# Patient Record
Sex: Female | Born: 1950
Health system: Southern US, Community
[De-identification: ages and names within clinical notes are randomized; demographics above are authoritative.]

## PROBLEM LIST (undated history)

## (undated) DIAGNOSIS — I10 Essential (primary) hypertension: Secondary | ICD-10-CM

## (undated) DIAGNOSIS — I350 Nonrheumatic aortic (valve) stenosis: Secondary | ICD-10-CM

## (undated) DIAGNOSIS — E785 Hyperlipidemia, unspecified: Secondary | ICD-10-CM

## (undated) DIAGNOSIS — I509 Heart failure, unspecified: Secondary | ICD-10-CM

## (undated) DIAGNOSIS — E119 Type 2 diabetes mellitus without complications: Secondary | ICD-10-CM

## (undated) HISTORY — DX: Hyperlipidemia, unspecified: E78.5

## (undated) HISTORY — PX: CATARACT EXTRACTION: SUR2

## (undated) HISTORY — DX: Nonrheumatic aortic (valve) stenosis: I35.0

## (undated) HISTORY — DX: Heart failure, unspecified: I50.9

---

## 1983-05-30 HISTORY — PX: TUBAL LIGATION: SHX77

## 2007-05-17 ENCOUNTER — Other Ambulatory Visit: Admission: RE | Admit: 2007-05-17 | Discharge: 2007-05-17 | Payer: Self-pay | Admitting: Family Medicine

## 2007-05-17 ENCOUNTER — Encounter: Admission: RE | Admit: 2007-05-17 | Discharge: 2007-05-28 | Payer: Self-pay | Admitting: Family Medicine

## 2007-05-31 ENCOUNTER — Encounter: Admission: RE | Admit: 2007-05-31 | Discharge: 2007-07-09 | Payer: Self-pay | Admitting: Family Medicine

## 2016-09-07 DIAGNOSIS — E113411 Type 2 diabetes mellitus with severe nonproliferative diabetic retinopathy with macular edema, right eye: Secondary | ICD-10-CM | POA: Diagnosis not present

## 2016-09-07 DIAGNOSIS — E113412 Type 2 diabetes mellitus with severe nonproliferative diabetic retinopathy with macular edema, left eye: Secondary | ICD-10-CM | POA: Diagnosis not present

## 2016-09-28 DIAGNOSIS — E113411 Type 2 diabetes mellitus with severe nonproliferative diabetic retinopathy with macular edema, right eye: Secondary | ICD-10-CM | POA: Diagnosis not present

## 2016-10-11 DIAGNOSIS — R51 Headache: Secondary | ICD-10-CM | POA: Diagnosis not present

## 2016-10-11 DIAGNOSIS — H5711 Ocular pain, right eye: Secondary | ICD-10-CM | POA: Diagnosis not present

## 2016-10-19 DIAGNOSIS — E113412 Type 2 diabetes mellitus with severe nonproliferative diabetic retinopathy with macular edema, left eye: Secondary | ICD-10-CM | POA: Diagnosis not present

## 2016-11-02 DIAGNOSIS — E113411 Type 2 diabetes mellitus with severe nonproliferative diabetic retinopathy with macular edema, right eye: Secondary | ICD-10-CM | POA: Diagnosis not present

## 2016-11-15 DIAGNOSIS — E1165 Type 2 diabetes mellitus with hyperglycemia: Secondary | ICD-10-CM | POA: Diagnosis not present

## 2016-11-15 DIAGNOSIS — I38 Endocarditis, valve unspecified: Secondary | ICD-10-CM | POA: Diagnosis not present

## 2016-11-15 DIAGNOSIS — E1142 Type 2 diabetes mellitus with diabetic polyneuropathy: Secondary | ICD-10-CM | POA: Diagnosis not present

## 2016-11-15 DIAGNOSIS — E7800 Pure hypercholesterolemia, unspecified: Secondary | ICD-10-CM | POA: Diagnosis not present

## 2016-11-22 DIAGNOSIS — E113412 Type 2 diabetes mellitus with severe nonproliferative diabetic retinopathy with macular edema, left eye: Secondary | ICD-10-CM | POA: Diagnosis not present

## 2016-11-22 DIAGNOSIS — E113411 Type 2 diabetes mellitus with severe nonproliferative diabetic retinopathy with macular edema, right eye: Secondary | ICD-10-CM | POA: Diagnosis not present

## 2016-12-12 DIAGNOSIS — E113411 Type 2 diabetes mellitus with severe nonproliferative diabetic retinopathy with macular edema, right eye: Secondary | ICD-10-CM | POA: Diagnosis not present

## 2017-01-04 DIAGNOSIS — H34812 Central retinal vein occlusion, left eye, with macular edema: Secondary | ICD-10-CM | POA: Diagnosis not present

## 2017-01-18 DIAGNOSIS — H40059 Ocular hypertension, unspecified eye: Secondary | ICD-10-CM | POA: Diagnosis not present

## 2017-01-18 DIAGNOSIS — E113411 Type 2 diabetes mellitus with severe nonproliferative diabetic retinopathy with macular edema, right eye: Secondary | ICD-10-CM | POA: Diagnosis not present

## 2017-01-18 DIAGNOSIS — E113412 Type 2 diabetes mellitus with severe nonproliferative diabetic retinopathy with macular edema, left eye: Secondary | ICD-10-CM | POA: Diagnosis not present

## 2017-01-25 DIAGNOSIS — E1165 Type 2 diabetes mellitus with hyperglycemia: Secondary | ICD-10-CM | POA: Diagnosis not present

## 2017-01-25 DIAGNOSIS — E7800 Pure hypercholesterolemia, unspecified: Secondary | ICD-10-CM | POA: Diagnosis not present

## 2017-01-25 DIAGNOSIS — I38 Endocarditis, valve unspecified: Secondary | ICD-10-CM | POA: Diagnosis not present

## 2017-01-25 DIAGNOSIS — Z7984 Long term (current) use of oral hypoglycemic drugs: Secondary | ICD-10-CM | POA: Diagnosis not present

## 2017-01-25 DIAGNOSIS — E1142 Type 2 diabetes mellitus with diabetic polyneuropathy: Secondary | ICD-10-CM | POA: Diagnosis not present

## 2017-01-30 DIAGNOSIS — H40052 Ocular hypertension, left eye: Secondary | ICD-10-CM | POA: Diagnosis not present

## 2017-02-05 DIAGNOSIS — E113412 Type 2 diabetes mellitus with severe nonproliferative diabetic retinopathy with macular edema, left eye: Secondary | ICD-10-CM | POA: Diagnosis not present

## 2017-03-08 DIAGNOSIS — E113411 Type 2 diabetes mellitus with severe nonproliferative diabetic retinopathy with macular edema, right eye: Secondary | ICD-10-CM | POA: Diagnosis not present

## 2017-03-29 DIAGNOSIS — E113412 Type 2 diabetes mellitus with severe nonproliferative diabetic retinopathy with macular edema, left eye: Secondary | ICD-10-CM | POA: Diagnosis not present

## 2017-04-26 DIAGNOSIS — E113411 Type 2 diabetes mellitus with severe nonproliferative diabetic retinopathy with macular edema, right eye: Secondary | ICD-10-CM | POA: Diagnosis not present

## 2017-05-01 DIAGNOSIS — E113412 Type 2 diabetes mellitus with severe nonproliferative diabetic retinopathy with macular edema, left eye: Secondary | ICD-10-CM | POA: Diagnosis not present

## 2017-05-02 ENCOUNTER — Other Ambulatory Visit: Payer: Self-pay | Admitting: Family Medicine

## 2017-05-02 DIAGNOSIS — I38 Endocarditis, valve unspecified: Secondary | ICD-10-CM | POA: Diagnosis not present

## 2017-05-02 DIAGNOSIS — Z23 Encounter for immunization: Secondary | ICD-10-CM | POA: Diagnosis not present

## 2017-05-02 DIAGNOSIS — E1142 Type 2 diabetes mellitus with diabetic polyneuropathy: Secondary | ICD-10-CM | POA: Diagnosis not present

## 2017-05-02 DIAGNOSIS — R011 Cardiac murmur, unspecified: Secondary | ICD-10-CM

## 2017-05-02 DIAGNOSIS — E119 Type 2 diabetes mellitus without complications: Secondary | ICD-10-CM | POA: Diagnosis not present

## 2017-06-07 DIAGNOSIS — E113412 Type 2 diabetes mellitus with severe nonproliferative diabetic retinopathy with macular edema, left eye: Secondary | ICD-10-CM | POA: Diagnosis not present

## 2017-06-13 DIAGNOSIS — E113411 Type 2 diabetes mellitus with severe nonproliferative diabetic retinopathy with macular edema, right eye: Secondary | ICD-10-CM | POA: Diagnosis not present

## 2017-06-20 ENCOUNTER — Other Ambulatory Visit: Payer: Self-pay

## 2017-06-20 ENCOUNTER — Ambulatory Visit (HOSPITAL_COMMUNITY): Payer: Medicare HMO | Attending: Cardiovascular Disease

## 2017-06-20 DIAGNOSIS — R011 Cardiac murmur, unspecified: Secondary | ICD-10-CM | POA: Diagnosis not present

## 2017-06-20 DIAGNOSIS — I340 Nonrheumatic mitral (valve) insufficiency: Secondary | ICD-10-CM | POA: Insufficient documentation

## 2017-07-10 DIAGNOSIS — E113412 Type 2 diabetes mellitus with severe nonproliferative diabetic retinopathy with macular edema, left eye: Secondary | ICD-10-CM | POA: Diagnosis not present

## 2017-07-17 DIAGNOSIS — E113411 Type 2 diabetes mellitus with severe nonproliferative diabetic retinopathy with macular edema, right eye: Secondary | ICD-10-CM | POA: Diagnosis not present

## 2017-08-08 DIAGNOSIS — E113412 Type 2 diabetes mellitus with severe nonproliferative diabetic retinopathy with macular edema, left eye: Secondary | ICD-10-CM | POA: Diagnosis not present

## 2017-08-15 DIAGNOSIS — E113411 Type 2 diabetes mellitus with severe nonproliferative diabetic retinopathy with macular edema, right eye: Secondary | ICD-10-CM | POA: Diagnosis not present

## 2017-09-28 DIAGNOSIS — E113412 Type 2 diabetes mellitus with severe nonproliferative diabetic retinopathy with macular edema, left eye: Secondary | ICD-10-CM | POA: Diagnosis not present

## 2017-10-17 DIAGNOSIS — E113511 Type 2 diabetes mellitus with proliferative diabetic retinopathy with macular edema, right eye: Secondary | ICD-10-CM | POA: Diagnosis not present

## 2017-10-31 DIAGNOSIS — E113512 Type 2 diabetes mellitus with proliferative diabetic retinopathy with macular edema, left eye: Secondary | ICD-10-CM | POA: Diagnosis not present

## 2017-11-08 ENCOUNTER — Other Ambulatory Visit: Payer: Self-pay | Admitting: Family Medicine

## 2017-11-08 DIAGNOSIS — E7800 Pure hypercholesterolemia, unspecified: Secondary | ICD-10-CM | POA: Diagnosis not present

## 2017-11-08 DIAGNOSIS — R030 Elevated blood-pressure reading, without diagnosis of hypertension: Secondary | ICD-10-CM | POA: Diagnosis not present

## 2017-11-08 DIAGNOSIS — Z794 Long term (current) use of insulin: Secondary | ICD-10-CM | POA: Diagnosis not present

## 2017-11-08 DIAGNOSIS — Z23 Encounter for immunization: Secondary | ICD-10-CM | POA: Diagnosis not present

## 2017-11-08 DIAGNOSIS — E119 Type 2 diabetes mellitus without complications: Secondary | ICD-10-CM | POA: Diagnosis not present

## 2017-11-08 DIAGNOSIS — Z7984 Long term (current) use of oral hypoglycemic drugs: Secondary | ICD-10-CM | POA: Diagnosis not present

## 2017-11-08 DIAGNOSIS — Z1211 Encounter for screening for malignant neoplasm of colon: Secondary | ICD-10-CM | POA: Diagnosis not present

## 2017-11-08 DIAGNOSIS — E1142 Type 2 diabetes mellitus with diabetic polyneuropathy: Secondary | ICD-10-CM | POA: Diagnosis not present

## 2017-11-08 DIAGNOSIS — Z1231 Encounter for screening mammogram for malignant neoplasm of breast: Secondary | ICD-10-CM

## 2017-11-08 DIAGNOSIS — Z1239 Encounter for other screening for malignant neoplasm of breast: Secondary | ICD-10-CM | POA: Diagnosis not present

## 2017-11-22 DIAGNOSIS — E113511 Type 2 diabetes mellitus with proliferative diabetic retinopathy with macular edema, right eye: Secondary | ICD-10-CM | POA: Diagnosis not present

## 2017-12-04 DIAGNOSIS — E113512 Type 2 diabetes mellitus with proliferative diabetic retinopathy with macular edema, left eye: Secondary | ICD-10-CM | POA: Diagnosis not present

## 2017-12-11 DIAGNOSIS — E871 Hypo-osmolality and hyponatremia: Secondary | ICD-10-CM | POA: Diagnosis not present

## 2017-12-25 DIAGNOSIS — E113511 Type 2 diabetes mellitus with proliferative diabetic retinopathy with macular edema, right eye: Secondary | ICD-10-CM | POA: Diagnosis not present

## 2018-01-04 DIAGNOSIS — E113512 Type 2 diabetes mellitus with proliferative diabetic retinopathy with macular edema, left eye: Secondary | ICD-10-CM | POA: Diagnosis not present

## 2018-01-17 ENCOUNTER — Encounter: Payer: Self-pay | Admitting: Radiology

## 2018-01-17 ENCOUNTER — Ambulatory Visit
Admission: RE | Admit: 2018-01-17 | Discharge: 2018-01-17 | Disposition: A | Discharge status: Home / Self Care | Payer: Medicare HMO | Source: Ambulatory Visit | Attending: Family Medicine | Admitting: Family Medicine

## 2018-01-17 DIAGNOSIS — Z1231 Encounter for screening mammogram for malignant neoplasm of breast: Secondary | ICD-10-CM | POA: Diagnosis not present

## 2018-01-23 DIAGNOSIS — Z1211 Encounter for screening for malignant neoplasm of colon: Secondary | ICD-10-CM | POA: Diagnosis not present

## 2018-01-23 DIAGNOSIS — D126 Benign neoplasm of colon, unspecified: Secondary | ICD-10-CM | POA: Diagnosis not present

## 2018-01-23 DIAGNOSIS — K635 Polyp of colon: Secondary | ICD-10-CM | POA: Diagnosis not present

## 2018-01-25 DIAGNOSIS — D126 Benign neoplasm of colon, unspecified: Secondary | ICD-10-CM | POA: Diagnosis not present

## 2018-01-25 DIAGNOSIS — Z1211 Encounter for screening for malignant neoplasm of colon: Secondary | ICD-10-CM | POA: Diagnosis not present

## 2018-02-05 DIAGNOSIS — E113512 Type 2 diabetes mellitus with proliferative diabetic retinopathy with macular edema, left eye: Secondary | ICD-10-CM | POA: Diagnosis not present

## 2018-02-12 DIAGNOSIS — E113511 Type 2 diabetes mellitus with proliferative diabetic retinopathy with macular edema, right eye: Secondary | ICD-10-CM | POA: Diagnosis not present

## 2018-03-12 DIAGNOSIS — E113512 Type 2 diabetes mellitus with proliferative diabetic retinopathy with macular edema, left eye: Secondary | ICD-10-CM | POA: Diagnosis not present

## 2018-04-03 DIAGNOSIS — E113511 Type 2 diabetes mellitus with proliferative diabetic retinopathy with macular edema, right eye: Secondary | ICD-10-CM | POA: Diagnosis not present

## 2018-04-22 DIAGNOSIS — E113512 Type 2 diabetes mellitus with proliferative diabetic retinopathy with macular edema, left eye: Secondary | ICD-10-CM | POA: Diagnosis not present

## 2018-05-14 DIAGNOSIS — H524 Presbyopia: Secondary | ICD-10-CM | POA: Diagnosis not present

## 2018-05-14 DIAGNOSIS — H04123 Dry eye syndrome of bilateral lacrimal glands: Secondary | ICD-10-CM | POA: Diagnosis not present

## 2018-05-14 DIAGNOSIS — H40053 Ocular hypertension, bilateral: Secondary | ICD-10-CM | POA: Diagnosis not present

## 2018-05-14 DIAGNOSIS — H2513 Age-related nuclear cataract, bilateral: Secondary | ICD-10-CM | POA: Diagnosis not present

## 2018-05-16 DIAGNOSIS — E7800 Pure hypercholesterolemia, unspecified: Secondary | ICD-10-CM | POA: Diagnosis not present

## 2018-05-16 DIAGNOSIS — E11311 Type 2 diabetes mellitus with unspecified diabetic retinopathy with macular edema: Secondary | ICD-10-CM | POA: Diagnosis not present

## 2018-05-16 DIAGNOSIS — I10 Essential (primary) hypertension: Secondary | ICD-10-CM | POA: Diagnosis not present

## 2018-05-16 DIAGNOSIS — Z794 Long term (current) use of insulin: Secondary | ICD-10-CM | POA: Diagnosis not present

## 2018-05-16 DIAGNOSIS — R05 Cough: Secondary | ICD-10-CM | POA: Diagnosis not present

## 2018-05-16 DIAGNOSIS — E1142 Type 2 diabetes mellitus with diabetic polyneuropathy: Secondary | ICD-10-CM | POA: Diagnosis not present

## 2018-05-16 DIAGNOSIS — E1139 Type 2 diabetes mellitus with other diabetic ophthalmic complication: Secondary | ICD-10-CM | POA: Diagnosis not present

## 2018-05-16 DIAGNOSIS — E1149 Type 2 diabetes mellitus with other diabetic neurological complication: Secondary | ICD-10-CM | POA: Diagnosis not present

## 2018-05-24 DIAGNOSIS — E113511 Type 2 diabetes mellitus with proliferative diabetic retinopathy with macular edema, right eye: Secondary | ICD-10-CM | POA: Diagnosis not present

## 2018-06-04 DIAGNOSIS — E113512 Type 2 diabetes mellitus with proliferative diabetic retinopathy with macular edema, left eye: Secondary | ICD-10-CM | POA: Diagnosis not present

## 2018-06-25 DIAGNOSIS — H2513 Age-related nuclear cataract, bilateral: Secondary | ICD-10-CM | POA: Diagnosis not present

## 2018-06-25 DIAGNOSIS — H40053 Ocular hypertension, bilateral: Secondary | ICD-10-CM | POA: Diagnosis not present

## 2018-06-25 DIAGNOSIS — Z0100 Encounter for examination of eyes and vision without abnormal findings: Secondary | ICD-10-CM | POA: Diagnosis not present

## 2018-06-25 DIAGNOSIS — H04123 Dry eye syndrome of bilateral lacrimal glands: Secondary | ICD-10-CM | POA: Diagnosis not present

## 2018-06-26 DIAGNOSIS — E113511 Type 2 diabetes mellitus with proliferative diabetic retinopathy with macular edema, right eye: Secondary | ICD-10-CM | POA: Diagnosis not present

## 2018-07-01 DIAGNOSIS — Z0100 Encounter for examination of eyes and vision without abnormal findings: Secondary | ICD-10-CM | POA: Diagnosis not present

## 2018-07-11 DIAGNOSIS — E113512 Type 2 diabetes mellitus with proliferative diabetic retinopathy with macular edema, left eye: Secondary | ICD-10-CM | POA: Diagnosis not present

## 2018-08-08 DIAGNOSIS — E113511 Type 2 diabetes mellitus with proliferative diabetic retinopathy with macular edema, right eye: Secondary | ICD-10-CM | POA: Diagnosis not present

## 2018-08-13 DIAGNOSIS — E113512 Type 2 diabetes mellitus with proliferative diabetic retinopathy with macular edema, left eye: Secondary | ICD-10-CM | POA: Diagnosis not present

## 2018-09-17 DIAGNOSIS — E113512 Type 2 diabetes mellitus with proliferative diabetic retinopathy with macular edema, left eye: Secondary | ICD-10-CM | POA: Diagnosis not present

## 2018-09-25 DIAGNOSIS — E113511 Type 2 diabetes mellitus with proliferative diabetic retinopathy with macular edema, right eye: Secondary | ICD-10-CM | POA: Diagnosis not present

## 2018-10-08 DIAGNOSIS — Z794 Long term (current) use of insulin: Secondary | ICD-10-CM | POA: Diagnosis not present

## 2018-10-08 DIAGNOSIS — I10 Essential (primary) hypertension: Secondary | ICD-10-CM | POA: Diagnosis not present

## 2018-10-08 DIAGNOSIS — E11311 Type 2 diabetes mellitus with unspecified diabetic retinopathy with macular edema: Secondary | ICD-10-CM | POA: Diagnosis not present

## 2018-10-08 DIAGNOSIS — E1149 Type 2 diabetes mellitus with other diabetic neurological complication: Secondary | ICD-10-CM | POA: Diagnosis not present

## 2018-10-08 DIAGNOSIS — E1139 Type 2 diabetes mellitus with other diabetic ophthalmic complication: Secondary | ICD-10-CM | POA: Diagnosis not present

## 2018-10-08 DIAGNOSIS — Z13820 Encounter for screening for osteoporosis: Secondary | ICD-10-CM | POA: Diagnosis not present

## 2018-10-08 DIAGNOSIS — E7800 Pure hypercholesterolemia, unspecified: Secondary | ICD-10-CM | POA: Diagnosis not present

## 2018-10-08 DIAGNOSIS — Z0000 Encounter for general adult medical examination without abnormal findings: Secondary | ICD-10-CM | POA: Diagnosis not present

## 2018-10-08 DIAGNOSIS — E1142 Type 2 diabetes mellitus with diabetic polyneuropathy: Secondary | ICD-10-CM | POA: Diagnosis not present

## 2018-10-14 DIAGNOSIS — I10 Essential (primary) hypertension: Secondary | ICD-10-CM | POA: Diagnosis not present

## 2018-10-14 DIAGNOSIS — E1139 Type 2 diabetes mellitus with other diabetic ophthalmic complication: Secondary | ICD-10-CM | POA: Diagnosis not present

## 2018-10-14 DIAGNOSIS — E7800 Pure hypercholesterolemia, unspecified: Secondary | ICD-10-CM | POA: Diagnosis not present

## 2018-10-31 DIAGNOSIS — E113512 Type 2 diabetes mellitus with proliferative diabetic retinopathy with macular edema, left eye: Secondary | ICD-10-CM | POA: Diagnosis not present

## 2018-11-20 DIAGNOSIS — E113511 Type 2 diabetes mellitus with proliferative diabetic retinopathy with macular edema, right eye: Secondary | ICD-10-CM | POA: Diagnosis not present

## 2018-12-11 ENCOUNTER — Other Ambulatory Visit: Payer: Self-pay | Admitting: Family Medicine

## 2018-12-11 DIAGNOSIS — Z1231 Encounter for screening mammogram for malignant neoplasm of breast: Secondary | ICD-10-CM

## 2018-12-16 ENCOUNTER — Other Ambulatory Visit: Payer: Self-pay | Admitting: Family Medicine

## 2018-12-16 DIAGNOSIS — E2839 Other primary ovarian failure: Secondary | ICD-10-CM

## 2018-12-16 DIAGNOSIS — Z13820 Encounter for screening for osteoporosis: Secondary | ICD-10-CM

## 2018-12-25 DIAGNOSIS — E113512 Type 2 diabetes mellitus with proliferative diabetic retinopathy with macular edema, left eye: Secondary | ICD-10-CM | POA: Diagnosis not present

## 2019-01-22 DIAGNOSIS — E113511 Type 2 diabetes mellitus with proliferative diabetic retinopathy with macular edema, right eye: Secondary | ICD-10-CM | POA: Diagnosis not present

## 2019-02-08 DIAGNOSIS — Z23 Encounter for immunization: Secondary | ICD-10-CM | POA: Diagnosis not present

## 2019-02-20 DIAGNOSIS — E113512 Type 2 diabetes mellitus with proliferative diabetic retinopathy with macular edema, left eye: Secondary | ICD-10-CM | POA: Diagnosis not present

## 2019-03-05 ENCOUNTER — Emergency Department (HOSPITAL_COMMUNITY): Payer: Medicare HMO

## 2019-03-05 ENCOUNTER — Encounter (HOSPITAL_COMMUNITY): Payer: Self-pay

## 2019-03-05 ENCOUNTER — Emergency Department (HOSPITAL_COMMUNITY)
Admission: EM | Admit: 2019-03-05 | Discharge: 2019-03-05 | Discharge status: Against Medical Advice | Payer: Medicare HMO | Attending: Emergency Medicine | Admitting: Emergency Medicine

## 2019-03-05 ENCOUNTER — Other Ambulatory Visit: Payer: Self-pay

## 2019-03-05 DIAGNOSIS — E119 Type 2 diabetes mellitus without complications: Secondary | ICD-10-CM | POA: Diagnosis not present

## 2019-03-05 DIAGNOSIS — I110 Hypertensive heart disease with heart failure: Secondary | ICD-10-CM | POA: Diagnosis not present

## 2019-03-05 DIAGNOSIS — I10 Essential (primary) hypertension: Secondary | ICD-10-CM | POA: Diagnosis not present

## 2019-03-05 DIAGNOSIS — I509 Heart failure, unspecified: Secondary | ICD-10-CM | POA: Diagnosis not present

## 2019-03-05 DIAGNOSIS — Z7984 Long term (current) use of oral hypoglycemic drugs: Secondary | ICD-10-CM | POA: Insufficient documentation

## 2019-03-05 DIAGNOSIS — J90 Pleural effusion, not elsewhere classified: Secondary | ICD-10-CM | POA: Diagnosis not present

## 2019-03-05 DIAGNOSIS — R0602 Shortness of breath: Secondary | ICD-10-CM | POA: Diagnosis not present

## 2019-03-05 DIAGNOSIS — Z79899 Other long term (current) drug therapy: Secondary | ICD-10-CM | POA: Insufficient documentation

## 2019-03-05 DIAGNOSIS — R0600 Dyspnea, unspecified: Secondary | ICD-10-CM

## 2019-03-05 HISTORY — DX: Essential (primary) hypertension: I10

## 2019-03-05 HISTORY — DX: Type 2 diabetes mellitus without complications: E11.9

## 2019-03-05 LAB — TROPONIN I (HIGH SENSITIVITY)
Troponin I (High Sensitivity): 13 ng/L (ref <18)
Troponin I (High Sensitivity): 10 ng/L (ref <18)

## 2019-03-05 LAB — BASIC METABOLIC PANEL
Anion gap: 9 (ref 5–15)
BUN: 18 mg/dL (ref 8–23)
CO2: 19 mmol/L — ABNORMAL LOW (ref 22–32)
Calcium: 8.9 mg/dL (ref 8.9–10.3)
Chloride: 97 mmol/L — ABNORMAL LOW (ref 98–111)
Creatinine, Ser: 1.05 mg/dL — ABNORMAL HIGH (ref 0.44–1.00)
GFR calc Af Amer: >60 mL/min (ref >60)
GFR calc non Af Amer: 55 mL/min — ABNORMAL LOW (ref >60)
Glucose, Bld: 206 mg/dL — ABNORMAL HIGH (ref 70–99)
Potassium: 4.8 mmol/L (ref 3.5–5.1)
Sodium: 125 mmol/L — ABNORMAL LOW (ref 135–145)

## 2019-03-05 LAB — CBC
HCT: 30.1 % — ABNORMAL LOW (ref 36.0–46.0)
Hemoglobin: 10.4 g/dL — ABNORMAL LOW (ref 12.0–15.0)
MCH: 31.3 pg (ref 26.0–34.0)
MCHC: 34.6 g/dL (ref 30.0–36.0)
MCV: 90.7 fL (ref 80.0–100.0)
Platelets: 239 10*3/uL (ref 150–400)
RBC: 3.32 MIL/uL — ABNORMAL LOW (ref 3.87–5.11)
RDW: 12.5 % (ref 11.5–15.5)
WBC: 10.5 10*3/uL (ref 4.0–10.5)
nRBC: 0 % (ref 0.0–0.2)

## 2019-03-05 LAB — CBG MONITORING, ED: Glucose-Capillary: 195 mg/dL — ABNORMAL HIGH (ref 70–99)

## 2019-03-05 LAB — D-DIMER, QUANTITATIVE: D-Dimer, Quant: 0.78 ug/mL-FEU — ABNORMAL HIGH (ref 0.00–0.50)

## 2019-03-05 LAB — BRAIN NATRIURETIC PEPTIDE: B Natriuretic Peptide: 393.5 pg/mL — ABNORMAL HIGH (ref 0.0–100.0)

## 2019-03-05 MED ORDER — FUROSEMIDE 10 MG/ML IJ SOLN
20.0000 mg | Freq: Once | INTRAMUSCULAR | Status: AC
  Administered 2019-03-05: 20 mg via INTRAVENOUS
  Filled 2019-03-05: qty 2

## 2019-03-05 MED ORDER — IOHEXOL 350 MG/ML SOLN
75.0000 mL | Freq: Once | INTRAVENOUS | Status: AC | PRN
  Administered 2019-03-05: 16:00:00 75 mL via INTRAVENOUS

## 2019-03-05 MED ORDER — BECLOMETHASONE DIPROPIONATE 40 MCG/ACT IN AERS: 2.0000 | INHALATION_SPRAY | Freq: Two times a day (BID) | RESPIRATORY_TRACT | 12 refills | Status: DC

## 2019-03-05 MED ORDER — SODIUM CHLORIDE 0.9% FLUSH: 3.0000 mL | Freq: Once | INTRAVENOUS | Status: DC

## 2019-03-05 MED ORDER — POTASSIUM CHLORIDE CRYS ER 20 MEQ PO TBCR
20.0000 meq | EXTENDED_RELEASE_TABLET | Freq: Once | ORAL | Status: AC
  Administered 2019-03-05: 20 meq via ORAL
  Filled 2019-03-05: qty 1

## 2019-03-05 MED ORDER — AZITHROMYCIN 250 MG PO TABS: ORAL_TABLET | ORAL | 0 refills | Status: DC

## 2019-03-05 MED ORDER — POTASSIUM CHLORIDE ER 10 MEQ PO TBCR: 10.0000 meq | EXTENDED_RELEASE_TABLET | Freq: Every day | ORAL | 0 refills | Status: DC

## 2019-03-05 MED ORDER — FUROSEMIDE 20 MG PO TABS: 20.0000 mg | ORAL_TABLET | Freq: Every day | ORAL | 0 refills | Status: DC

## 2019-03-05 NOTE — ED Provider Notes (Cosign Needed)
[place] EMERGENCY DEPARTMENT Provider Note   CSN: [id number] Arrival date & time: 03/05/19  1041     History   Chief Complaint No chief complaint on file.   HPI Danielle Beck is a 68 y.o. female.     Patient is a 68 year old female with past medical history of hypertension, diabetes who presents to the emergency department for 2 days of palpitations, shortness of breath, wheezing.  Patient reports that this started 2 days ago.  Reports that this does get worse at night to where she feels like her heart is racing and last night had some wheezing.  Reports that she woke up this morning with some palpitations and decided to come to the emergency department.  She did take all of her morning medications.  Shortness of breath is also worse with exertion.  She has some leg edema at baseline but reports it is not any worse than usual.  She has not tried anything for relief.  Denies any chest pain currently but does report that she felt some tightness in her chest over the last couple of days.  She also reports an itching rash to her extremities and torso.  This began after a trip to the beach about a month ago.  Reports that it waxes and wanes.  Denies any new soaps, lotions, detergents, fever, chills, cough     Past Medical History:  Diagnosis Date   Diabetes mellitus without complication ([place])    Hypertension     There are no active problems to display for this patient.   History reviewed. No pertinent surgical history.   OB History   No obstetric history on file.      Home Medications    Prior to Admission medications   Medication Sig Start Date End Date Taking? Authorizing Provider  amLODipine (NORVASC) 5 MG tablet Take 5 mg by mouth daily. 01/26/19  Yes [provider]  atorvastatin (LIPITOR) 10 MG tablet Take 10 mg by mouth daily. 12/31/18  Yes [provider]  calcium-vitamin D (OSCAL WITH D) 250-125 MG-UNIT tablet Take 1 tablet  by mouth daily.   Yes [provider]  dorzolamide (TRUSOPT) 2 % ophthalmic solution Place 1 drop into the left eye 2 (two) times daily. 12/25/18  Yes [provider]  gabapentin (NEURONTIN) 400 MG capsule Take 400 mg by mouth 3 (three) times daily. 01/05/19  Yes [provider]  LANTUS SOLOSTAR 100 UNIT/ML Solostar Pen Inject 30 Units into the skin daily. 02/06/19  Yes [provider]  latanoprost (XALATAN) 0.005 % ophthalmic solution Place 1 drop into both eyes at bedtime.  12/25/18  Yes [provider]  lisinopril-hydrochlorothiazide (ZESTORETIC) 20-12.5 MG tablet Take 1 tablet by mouth daily. 12/26/18  Yes [provider]  metFORMIN (GLUCOPHAGE) 1000 MG tablet Take 1,000 mg by mouth 2 (two) times daily. 01/05/19  Yes [provider]  pyridOXINE (VITAMIN B-6) 100 MG tablet Take 100 mg by mouth daily.   Yes [provider]  thiamine (VITAMIN B-1) 100 MG tablet Take 100 mg by mouth daily.   Yes [provider]  timolol (TIMOPTIC) 0.5 % ophthalmic solution Place 1 drop into the left eye 2 (two) times daily. 12/26/18  Yes [provider]  vitamin B-12 (CYANOCOBALAMIN) 100 MCG tablet Take 100 mcg by mouth daily.   Yes [provider]  azithromycin (ZITHROMAX) 250 MG tablet Zpack instructions. 500mg  day 1and 250mg  each day after until gone 03/05/19   Aundra Dubin,  Nitza Schmid A, PA-C  beclomethasone (QVAR) 40 MCG/ACT inhaler Inhale 2 puffs into the lungs 2 (two) times daily. 03/05/19   Alveria Apley, PA-C    Family History No family history on file.  Social History Social History   Tobacco Use   Smoking status: Not on file  Substance Use Topics   Alcohol use: Not on file   Drug use: Not on file     Allergies   Patient has no known allergies.   Review of Systems Review of Systems  Constitutional: Negative for chills and fever.  HENT: Negative for congestion, rhinorrhea and sore throat.   Respiratory:  Positive for shortness of breath and wheezing. Negative for cough.   Cardiovascular: Positive for chest pain and palpitations. Negative for leg swelling.  Gastrointestinal: Negative for abdominal pain, diarrhea, nausea and vomiting.  Genitourinary: Negative for dysuria.  Musculoskeletal: Negative for arthralgias, back pain, myalgias, neck pain and neck stiffness.  Skin: Positive for rash (for 1 month).  Neurological: Negative for dizziness, weakness and light-headedness.     Physical Exam Updated Vital Signs BP (!) 152/76    Pulse 82    Temp 98.5 F (36.9 C) (Oral)    Resp 17    SpO2 98%   Physical Exam Vitals signs and nursing note reviewed. Exam conducted with a chaperone present.  Constitutional:      General: She is not in acute distress.    Appearance: Normal appearance. She is obese. She is not ill-appearing, toxic-appearing or diaphoretic.  HENT:     Head: Normocephalic and atraumatic.     Nose: Nose normal.     Mouth/Throat:     Mouth: Mucous membranes are moist.  Eyes:     Pupils: Pupils are equal, round, and reactive to light.  Cardiovascular:     Rate and Rhythm: Normal rate and regular rhythm.     Heart sounds: Murmur present.  Pulmonary:     Effort: Pulmonary effort is normal.     Breath sounds: Examination of the right-upper field reveals wheezing. Examination of the left-upper field reveals wheezing. Examination of the right-middle field reveals wheezing and rhonchi. Examination of the left-middle field reveals wheezing and rhonchi. Examination of the right-lower field reveals wheezing and rhonchi. Examination of the left-lower field reveals wheezing and rhonchi. Wheezing and rhonchi present.  Abdominal:     General: Abdomen is flat. Bowel sounds are normal. There is no distension.     Tenderness: There is no abdominal tenderness. There is no guarding.  Musculoskeletal:     Right lower leg: Edema (1+ pitting) present.     Left lower leg: Edema (1+ pitting)  present.  Skin:    General: Skin is warm.     Capillary Refill: Capillary refill takes less than 2 seconds.  Neurological:     General: No focal deficit present.     Mental Status: She is alert.  Psychiatric:        Mood and Affect: Mood normal.      ED Treatments / Results  Labs (all labs ordered are listed, but only abnormal results are displayed) Labs Reviewed  BASIC METABOLIC PANEL - Abnormal; Notable for the following components:      Result Value   Sodium 125 (*)    Chloride 97 (*)    CO2 19 (*)    Glucose, Bld 206 (*)    Creatinine, Ser 1.05 (*)    GFR calc non Af Amer 55 (*)    All other components  within normal limits  CBC - Abnormal; Notable for the following components:   RBC 3.32 (*)    Hemoglobin 10.4 (*)    HCT 30.1 (*)    All other components within normal limits  D-DIMER, QUANTITATIVE (NOT AT [facility]) - Abnormal; Notable for the following components:   D-Dimer, Quant 0.78 (*)    All other components within normal limits  CBG MONITORING, ED - Abnormal; Notable for the following components:   Glucose-Capillary 195 (*)    All other components within normal limits  TROPONIN I (HIGH SENSITIVITY)  TROPONIN I (HIGH SENSITIVITY)    EKG EKG Interpretation  Date/Time:  Wednesday March 05 2019 10:52:35 EDT Ventricular Rate:  85 PR Interval:  188 QRS Duration: 72 QT Interval:  364 QTC Calculation: 433 R Axis:   -2 Text Interpretation:  Normal sinus rhythm Septal infarct , age undetermined Abnormal ECG Confirmed by Madalyn Rob [phone]) on 03/05/2019 2:09:54 PM   Radiology Dg Chest 2 View  Result Date: 03/05/2019 CLINICAL DATA:  Palpitations EXAM: CHEST - 2 VIEW COMPARISON:  None. FINDINGS: Heart size and vascularity normal. Mild airspace disease in the right medial lung base. Left lung clear. Small pleural effusions bilaterally. Moderate to severe compression fracture T11 likely chronic. No priors for comparison. IMPRESSION: Mild right lower lobe airspace  disease could represent scarring atelectasis or pneumonia. Small bilateral effusions. Moderate to severe compression fracture T11 likely chronic. Correlate with symptoms. Electronically Signed   By: Franchot Gallo M.D.   On: 03/05/2019 11:36    Procedures Procedures (including critical care time)  Medications Ordered in ED Medications  sodium chloride flush (NS) 0.9 % injection 3 mL (has no administration in time range)  iohexol (OMNIPAQUE) 350 MG/ML injection 75 mL (75 mLs Intravenous Contrast Given 03/05/19 1628)     Initial Impression / Assessment and Plan / ED Course  I have reviewed the triage vital signs and the nursing notes.  Pertinent labs & imaging results that were available during my care of the patient were reviewed by me and considered in my medical decision making (see chart for details).  Clinical Course as of Mar 05 1643  Wed Mar 05, 2019  1549 68 y/o F with 2 days of SOB, palpitations and wheezing. Patient with normal vitals on exam. Wheezing and rhonchi bilaterally. Chest xray with ? RLL pneumonia vs atelectasis. Patient's husband is at bedside and is a physician. He reports that her labs today are about her baseline. Sodium 125 today and is usually in low 130s. Hemoglobin usually around 11. DDIMER is elevated so CTA was obtained.   [KM]  [id number] Awaiting results of CTA. If negative. Patient will be d/c with zithromax for CAP and inhaled steroid (patient's husband requested inhaled steroid vs PO). If CTA positive for PE, consult for admission. Patient currently stable.    [KM]    Clinical Course User Index [KM] Alveria Apley, PA-C         Final Clinical Impressions(s) / ED Diagnoses   Final diagnoses:  None    ED Discharge Orders         Ordered    beclomethasone (QVAR) 40 MCG/ACT inhaler  2 times daily     03/05/19 1614    azithromycin (ZITHROMAX) 250 MG tablet     03/05/19 1614           Alveria Apley, [place] 03/05/19 1644

## 2019-03-05 NOTE — ED Triage Notes (Signed)
Patient complains of palpitations that started this am when she awoke with SOB. Also has fine red rash to extremities since beach trip 1 month ago. denies CP

## 2019-03-05 NOTE — ED Notes (Signed)
Pt ambulated in the hallway with no assistance, no complaints.

## 2019-03-05 NOTE — ED Notes (Signed)
EDP at bedside  

## 2019-03-05 NOTE — ED Notes (Signed)
Dimer collected and sent.

## 2019-03-05 NOTE — ED Provider Notes (Signed)
16:30: Assumed care of patient from [place] PA-C at change of shift pending CTA.   Please see prior provider note for full H&P.  Briefly patient is a 68 yo female with a hx of DM & HTN who presented to the ED for evaluation of dyspnea & intermittent palpitations. Patient's husband is a retired Engineer, drilling and has been involved in care.   Plan @ change of shift is to follow up on CTA & disposition accordingly.   CTA negative for PE. Mild vascular congestion & edema with small pleural effusions.   Physical Exam:  Heart RRR. Lungs decreased @ the bases.   There are some mild findings of vascular congestion/edema w/ small pleural effusions.  Last echo with grade 1 diastolic dysfunction & preserved EF 1.5 years ago. No known diagnoses of CHF.  --> Will add on BNP & check ambulatory pulse ox  BNP elevated w/ findings of fluid overload on CTA.  Concern for new onset HF, recommendation for admission which patient & her husband have refused, signing out AMA.  Will give one-time dose of IV Lasix with oral potassium and discharge home with a few days worth of oral Lasix and potassium with recommendation to call PCP for follow-up appointment tomorrow.   The patient and her husband and I discussed the nature and purpose, risks and benefits, as well as, the alternatives of treatment. Time was given to allow the opportunity to ask questions and consider options. After the discussion, the patient decided to refuse the offerred treatment- risks/benefits were discussed.. After refusal, I made every reasonable effort to treat them to the best of my ability.  The patient was notified that they may return to the emergency department at any time for further treatment.    Findings and plan of care discussed with supervising physician Dr. Zenia Resides who is in agreement.         Leafy Kindle 03/05/19 1939    Lacretia Leigh, MD 03/06/19 [phone]

## 2019-03-05 NOTE — Discharge Instructions (Addendum)
You were seen in the emergency department today for trouble breathing. We are concerned that you have new onset heart failure.  We discussed and recommended you be admitted to the hospital for continued evaluation and management of this.  You are leaving [place].  We have given you a dose of IV Lasix and oral potassium, the Lasix is to help remove some of the fluid.   We have prescribed you new medication(s) today. Discuss the medications prescribed today with your pharmacist as they can have adverse effects and interactions with your other medicines including over the counter and prescribed medications. Seek medical evaluation if you start to experience new or abnormal symptoms after taking one of these medicines, seek care immediately if you start to experience difficulty breathing, feeling of your throat closing, facial swelling, or rash as these could be indications of a more serious allergic reaction  Your send you home with both Lasix and potassium to start taking tomorrow to help with this problem.  It is essential that you follow-up with your primary care provider first thing tomorrow morning for recheck

## 2019-03-05 NOTE — ED Notes (Signed)
Got patient undress on the monitor patient is resting with call bell in reach and family at bedside 

## 2019-03-05 NOTE — ED Notes (Signed)
Patient transported to CT 

## 2019-03-05 NOTE — ED Notes (Signed)
Patient verbalized understanding of dc instructions, vss, ambulatory with nad.   

## 2019-03-21 ENCOUNTER — Other Ambulatory Visit: Payer: Self-pay

## 2019-03-21 ENCOUNTER — Encounter: Payer: Self-pay | Admitting: Cardiology

## 2019-03-21 ENCOUNTER — Ambulatory Visit (INDEPENDENT_AMBULATORY_CARE_PROVIDER_SITE_OTHER): Payer: Medicare HMO | Admitting: Cardiology

## 2019-03-21 VITALS — BP 178/84 | HR 80 | Ht 63.0 in | Wt 170.0 lb

## 2019-03-21 DIAGNOSIS — Z13220 Encounter for screening for lipoid disorders: Secondary | ICD-10-CM

## 2019-03-21 DIAGNOSIS — E871 Hypo-osmolality and hyponatremia: Secondary | ICD-10-CM

## 2019-03-21 DIAGNOSIS — R0602 Shortness of breath: Secondary | ICD-10-CM

## 2019-03-21 DIAGNOSIS — R7989 Other specified abnormal findings of blood chemistry: Secondary | ICD-10-CM | POA: Insufficient documentation

## 2019-03-21 DIAGNOSIS — D649 Anemia, unspecified: Secondary | ICD-10-CM | POA: Diagnosis not present

## 2019-03-21 DIAGNOSIS — R0609 Other forms of dyspnea: Secondary | ICD-10-CM | POA: Insufficient documentation

## 2019-03-21 DIAGNOSIS — Z01812 Encounter for preprocedural laboratory examination: Secondary | ICD-10-CM | POA: Insufficient documentation

## 2019-03-21 NOTE — Progress Notes (Signed)
Patient referred by Gaynelle Arabian, MD for shortness of breath  Subjective:   Danielle Beck, female    DOB: 1950/09/21, 68 y.o.   MRN: [id number]   Chief Complaint  Patient presents with  . Congestive Heart Failure  . New Patient (Initial Visit)    HPI  68 year old female with hypertension, diabetes mellitus, referred for evaluation of shortness of breath.  Patient is here with her husband, who is a retired Engineer, drilling, and assists with translation. Patient is originally from [place], and [place] is not her first language.  Patient was in [place], ED on 03/05/2019 with complaints of palpitations and shortness of breath.  CTA was negative for PE.  Did show mild vascular congestion and edema with small pleural effusions.  BNP was elevated concerning for congestive heart failure.  Patient was started on Lasix 10 mg daily, further increased to 20 mg daily by her PCP.  Her shortness of breath and leg edema has improved, but not completely resolved yet. She has also has exertional chest heaviness symptoms.    Past Medical History:  Diagnosis Date  . CHF (congestive heart failure) ([place])   . Diabetes mellitus without complication ([place])   . Hypertension      Past Surgical History:  Procedure Laterality Date  . TUBAL LIGATION  1985     Social History   Socioeconomic History  . Marital status: Married    Spouse name: Not on file  . Number of children: Not on file  . Years of education: Not on file  . Highest education level: Not on file  Occupational History  . Not on file  Social Needs  . Financial resource strain: Not on file  . Food insecurity    Worry: Not on file    Inability: Not on file  . Transportation needs    Medical: Not on file    Non-medical: Not on file  Tobacco Use  . Smoking status: Never Smoker  . Smokeless tobacco: Never Used  Substance and Sexual Activity  . Alcohol use: Not Currently  . Drug use: Not on file  . Sexual activity: Not on file   Lifestyle  . Physical activity    Days per week: Not on file    Minutes per session: Not on file  . Stress: Not on file  Relationships  . Social Herbalist on phone: Not on file    Gets together: Not on file    Attends religious service: Not on file    Active member of club or organization: Not on file    Attends meetings of clubs or organizations: Not on file    Relationship status: Not on file  . Intimate partner violence    Fear of current or ex partner: Not on file    Emotionally abused: Not on file    Physically abused: Not on file    Forced sexual activity: Not on file  Other Topics Concern  . Not on file  Social History Narrative  . Not on file     Family History  Problem Relation Age of Onset  . Diabetes Mother   . Heart failure Father      Current Outpatient Medications on File Prior to Visit  Medication Sig Dispense Refill  . atorvastatin (LIPITOR) 10 MG tablet Take 10 mg by mouth daily.    . furosemide (LASIX) 20 MG tablet Take 1 tablet (20 mg total) by mouth daily. 3 tablet 0  .  gabapentin (NEURONTIN) 400 MG capsule Take 400 mg by mouth 3 (three) times daily.    . metFORMIN (GLUCOPHAGE) 1000 MG tablet Take 1,000 mg by mouth 2 (two) times daily.    . potassium chloride (KLOR-CON) 10 MEQ tablet Take 1 tablet (10 mEq total) by mouth daily for 3 days. 3 tablet 0  . amLODipine (NORVASC) 5 MG tablet Take 5 mg by mouth daily.    Marland Kitchen azithromycin (ZITHROMAX) 250 MG tablet Zpack instructions. 500mg  day 1and 250mg  each day after until gone 6 each 0  . beclomethasone (QVAR) 40 MCG/ACT inhaler Inhale 2 puffs into the lungs 2 (two) times daily. 1 Inhaler 12  . calcium-vitamin D (OSCAL WITH D) 250-125 MG-UNIT tablet Take 1 tablet by mouth daily.    . dorzolamide (TRUSOPT) 2 % ophthalmic solution Place 1 drop into the left eye 2 (two) times daily.    Marland Kitchen LANTUS SOLOSTAR 100 UNIT/ML Solostar Pen Inject 30 Units into the skin daily.    Marland Kitchen latanoprost (XALATAN) 0.005 %  ophthalmic solution Place 1 drop into both eyes at bedtime.     Marland Kitchen lisinopril-hydrochlorothiazide (ZESTORETIC) 20-25 MG tablet Take 1 tablet by mouth daily.    Marland Kitchen pyridOXINE (VITAMIN B-6) 100 MG tablet Take 100 mg by mouth daily.    Marland Kitchen thiamine (VITAMIN B-1) 100 MG tablet Take 100 mg by mouth daily.    . timolol (TIMOPTIC) 0.5 % ophthalmic solution Place 1 drop into the left eye 2 (two) times daily.    . vitamin B-12 (CYANOCOBALAMIN) 100 MCG tablet Take 100 mcg by mouth daily.     No current facility-administered medications on file prior to visit.     Cardiovascular studies:  EKG 03/21/2019:  Sinus rhythm 79 bpm.  Normal EKG.   CTA chest 03/05/2019: No evidence of pulmonary embolism. Mild vascular congestion and edema with small pleural effusions. Aortic Atherosclerosis (ICD10-I70.0).  Echocardiogram 06/20/2017: - Left ventricle: The cavity size was normal. Wall thickness was   increased in a pattern of moderate LVH. Systolic function was   vigorous. The estimated ejection fraction was in the range of 65%   to 70%. Wall motion was normal; there were no regional wall   motion abnormalities. Doppler parameters are consistent with   abnormal left ventricular relaxation (grade 1 diastolic   dysfunction). - Aortic valve: Mildly to moderately calcified annulus. Moderately   thickened, mildly calcified leaflets. - Mitral valve: Mildly calcified annulus. There was mild   regurgitation.   Recent labs: Results for CULA, JASS (MRN [id number]) as of 03/21/2019 09:40  Ref. Range 03/05/2019 10:58 03/05/2019 14:50 03/05/2019 18:00  Sodium Latest Ref Range: 135 - 145 mmol/L 125 (L)    Potassium Latest Ref Range: 3.5 - 5.1 mmol/L 4.8    Chloride Latest Ref Range: 98 - 111 mmol/L 97 (L)    CO2 Latest Ref Range: 22 - 32 mmol/L 19 (L)    Glucose Latest Ref Range: 70 - 99 mg/dL 206 (H)    BUN Latest Ref Range: 8 - 23 mg/dL 18    Creatinine Latest Ref Range: 0.44 - 1.00 mg/dL 1.05 (H)     Calcium Latest Ref Range: 8.9 - 10.3 mg/dL 8.9    Anion gap Latest Ref Range: 5 - 15  9    GFR, Est Non African American Latest Ref Range: >60 mL/min 55 (L)    GFR, Est African American Latest Ref Range: >60 mL/min >60    B Natriuretic Peptide Latest Ref Range: 0.0 - 100.0  pg/mL   393.5 (H)  Troponin I (High Sensitivity) Latest Ref Range: <18 ng/L 13 10    Results for SHIVANI, CALISTO (MRN [id number]) as of 03/21/2019 09:40  Ref. Range 03/05/2019 10:58  WBC Latest Ref Range: 4.0 - 10.5 K/uL 10.5  RBC Latest Ref Range: 3.87 - 5.11 MIL/uL 3.32 (L)  Hemoglobin Latest Ref Range: 12.0 - 15.0 g/dL 10.4 (L)  HCT Latest Ref Range: 36.0 - 46.0 % 30.1 (L)  MCV Latest Ref Range: 80.0 - 100.0 fL 90.7  MCH Latest Ref Range: 26.0 - 34.0 pg 31.3  MCHC Latest Ref Range: 30.0 - 36.0 g/dL 34.6  RDW Latest Ref Range: 11.5 - 15.5 % 12.5  Platelets Latest Ref Range: 150 - 400 K/uL 239  nRBC Latest Ref Range: 0.0 - 0.2 % 0.0   Review of Systems  Constitution: Negative for decreased appetite, malaise/fatigue, weight gain and weight loss.  HENT: Negative for congestion.   Eyes: Negative for visual disturbance.  Cardiovascular: Positive for chest pain and dyspnea on exertion. Negative for leg swelling, palpitations and syncope.  Respiratory: Positive for shortness of breath. Negative for cough.   Endocrine: Negative for cold intolerance.  Hematologic/Lymphatic: Does not bruise/bleed easily.  Skin: Negative for itching and rash.  Musculoskeletal: Negative for myalgias.  Gastrointestinal: Negative for abdominal pain, nausea and vomiting.  Genitourinary: Negative for dysuria.  Neurological: Negative for dizziness and weakness.  Psychiatric/Behavioral: The patient is not nervous/anxious.   All other systems reviewed and are negative.        Vitals:   03/21/19 1038 03/21/19 1048  BP: (!) 156/75 (!) 178/84  Pulse: 81 80  SpO2: 99%      Body mass index is 30.11 kg/m. Filed Weights   03/21/19  1038  Weight: 170 lb (77.1 kg)     Objective:   Physical Exam  Constitutional: She is oriented to person, place, and time. She appears well-developed and well-nourished. No distress.  HENT:  Head: Normocephalic and atraumatic.  Eyes: Pupils are equal, round, and reactive to light. Conjunctivae are normal.  Neck: No JVD present.  Cardiovascular: Normal rate, regular rhythm and intact distal pulses.  Murmur heard. High-pitched blowing holosystolic murmur is present with a grade of 2/6 at the apex. Pulmonary/Chest: Effort normal and breath sounds normal. She has no wheezes. She has no rales.  Abdominal: Soft. Bowel sounds are normal. There is no rebound.  Musculoskeletal:        General: No edema.  Lymphadenopathy:    She has no cervical adenopathy.  Neurological: She is alert and oriented to person, place, and time. No cranial nerve deficit.  Skin: Skin is warm and dry.  Psychiatric: She has a normal mood and affect.  Nursing note and vitals reviewed.         Assessment & Recommendations:   68 year old female with hypertension, diabetes mellitus, referred for evaluation of shortness of breath.  Exertional dyspnea: Symptoms concerning for congestive heart failure.  She also had elevated BNP and CT chest findings suggestive of the same.  Will need echocardiogram to establish HFrEF versus HFpEF etiology.  Given her history of diabetes and symptoms of chest heaviness, ischemia needs to be excluded.  Recommend walking/Lexiscan nuclear stress test.  Until then, increase amlodipine to 10 mg daily for better blood pressure control.  Continue Lasix 20 mg daily, along with losartan-hydrochlorothiazide 20-25 mg daily.  Hyponatremia: According to patient's husband, she is chronically had sodium around 130.  Recent sodium was noted to be 125,  which could be dilutional hyponatremia in the setting of heart failure.  Will repeat labs before next visit.   Thank you for referring the patient to  [place]. Please feel free to contact with any questions.  Nigel Mormon, MD [facility] [place] Pager: [phone] Office: [phone] If no answer Cell [phone]

## 2019-03-21 NOTE — Patient Instructions (Signed)
Increase amlodipine to 2 tabs of 5 mg daily.

## 2019-03-24 ENCOUNTER — Other Ambulatory Visit: Payer: Self-pay

## 2019-03-24 ENCOUNTER — Ambulatory Visit (INDEPENDENT_AMBULATORY_CARE_PROVIDER_SITE_OTHER): Payer: Medicare HMO

## 2019-03-24 DIAGNOSIS — R0602 Shortness of breath: Secondary | ICD-10-CM | POA: Diagnosis not present

## 2019-03-25 DIAGNOSIS — D649 Anemia, unspecified: Secondary | ICD-10-CM | POA: Diagnosis not present

## 2019-03-25 DIAGNOSIS — Z13220 Encounter for screening for lipoid disorders: Secondary | ICD-10-CM | POA: Diagnosis not present

## 2019-03-25 DIAGNOSIS — R7989 Other specified abnormal findings of blood chemistry: Secondary | ICD-10-CM | POA: Diagnosis not present

## 2019-03-25 DIAGNOSIS — E871 Hypo-osmolality and hyponatremia: Secondary | ICD-10-CM | POA: Diagnosis not present

## 2019-03-26 ENCOUNTER — Other Ambulatory Visit: Payer: Self-pay | Admitting: Cardiology

## 2019-03-26 DIAGNOSIS — E871 Hypo-osmolality and hyponatremia: Secondary | ICD-10-CM

## 2019-03-26 LAB — LIPID PANEL
Chol/HDL Ratio: 2.5 ratio (ref 0.0–4.4)
Cholesterol, Total: 223 mg/dL — ABNORMAL HIGH (ref 100–199)
HDL: 89 mg/dL (ref >39)
LDL Chol Calc (NIH): 115 mg/dL — ABNORMAL HIGH (ref 0–99)
Triglycerides: 111 mg/dL (ref 0–149)
VLDL Cholesterol Cal: 19 mg/dL (ref 5–40)

## 2019-03-26 LAB — COMPREHENSIVE METABOLIC PANEL
ALT: 17 IU/L (ref 0–32)
AST: 18 IU/L (ref 0–40)
Albumin/Globulin Ratio: 1.6 (ref 1.2–2.2)
Albumin: 3.9 g/dL (ref 3.8–4.8)
Alkaline Phosphatase: 65 IU/L (ref 39–117)
BUN/Creatinine Ratio: 41 — ABNORMAL HIGH (ref 12–28)
BUN: 29 mg/dL — ABNORMAL HIGH (ref 8–27)
Bilirubin Total: 0.2 mg/dL (ref 0.0–1.2)
CO2: 22 mmol/L (ref 20–29)
Calcium: 9.2 mg/dL (ref 8.7–10.3)
Chloride: 91 mmol/L — ABNORMAL LOW (ref 96–106)
Creatinine, Ser: 0.7 mg/dL (ref 0.57–1.00)
GFR calc Af Amer: 104 mL/min/{1.73_m2} (ref >59)
GFR calc non Af Amer: 90 mL/min/{1.73_m2} (ref >59)
Globulin, Total: 2.4 g/dL (ref 1.5–4.5)
Glucose: 106 mg/dL — ABNORMAL HIGH (ref 65–99)
Potassium: 4.7 mmol/L (ref 3.5–5.2)
Sodium: 123 mmol/L — ABNORMAL LOW (ref 134–144)
Total Protein: 6.3 g/dL (ref 6.0–8.5)

## 2019-03-26 LAB — CBC
Hematocrit: 33.3 % — ABNORMAL LOW (ref 34.0–46.6)
Hemoglobin: 10.9 g/dL — ABNORMAL LOW (ref 11.1–15.9)
MCH: 29.9 pg (ref 26.6–33.0)
MCHC: 32.7 g/dL (ref 31.5–35.7)
MCV: 92 fL (ref 79–97)
Platelets: 287 10*3/uL (ref 150–450)
RBC: 3.64 x10E6/uL — ABNORMAL LOW (ref 3.77–5.28)
RDW: 12.6 % (ref 11.7–15.4)
WBC: 7.9 10*3/uL (ref 3.4–10.8)

## 2019-03-26 LAB — BRAIN NATRIURETIC PEPTIDE: BNP: 64.9 pg/mL (ref 0.0–100.0)

## 2019-03-26 NOTE — Progress Notes (Signed)
Discussed the results. Recommend urine osmolality to differentiate between depletional and dilutional hyponatremia.

## 2019-03-26 NOTE — Progress Notes (Signed)
Called pt to inform her about her stress test results

## 2019-03-27 ENCOUNTER — Other Ambulatory Visit: Payer: Medicare HMO

## 2019-03-27 ENCOUNTER — Other Ambulatory Visit: Payer: Self-pay | Admitting: Cardiology

## 2019-03-27 DIAGNOSIS — E871 Hypo-osmolality and hyponatremia: Secondary | ICD-10-CM | POA: Diagnosis not present

## 2019-03-28 LAB — OSMOLALITY, URINE: Osmolality, Ur: 202 mOsmol/kg

## 2019-04-01 ENCOUNTER — Ambulatory Visit (INDEPENDENT_AMBULATORY_CARE_PROVIDER_SITE_OTHER): Payer: Medicare HMO

## 2019-04-01 ENCOUNTER — Other Ambulatory Visit: Payer: Self-pay

## 2019-04-01 DIAGNOSIS — R0602 Shortness of breath: Secondary | ICD-10-CM | POA: Diagnosis not present

## 2019-04-01 NOTE — Progress Notes (Signed)
Patient referred by Gaynelle Arabian, MD for shortness of breath  Subjective:   Danielle Beck, female    DOB: 1951/05/12, 68 y.o.   MRN: [id number]  Chief Complaint  Patient presents with  . hyponatremia  . Follow-up  . Results    lab    HPI  67 year old female with hypertension, diabetes mellitus, shortness of breath, hyponatremia.  Stress test did not show any evidence of ischemia or infarction. Echocardiogram showed mild LVH, EF 123456, grade 1 diastolic dysfunction, mild aortic stenosis, mod mitral regurgitation, mild tricuspid regurgitation. Stress test did not show any ischemia or infarction.   Patient's symptoms of exertional dyspnea have improved.  Blood pressure is also improved.  Patient continues to have severe hyponatremia. Her urine osm is 202 mosm/L. In the setting of severe hyponatremia, this suggests inadequately suppressed osmolality, indicative of dilutional hyponatremia.    Initial consult note: Patient is here with her husband, who is a retired Engineer, drilling, and assists with translation. Patient is originally from [place], and [place] is not her first language. Patient was in [place], ED on 03/05/2019 with complaints of palpitations and shortness of breath.  CTA was negative for PE.  Did show mild vascular congestion and edema with small pleural effusions.  BNP was elevated concerning for congestive heart failure.  Patient was started on Lasix 10 mg daily, further increased to 20 mg daily by her PCP.  Her shortness of breath and leg edema has improved, but not completely resolved yet. She has also has exertional chest heaviness symptoms.    Past Medical History:  Diagnosis Date  . CHF (congestive heart failure) ([place])   . Diabetes mellitus without complication ([place])   . Hypertension      Past Surgical History:  Procedure Laterality Date  . TUBAL LIGATION  1985     Social History   Socioeconomic History  . Marital status: Married    Spouse name: Not  on file  . Number of children: Not on file  . Years of education: Not on file  . Highest education level: Not on file  Occupational History  . Not on file  Social Needs  . Financial resource strain: Not on file  . Food insecurity    Worry: Not on file    Inability: Not on file  . Transportation needs    Medical: Not on file    Non-medical: Not on file  Tobacco Use  . Smoking status: Never Smoker  . Smokeless tobacco: Never Used  Substance and Sexual Activity  . Alcohol use: Not Currently  . Drug use: Not on file  . Sexual activity: Not on file  Lifestyle  . Physical activity    Days per week: Not on file    Minutes per session: Not on file  . Stress: Not on file  Relationships  . Social Herbalist on phone: Not on file    Gets together: Not on file    Attends religious service: Not on file    Active member of club or organization: Not on file    Attends meetings of clubs or organizations: Not on file    Relationship status: Not on file  . Intimate partner violence    Fear of current or ex partner: Not on file    Emotionally abused: Not on file    Physically abused: Not on file    Forced sexual activity: Not on file  Other Topics Concern  . Not on file  Social History Narrative  . Not on file     Family History  Problem Relation Age of Onset  . Diabetes Mother   . Heart failure Father      Current Outpatient Medications on File Prior to Visit  Medication Sig Dispense Refill  . amLODipine (NORVASC) 5 MG tablet Take 10 mg by mouth daily.     . calcium-vitamin D (OSCAL WITH D) 250-125 MG-UNIT tablet Take 1 tablet by mouth daily.    . dorzolamide (TRUSOPT) 2 % ophthalmic solution Place 1 drop into the left eye 2 (two) times daily.    . ferrous sulfate 324 MG TBEC Take 324 mg by mouth.    . furosemide (LASIX) 20 MG tablet Take 1 tablet (20 mg total) by mouth daily. 3 tablet 0  . gabapentin (NEURONTIN) 400 MG capsule Take 400 mg by mouth 3 (three) times  daily.    Marland Kitchen LANTUS SOLOSTAR 100 UNIT/ML Solostar Pen Inject 30 Units into the skin daily.    Marland Kitchen latanoprost (XALATAN) 0.005 % ophthalmic solution Place 1 drop into both eyes at bedtime.     Marland Kitchen lisinopril-hydrochlorothiazide (ZESTORETIC) 20-25 MG tablet Take 1 tablet by mouth daily.    . metFORMIN (GLUCOPHAGE) 1000 MG tablet Take 1,000 mg by mouth 2 (two) times daily.    . potassium chloride (KLOR-CON) 10 MEQ tablet Take 1 tablet (10 mEq total) by mouth daily for 3 days. 3 tablet 0  . pyridOXINE (VITAMIN B-6) 100 MG tablet Take 100 mg by mouth daily.    Marland Kitchen thiamine (VITAMIN B-1) 100 MG tablet Take 100 mg by mouth daily.    . timolol (TIMOPTIC) 0.5 % ophthalmic solution Place 1 drop into the left eye 2 (two) times daily.    . vitamin B-12 (CYANOCOBALAMIN) 100 MCG tablet Take 100 mcg by mouth daily.    Marland Kitchen atorvastatin (LIPITOR) 10 MG tablet Take 10 mg by mouth daily.     No current facility-administered medications on file prior to visit.     Cardiovascular studies:  Echocardiogram 04/01/2019: Left ventricle cavity is normal in size. Mild concentric hypertrophy of the left ventricle. Normal LV systolic function with EF 60%. Normal global wall motion. Doppler evidence of grade I (impaired) diastolic dysfunction, normal LAP. Calculated EF 60%. Left atrial cavity is moderately dilated. Aneurysmal interatrial septum without 2D or color Doppler evidence of interatrial shunt. Trileaflet aortic valve. Mild calcification of the aortic valve annulus and leaflets.  Mild aortic stenosis. Aortic valve mean gradient of 10 mmHg, Vmax of 2.2  m/s. Calculated aortic valve area by continuity equation is 1.4 cm. Trace aortic regurgitation. Mild mitral valve leaflet calcification. Moderate (Grade II) mitral regurgitation. Mild tricuspid regurgitation.  No evidence of pulmonary hypertension.  Lexiscan Tetrofosmin Stress Test  03/24/2019: Resting EKG normal sinus rhythm, stress EKG nondiagnostic due to pharmacologic  stress test using Lexiscan.  Status symptoms included dyspnea and dizziness. There is a prominent diaphragmatic attenuation artifact in the inferior wall. without ischemia or scar. Left ventricular ejection fraction is  58% with normal wall motion. Low risk study. No previous exam available for comparison.  EKG 03/21/2019:  Sinus rhythm 79 bpm.  Normal EKG.   CTA chest 03/05/2019: No evidence of pulmonary embolism. Mild vascular congestion and edema with small pleural effusions. Aortic Atherosclerosis (ICD10-I70.0).  Echocardiogram 06/20/2017: - Left ventricle: The cavity size was normal. Wall thickness was   increased in a pattern of moderate LVH. Systolic function was   vigorous. The estimated ejection fraction was in  the range of 65%   to 70%. Wall motion was normal; there were no regional wall   motion abnormalities. Doppler parameters are consistent with   abnormal left ventricular relaxation (grade 1 diastolic   dysfunction). - Aortic valve: Mildly to moderately calcified annulus. Moderately   thickened, mildly calcified leaflets. - Mitral valve: Mildly calcified annulus. There was mild   regurgitation.   Recent labs: Results for Danielle Beck, Danielle Beck (MRN [id number]) as of 03/21/2019 09:40  Ref. Range 03/05/2019 10:58 03/05/2019 14:50 03/05/2019 18:00  Sodium Latest Ref Range: 135 - 145 mmol/L 125 (L)    Potassium Latest Ref Range: 3.5 - 5.1 mmol/L 4.8    Chloride Latest Ref Range: 98 - 111 mmol/L 97 (L)    CO2 Latest Ref Range: 22 - 32 mmol/L 19 (L)    Glucose Latest Ref Range: 70 - 99 mg/dL 206 (H)    BUN Latest Ref Range: 8 - 23 mg/dL 18    Creatinine Latest Ref Range: 0.44 - 1.00 mg/dL 1.05 (H)    Calcium Latest Ref Range: 8.9 - 10.3 mg/dL 8.9    Anion gap Latest Ref Range: 5 - 15  9    GFR, Est Non African American Latest Ref Range: >60 mL/min 55 (L)    GFR, Est African American Latest Ref Range: >60 mL/min >60    B Natriuretic Peptide Latest Ref Range: 0.0 - 100.0 pg/mL    393.5 (H)  Troponin I (High Sensitivity) Latest Ref Range: <18 ng/L 13 10    Results for Danielle Beck, Danielle Beck (MRN [id number]) as of 03/21/2019 09:40  Ref. Range 03/05/2019 10:58  WBC Latest Ref Range: 4.0 - 10.5 K/uL 10.5  RBC Latest Ref Range: 3.87 - 5.11 MIL/uL 3.32 (L)  Hemoglobin Latest Ref Range: 12.0 - 15.0 g/dL 10.4 (L)  HCT Latest Ref Range: 36.0 - 46.0 % 30.1 (L)  MCV Latest Ref Range: 80.0 - 100.0 fL 90.7  MCH Latest Ref Range: 26.0 - 34.0 pg 31.3  MCHC Latest Ref Range: 30.0 - 36.0 g/dL 34.6  RDW Latest Ref Range: 11.5 - 15.5 % 12.5  Platelets Latest Ref Range: 150 - 400 K/uL 239  nRBC Latest Ref Range: 0.0 - 0.2 % 0.0   Review of Systems  Constitution: Negative for decreased appetite, malaise/fatigue, weight gain and weight loss.  HENT: Negative for congestion.   Eyes: Negative for visual disturbance.  Cardiovascular: Positive for dyspnea on exertion. Negative for chest pain, leg swelling, palpitations and syncope.  Respiratory: Positive for shortness of breath. Negative for cough.   Endocrine: Negative for cold intolerance.  Hematologic/Lymphatic: Does not bruise/bleed easily.  Skin: Negative for itching and rash.  Musculoskeletal: Negative for myalgias.  Gastrointestinal: Negative for abdominal pain, nausea and vomiting.  Genitourinary: Negative for dysuria.  Neurological: Negative for dizziness and weakness.  Psychiatric/Behavioral: The patient is not nervous/anxious.   All other systems reviewed and are negative.        Vitals:   04/03/19 1258  BP: 140/71  Pulse: 88  Temp: 98 F (36.7 C)  SpO2: 97%     Body mass index is 30.65 kg/m. Filed Weights   04/03/19 1258  Weight: 173 lb (78.5 kg)     Objective:   Physical Exam  Constitutional: She is oriented to person, place, and time. She appears well-developed and well-nourished. No distress.  HENT:  Head: Normocephalic and atraumatic.  Eyes: Pupils are equal, round, and reactive to light.  Conjunctivae are normal.  Neck: No JVD present.  Cardiovascular: Normal rate, regular rhythm and intact distal pulses.  Murmur heard. High-pitched blowing holosystolic murmur is present with a grade of 2/6 at the apex. Pulmonary/Chest: Effort normal and breath sounds normal. She has no wheezes. She has no rales.  Abdominal: Soft. Bowel sounds are normal. There is no rebound.  Musculoskeletal:        General: Edema (1+ bilateral) present.  Lymphadenopathy:    She has no cervical adenopathy.  Neurological: She is alert and oriented to person, place, and time. No cranial nerve deficit.  Skin: Skin is warm and dry.  Psychiatric: She has a normal mood and affect.  Nursing note and vitals reviewed.         Assessment & Recommendations:   68 year old female with hypertension, diabetes mellitus, HFpEF, hyponatremia-likely dilutional.  Exertional dyspnea: Likely due to HFpEF-given mild diastolic dysfunction, mod MR.  Symptoms concerning for congestive heart failure.  She also had elevated BNP and CT chest findings suggestive of the same.  Will need echocardiogram to establish HFrEF versus HFpEF etiology.  Given her history of diabetes and symptoms of chest heaviness, ischemia needs to be excluded.  Recommend walking/Lexiscan nuclear stress test.  Until then, increase amlodipine to 10 mg daily for better blood pressure control.  Continue Lasix 20 mg daily, along with losartan-hydrochlorothiazide 20-25 mg daily.  Hyponatremia:  Remains severe at 123, with urine osmolalities under suppressed at 202. In the setting of severe hyponatremia, this suggests of dilutional hyponatremia.   Recommend stopping thiazide diuretic-HCTZ, and instead use loop diuretics. I will switch lisinopril-HCTZ to losartan 50 mg alone, and increase lasix to 40 mg daily.  She may take additional dose of 20 mg daily, as needed, for leg edema.  Patient will have follow-up visit with her PCP next week, but she is likely to  get labs checked.  I will see her back in 4 weeks.  If hyponatremia persists in spite of euvolemic status, consider nephrology referral.   Nigel Mormon, MD [facility] Pager: [phone] Office: [phone] If no answer Cell [phone]

## 2019-04-03 ENCOUNTER — Ambulatory Visit (INDEPENDENT_AMBULATORY_CARE_PROVIDER_SITE_OTHER): Payer: Medicare HMO | Admitting: Cardiology

## 2019-04-03 ENCOUNTER — Other Ambulatory Visit: Payer: Self-pay

## 2019-04-03 ENCOUNTER — Encounter: Payer: Self-pay | Admitting: Cardiology

## 2019-04-03 VITALS — BP 140/71 | HR 88 | Temp 98.0°F | Ht 63.0 in | Wt 173.0 lb

## 2019-04-03 DIAGNOSIS — I10 Essential (primary) hypertension: Secondary | ICD-10-CM | POA: Diagnosis not present

## 2019-04-03 DIAGNOSIS — R7989 Other specified abnormal findings of blood chemistry: Secondary | ICD-10-CM

## 2019-04-03 DIAGNOSIS — R0602 Shortness of breath: Secondary | ICD-10-CM

## 2019-04-03 DIAGNOSIS — I5032 Chronic diastolic (congestive) heart failure: Secondary | ICD-10-CM

## 2019-04-03 DIAGNOSIS — E871 Hypo-osmolality and hyponatremia: Secondary | ICD-10-CM

## 2019-04-03 MED ORDER — FUROSEMIDE 20 MG PO TABS: 40.0000 mg | ORAL_TABLET | Freq: Every day | ORAL | 0 refills | Status: DC

## 2019-04-03 MED ORDER — LOSARTAN POTASSIUM 50 MG PO TABS: 50.0000 mg | ORAL_TABLET | Freq: Every day | ORAL | 1 refills | Status: DC

## 2019-04-03 MED ORDER — LOSARTAN POTASSIUM 50 MG PO TABS: 50.0000 mg | ORAL_TABLET | Freq: Every day | ORAL | 3 refills | Status: DC

## 2019-04-03 NOTE — Patient Instructions (Signed)
Heart Failure Eating Plan Heart failure, also called congestive heart failure, occurs when your heart does not pump blood well enough to meet your body's needs for oxygen-rich blood. Heart failure is a long-term (chronic) condition. Living with heart failure can be challenging. However, following your health care provider's instructions about a healthy lifestyle and working with a diet and nutrition specialist (dietitian) to choose the right foods may help to improve your symptoms. What are tips for following this plan? Reading food labels  Check food labels for the amount of sodium per serving. Choose foods that have less than 140 mg (milligrams) of sodium in each serving.  Check food labels for the number of calories per serving. This is important if you need to limit your daily calorie intake to lose weight.  Check food labels for the serving size. If you eat more than one serving, you will be eating more sodium and calories than what is listed on the label.  Look for foods that are labeled as "sodium-free," "very low sodium," or "low sodium." ? Foods labeled as "reduced sodium" or "lightly salted" may still have more sodium than what is recommended for you. Cooking  Avoid adding salt when cooking. Ask your health care provider or dietitian before using salt substitutes.  Season food with salt-free seasonings, spices, or herbs. Check the label of seasoning mixes to make sure they do not contain salt.  Cook with heart-healthy oils, such as olive, canola, soybean, or sunflower oil.  Do not fry foods. Cook foods using low-fat methods, such as baking, boiling, grilling, and broiling.  Limit unhealthy fats when cooking by: ? Removing the skin from poultry, such as chicken. ? Removing all visible fats from meats. ? Skimming the fat off from stews, soups, and gravies before serving them. Meal planning   Limit your intake of: ? Processed, canned, or pre-packaged foods. ? Foods that are  high in trans fat, such as fried foods. ? Sweets, desserts, sugary drinks, and other foods with added sugar. ? Full-fat dairy products, such as whole milk.  Eat a balanced diet that includes: ? 4-5 servings of fruit each day and 4-5 servings of vegetables each day. At each meal, try to fill half of your plate with fruits and vegetables. ? Up to 6-8 servings of whole grains each day. ? Up to 2 servings of lean meat, poultry, or fish each day. One serving of meat is equal to 3 oz. This is about the same size as a deck of cards. ? 2 servings of low-fat dairy each day. ? Heart-healthy fats. Healthy fats called omega-3 fatty acids are found in foods such as flaxseed and cold-water fish like sardines, salmon, and mackerel.  Aim to eat 25-35 g (grams) of fiber a day. Foods that are high in fiber include apples, broccoli, carrots, beans, peas, and whole grains.  Do not add salt or condiments that contain salt (such as soy sauce) to foods before eating.  When eating at a restaurant, ask that your food be prepared with less salt or no salt, if possible.  Try to eat 2 or more vegetarian meals each week.  Eat more home-cooked food and eat less restaurant, buffet, and fast food. General information  Do not eat more than 2,300 mg of salt (sodium) a day. The amount of sodium that is recommended for you may be lower, depending on your condition.  Maintain a healthy body weight as directed. Ask your health care provider what a healthy weight is   for you. ? Check your weight every day. ? Work with your health care provider and dietitian to make a plan that is right for you to lose weight or maintain your current weight.  Limit how much fluid you drink. Ask your health care provider or dietitian how much fluid you can have each day.  Limit or avoid alcohol as told by your health care provider or dietitian. Recommended foods The items listed may not be a complete list. Talk with your dietitian about what  dietary choices are best for you. Fruits All fresh, frozen, and canned fruits. Dried fruits, such as raisins, prunes, and cranberries. Vegetables All fresh vegetables. Vegetables that are frozen without sauce or added salt. Low-sodium or sodium-free canned vegetables. Grains Bread with less than 80 mg of sodium per slice. Whole-wheat pasta, quinoa, and brown rice. Oats and oatmeal. Barley. Millet. Grits and cream of wheat. Whole-grain and whole-wheat cold cereal. Meats and other protein foods Lean cuts of meat. Skinless chicken and turkey. Fish with high omega-3 fatty acids, such as salmon, sardines, and other cold-water fishes. Eggs. Dried beans, peas, and edamame. Unsalted nuts and nut butters. Dairy Low-fat or nonfat (skim) milk and dried milk. Rice milk, soy milk, and almond milk. Low-fat or nonfat yogurt. Small amounts of reduced-sodium block cheese. Low-sodium cottage cheese. Fats and oils Olive, canola, soybean, flaxseed, or sunflower oil. Avocado. Sweets and desserts Apple sauce. Granola bars. Sugar-free pudding and gelatin. Frozen fruit bars. Seasoning and other foods Fresh and dried herbs. Lemon or lime juice. Vinegar. Low-sodium ketchup. Salt-free marinades, salad dressings, sauces, and seasonings. The items listed above may not be a complete list of foods and beverages you can eat. Contact a dietitian for more information. Foods to avoid The items listed may not be a complete list. Talk with your dietitian about what dietary choices are best for you. Fruits Fruits that are dried with sodium-containing preservatives. Vegetables Canned vegetables. Frozen vegetables with sauce or seasonings. Creamed vegetables. French fries. Onion rings. Pickled vegetables and sauerkraut. Grains Bread with more than 80 mg of sodium per slice. Hot or cold cereal with more than 140 mg sodium per serving. Salted pretzels and crackers. Pre-packaged breadcrumbs. Bagels, croissants, and biscuits. Meats  and other protein foods Ribs and chicken wings. Bacon, ham, pepperoni, bologna, salami, and packaged luncheon meats. Hot dogs, bratwurst, and sausage. Canned meat. Smoked meat and fish. Salted nuts and seeds. Dairy Whole milk, half-and-half, and cream. Buttermilk. Processed cheese, cheese spreads, and cheese curds. Regular cottage cheese. Feta cheese. Shredded cheese. String cheese. Fats and oils Butter, lard, shortening, ghee, and bacon fat. Canned and packaged gravies. Seasoning and other foods Onion salt, garlic salt, table salt, and sea salt. Marinades. Regular salad dressings. Relishes, pickles, and olives. Meat flavorings and tenderizers, and bouillon cubes. Horseradish, ketchup, and mustard. Worcestershire sauce. Teriyaki sauce, soy sauce (including reduced sodium). Hot sauce and Tabasco sauce. Steak sauce, fish sauce, oyster sauce, and cocktail sauce. Taco seasonings. Barbecue sauce. Tartar sauce. The items listed above may not be a complete list of foods and beverages you should avoid. Contact a dietitian for more information. Summary  A heart failure eating plan includes changes that limit your intake of sodium and unhealthy fat, and it may help you lose weight or maintain a healthy weight. Your health care provider may also recommend limiting how much fluid you drink.  Most people with heart failure should eat no more than 2,300 mg of salt (sodium) a day. The amount of sodium   that is recommended for you may be lower, depending on your condition.  Contact your health care provider or dietitian before making any major changes to your diet. This information is not intended to replace advice given to you by your health care provider. Make sure you discuss any questions you have with your health care provider. Document Released: 09/29/2016 Document Revised: 07/11/2018 Document Reviewed: 09/29/2016 Elsevier Patient Education  [place].

## 2019-04-04 NOTE — Telephone Encounter (Signed)
Please read

## 2019-04-04 NOTE — Telephone Encounter (Signed)
Not sure why they are not able to say. They should have gotten a printed copy of the echocardiogram at the checkout though.

## 2019-04-04 NOTE — Telephone Encounter (Signed)
c 

## 2019-04-07 ENCOUNTER — Other Ambulatory Visit: Payer: Self-pay

## 2019-04-07 DIAGNOSIS — I5032 Chronic diastolic (congestive) heart failure: Secondary | ICD-10-CM

## 2019-04-07 MED ORDER — FUROSEMIDE 20 MG PO TABS: 40.0000 mg | ORAL_TABLET | Freq: Every day | ORAL | 1 refills | Status: DC

## 2019-04-09 DIAGNOSIS — E871 Hypo-osmolality and hyponatremia: Secondary | ICD-10-CM | POA: Diagnosis not present

## 2019-04-09 DIAGNOSIS — E1139 Type 2 diabetes mellitus with other diabetic ophthalmic complication: Secondary | ICD-10-CM | POA: Diagnosis not present

## 2019-04-09 DIAGNOSIS — E11311 Type 2 diabetes mellitus with unspecified diabetic retinopathy with macular edema: Secondary | ICD-10-CM | POA: Diagnosis not present

## 2019-04-09 DIAGNOSIS — I5032 Chronic diastolic (congestive) heart failure: Secondary | ICD-10-CM | POA: Diagnosis not present

## 2019-04-09 DIAGNOSIS — E1142 Type 2 diabetes mellitus with diabetic polyneuropathy: Secondary | ICD-10-CM | POA: Diagnosis not present

## 2019-04-09 DIAGNOSIS — I10 Essential (primary) hypertension: Secondary | ICD-10-CM | POA: Diagnosis not present

## 2019-04-09 DIAGNOSIS — R21 Rash and other nonspecific skin eruption: Secondary | ICD-10-CM | POA: Diagnosis not present

## 2019-04-09 DIAGNOSIS — E11319 Type 2 diabetes mellitus with unspecified diabetic retinopathy without macular edema: Secondary | ICD-10-CM | POA: Diagnosis not present

## 2019-04-09 DIAGNOSIS — E7800 Pure hypercholesterolemia, unspecified: Secondary | ICD-10-CM | POA: Diagnosis not present

## 2019-04-09 DIAGNOSIS — I700 Atherosclerosis of aorta: Secondary | ICD-10-CM | POA: Diagnosis not present

## 2019-04-09 DIAGNOSIS — Z794 Long term (current) use of insulin: Secondary | ICD-10-CM | POA: Diagnosis not present

## 2019-04-09 DIAGNOSIS — I110 Hypertensive heart disease with heart failure: Secondary | ICD-10-CM | POA: Diagnosis not present

## 2019-04-09 DIAGNOSIS — E1149 Type 2 diabetes mellitus with other diabetic neurological complication: Secondary | ICD-10-CM | POA: Diagnosis not present

## 2019-04-11 DIAGNOSIS — E113511 Type 2 diabetes mellitus with proliferative diabetic retinopathy with macular edema, right eye: Secondary | ICD-10-CM | POA: Diagnosis not present

## 2019-04-22 ENCOUNTER — Ambulatory Visit
Admission: RE | Admit: 2019-04-22 | Discharge: 2019-04-22 | Disposition: A | Discharge status: Home / Self Care | Payer: Medicare HMO | Source: Ambulatory Visit | Attending: Family Medicine | Admitting: Family Medicine

## 2019-04-22 ENCOUNTER — Other Ambulatory Visit: Payer: Self-pay

## 2019-04-22 DIAGNOSIS — M8589 Other specified disorders of bone density and structure, multiple sites: Secondary | ICD-10-CM | POA: Diagnosis not present

## 2019-04-22 DIAGNOSIS — Z780 Asymptomatic menopausal state: Secondary | ICD-10-CM | POA: Diagnosis not present

## 2019-04-22 DIAGNOSIS — Z1231 Encounter for screening mammogram for malignant neoplasm of breast: Secondary | ICD-10-CM

## 2019-04-22 DIAGNOSIS — E2839 Other primary ovarian failure: Secondary | ICD-10-CM

## 2019-04-28 DIAGNOSIS — E113512 Type 2 diabetes mellitus with proliferative diabetic retinopathy with macular edema, left eye: Secondary | ICD-10-CM | POA: Diagnosis not present

## 2019-04-30 DIAGNOSIS — I10 Essential (primary) hypertension: Secondary | ICD-10-CM | POA: Diagnosis not present

## 2019-04-30 DIAGNOSIS — E7800 Pure hypercholesterolemia, unspecified: Secondary | ICD-10-CM | POA: Diagnosis not present

## 2019-04-30 DIAGNOSIS — E1139 Type 2 diabetes mellitus with other diabetic ophthalmic complication: Secondary | ICD-10-CM | POA: Diagnosis not present

## 2019-05-07 ENCOUNTER — Ambulatory Visit (INDEPENDENT_AMBULATORY_CARE_PROVIDER_SITE_OTHER): Payer: Medicare HMO | Admitting: Cardiology

## 2019-05-07 ENCOUNTER — Other Ambulatory Visit: Payer: Self-pay

## 2019-05-07 ENCOUNTER — Encounter: Payer: Self-pay | Admitting: Cardiology

## 2019-05-07 VITALS — BP 127/68 | HR 84 | Ht 63.0 in | Wt 172.8 lb

## 2019-05-07 DIAGNOSIS — I5032 Chronic diastolic (congestive) heart failure: Secondary | ICD-10-CM

## 2019-05-07 DIAGNOSIS — I10 Essential (primary) hypertension: Secondary | ICD-10-CM | POA: Diagnosis not present

## 2019-05-07 MED ORDER — TORSEMIDE 20 MG PO TABS: 20.0000 mg | ORAL_TABLET | Freq: Two times a day (BID) | ORAL | 3 refills | Status: DC

## 2019-05-07 NOTE — Progress Notes (Signed)
Patient referred by Gaynelle Arabian, MD for shortness of breath  Subjective:   Danielle Beck, female    DOB: 1951-05-03, 68 y.o.   MRN: [id number]  Chief Complaint  Patient presents with  . heart failure  . Follow-up    HPI  68 year old female with hypertension, diabetes mellitus, HFpEF, dilutional hyponatremia-now resolved.  Patient has had significant improvement in exertional dyspnea. However, she continues to have leg swelling. Na has increased from 123 to 139. She admits to restricting free water intake.   Past Medical History:  Diagnosis Date  . CHF (congestive heart failure) ([place])   . Diabetes mellitus without complication ([place])   . Hyperlipidemia   . Hypertension      Past Surgical History:  Procedure Laterality Date  . TUBAL LIGATION  1985     Social History   Socioeconomic History  . Marital status: Married    Spouse name: Not on file  . Number of children: 3  . Years of education: Not on file  . Highest education level: Not on file  Occupational History  . Not on file  Social Needs  . Financial resource strain: Not on file  . Food insecurity    Worry: Not on file    Inability: Not on file  . Transportation needs    Medical: Not on file    Non-medical: Not on file  Tobacco Use  . Smoking status: Never Smoker  . Smokeless tobacco: Never Used  Substance and Sexual Activity  . Alcohol use: Not Currently  . Drug use: Not on file  . Sexual activity: Not on file  Lifestyle  . Physical activity    Days per week: Not on file    Minutes per session: Not on file  . Stress: Not on file  Relationships  . Social Herbalist on phone: Not on file    Gets together: Not on file    Attends religious service: Not on file    Active member of club or organization: Not on file    Attends meetings of clubs or organizations: Not on file    Relationship status: Not on file  . Intimate partner violence    Fear of current or ex partner: Not  on file    Emotionally abused: Not on file    Physically abused: Not on file    Forced sexual activity: Not on file  Other Topics Concern  . Not on file  Social History Narrative  . Not on file     Family History  Problem Relation Age of Onset  . Diabetes Mother   . Heart failure Father   . Diabetes Brother   . Diabetes Brother   . Diabetes Brother   . Hypertension Brother      Current Outpatient Medications on File Prior to Visit  Medication Sig Dispense Refill  . amLODipine (NORVASC) 5 MG tablet Take 10 mg by mouth daily.     Marland Kitchen atorvastatin (LIPITOR) 10 MG tablet Take 10 mg by mouth daily.    . calcium-vitamin D (OSCAL WITH D) 250-125 MG-UNIT tablet Take 1 tablet by mouth 2 (two) times daily.     . dorzolamide (TRUSOPT) 2 % ophthalmic solution Place 1 drop into the left eye 2 (two) times daily.    . folic acid (FOLVITE) 010 MCG tablet Take 400 mcg by mouth daily.    . furosemide (LASIX) 20 MG tablet Take 2 tablets (40 mg total) by mouth daily.  You may take additional 20 mg early afternoon, in case of more than usual leg swelling 90 tablet 1  . gabapentin (NEURONTIN) 400 MG capsule Take 400 mg by mouth 3 (three) times daily.    Marland Kitchen LANTUS SOLOSTAR 100 UNIT/ML Solostar Pen Inject 30 Units into the skin daily.    Marland Kitchen latanoprost (XALATAN) 0.005 % ophthalmic solution Place 1 drop into both eyes at bedtime.     Marland Kitchen losartan (COZAAR) 50 MG tablet Take 1 tablet (50 mg total) by mouth daily. 90 tablet 1  . metFORMIN (GLUCOPHAGE) 1000 MG tablet Take 1,000 mg by mouth 2 (two) times daily.    . Potassium 99 MG TABS Take 2 tablets by mouth daily.    Marland Kitchen pyridOXINE (VITAMIN B-6) 100 MG tablet Take 100 mg by mouth daily.    Marland Kitchen thiamine (VITAMIN B-1) 100 MG tablet Take 100 mg by mouth daily.    . timolol (TIMOPTIC) 0.5 % ophthalmic solution Place 1 drop into the left eye 2 (two) times daily.    . vitamin B-12 (CYANOCOBALAMIN) 100 MCG tablet Take 100 mcg by mouth daily.    . ferrous sulfate 324 MG  TBEC Take 324 mg by mouth.    . potassium chloride (KLOR-CON) 10 MEQ tablet Take 1 tablet (10 mEq total) by mouth daily for 3 days. 3 tablet 0   No current facility-administered medications on file prior to visit.     Cardiovascular studies:  Echocardiogram 04/01/2019: Left ventricle cavity is normal in size. Mild concentric hypertrophy of the left ventricle. Normal LV systolic function with EF 60%. Normal global wall motion. Doppler evidence of grade I (impaired) diastolic dysfunction, normal LAP. Calculated EF 60%. Left atrial cavity is moderately dilated. Aneurysmal interatrial septum without 2D or color Doppler evidence of interatrial shunt. Trileaflet aortic valve. Mild calcification of the aortic valve annulus and leaflets.  Mild aortic stenosis. Aortic valve mean gradient of 10 mmHg, Vmax of 2.2  m/s. Calculated aortic valve area by continuity equation is 1.4 cm. Trace aortic regurgitation. Mild mitral valve leaflet calcification. Moderate (Grade II) mitral regurgitation. Mild tricuspid regurgitation.  No evidence of pulmonary hypertension.  Lexiscan Tetrofosmin Stress Test  03/24/2019: Resting EKG normal sinus rhythm, stress EKG nondiagnostic due to pharmacologic stress test using Lexiscan.  Status symptoms included dyspnea and dizziness. There is a prominent diaphragmatic attenuation artifact in the inferior wall. without ischemia or scar. Left ventricular ejection fraction is  58% with normal wall motion. Low risk study. No previous exam available for comparison.  EKG 03/21/2019:  Sinus rhythm 79 bpm.  Normal EKG.   CTA chest 03/05/2019: No evidence of pulmonary embolism. Mild vascular congestion and edema with small pleural effusions. Aortic Atherosclerosis (ICD10-I70.0).  Echocardiogram 06/20/2017: - Left ventricle: The cavity size was normal. Wall thickness was   increased in a pattern of moderate LVH. Systolic function was   vigorous. The estimated ejection fraction was  in the range of 65%   to 70%. Wall motion was normal; there were no regional wall   motion abnormalities. Doppler parameters are consistent with   abnormal left ventricular relaxation (grade 1 diastolic   dysfunction). - Aortic valve: Mildly to moderately calcified annulus. Moderately   thickened, mildly calcified leaflets. - Mitral valve: Mildly calcified annulus. There was mild   regurgitation.   Recent labs: 05/07/2019: Glucose 93, BUN/Cr 39/0.9. EGFR 62. Na/K 139/4.6. Rest of the CMP normal Chol 221, TG 111, HDL 72, LDL 130   Review of Systems  Constitution: Negative for  decreased appetite, malaise/fatigue, weight gain and weight loss.  HENT: Negative for congestion.   Eyes: Negative for visual disturbance.  Cardiovascular: Positive for dyspnea on exertion. Negative for chest pain, leg swelling, palpitations and syncope.  Respiratory: Positive for shortness of breath. Negative for cough.   Endocrine: Negative for cold intolerance.  Hematologic/Lymphatic: Does not bruise/bleed easily.  Skin: Negative for itching and rash.  Musculoskeletal: Negative for myalgias.  Gastrointestinal: Negative for abdominal pain, nausea and vomiting.  Genitourinary: Negative for dysuria.  Neurological: Negative for dizziness and weakness.  Psychiatric/Behavioral: The patient is not nervous/anxious.   All other systems reviewed and are negative.        Vitals:   05/07/19 1050  BP: 127/68  Pulse: 84  SpO2: 98%     Body mass index is 30.61 kg/m. Filed Weights   05/07/19 1050  Weight: 172 lb 12.8 oz (78.4 kg)     Objective:   Physical Exam  Constitutional: She is oriented to person, place, and time. She appears well-developed and well-nourished. No distress.  HENT:  Head: Normocephalic and atraumatic.  Eyes: Pupils are equal, round, and reactive to light. Conjunctivae are normal.  Neck: No JVD present.  Cardiovascular: Normal rate, regular rhythm and intact distal pulses.   Murmur heard. High-pitched blowing holosystolic murmur is present with a grade of 2/6 at the apex. Pulmonary/Chest: Effort normal and breath sounds normal. She has no wheezes. She has no rales.  Abdominal: Soft. Bowel sounds are normal. There is no rebound.  Musculoskeletal:        General: Edema (1+ bilateral) present.  Lymphadenopathy:    She has no cervical adenopathy.  Neurological: She is alert and oriented to person, place, and time. No cranial nerve deficit.  Skin: Skin is warm and dry.  Psychiatric: She has a normal mood and affect.  Nursing note and vitals reviewed.         Assessment & Recommendations:   68 year old female with hypertension, diabetes mellitus, HFpEF, dilutional hyponatremia-now resolved.  HFpEF: Exertional dyspnea improved, but leg edema persists. Switch lasix 40-20 to torsemide 20 mg bid. Restrict salt intake but encourage increased free water intake, especially in light of increased BUN. Encouraged use of compression stockings. Keep legs elevated at night.  Repeat BMP in 3-4 weeks  Hypertension: Much improved.  Hyponatremia:  Likely dilutional, exacerbated by HCTZ which is now stopped. Na increased from 123 to 139 over 4 weeks  Fu in 4-6 weeks.   Nigel Mormon, MD [facility] Pager: [phone] Office: [phone] If no answer Cell [phone]

## 2019-05-13 DIAGNOSIS — E113511 Type 2 diabetes mellitus with proliferative diabetic retinopathy with macular edema, right eye: Secondary | ICD-10-CM | POA: Diagnosis not present

## 2019-06-04 ENCOUNTER — Other Ambulatory Visit (HOSPITAL_COMMUNITY): Payer: Self-pay | Admitting: Cardiology

## 2019-06-04 DIAGNOSIS — I5032 Chronic diastolic (congestive) heart failure: Secondary | ICD-10-CM | POA: Diagnosis not present

## 2019-06-05 ENCOUNTER — Other Ambulatory Visit: Payer: Self-pay | Admitting: Cardiology

## 2019-06-05 DIAGNOSIS — N179 Acute kidney failure, unspecified: Secondary | ICD-10-CM

## 2019-06-05 LAB — BASIC METABOLIC PANEL
BUN/Creatinine Ratio: 35 — ABNORMAL HIGH (ref 12–28)
BUN: 44 mg/dL — ABNORMAL HIGH (ref 8–27)
CO2: 25 mmol/L (ref 20–29)
Calcium: 9.3 mg/dL (ref 8.7–10.3)
Chloride: 98 mmol/L (ref 96–106)
Creatinine, Ser: 1.24 mg/dL — ABNORMAL HIGH (ref 0.57–1.00)
GFR calc Af Amer: 52 mL/min/{1.73_m2} — ABNORMAL LOW (ref >59)
GFR calc non Af Amer: 45 mL/min/{1.73_m2} — ABNORMAL LOW (ref >59)
Glucose: 134 mg/dL — ABNORMAL HIGH (ref 65–99)
Potassium: 4.5 mmol/L (ref 3.5–5.2)
Sodium: 135 mmol/L (ref 134–144)

## 2019-06-05 NOTE — Progress Notes (Signed)
Spoke with patient husband, he requested a printed order. Printed and ready to pick up.

## 2019-06-12 ENCOUNTER — Other Ambulatory Visit (HOSPITAL_COMMUNITY): Payer: Self-pay | Admitting: Cardiology

## 2019-06-12 DIAGNOSIS — N179 Acute kidney failure, unspecified: Secondary | ICD-10-CM | POA: Diagnosis not present

## 2019-06-12 NOTE — Telephone Encounter (Signed)
Please read

## 2019-06-13 LAB — BASIC METABOLIC PANEL
BUN/Creatinine Ratio: 26 (ref 12–28)
BUN: 22 mg/dL (ref 8–27)
CO2: 21 mmol/L (ref 20–29)
Calcium: 9.5 mg/dL (ref 8.7–10.3)
Chloride: 107 mmol/L — ABNORMAL HIGH (ref 96–106)
Creatinine, Ser: 0.86 mg/dL (ref 0.57–1.00)
GFR calc Af Amer: 81 mL/min/{1.73_m2} (ref >59)
GFR calc non Af Amer: 70 mL/min/{1.73_m2} (ref >59)
Glucose: 152 mg/dL — ABNORMAL HIGH (ref 65–99)
Potassium: 5.3 mmol/L — ABNORMAL HIGH (ref 3.5–5.2)
Sodium: 137 mmol/L (ref 134–144)

## 2019-06-18 DIAGNOSIS — E113511 Type 2 diabetes mellitus with proliferative diabetic retinopathy with macular edema, right eye: Secondary | ICD-10-CM | POA: Diagnosis not present

## 2019-06-19 ENCOUNTER — Ambulatory Visit: Payer: Medicare HMO | Admitting: Cardiology

## 2019-06-20 NOTE — Telephone Encounter (Signed)
Please read

## 2019-06-26 ENCOUNTER — Ambulatory Visit (INDEPENDENT_AMBULATORY_CARE_PROVIDER_SITE_OTHER): Payer: Medicare HMO | Admitting: Cardiology

## 2019-06-26 ENCOUNTER — Other Ambulatory Visit: Payer: Self-pay

## 2019-06-26 ENCOUNTER — Encounter: Payer: Self-pay | Admitting: Cardiology

## 2019-06-26 VITALS — BP 148/66 | HR 84 | Temp 97.3°F | Ht 63.0 in | Wt 180.2 lb

## 2019-06-26 DIAGNOSIS — N179 Acute kidney failure, unspecified: Secondary | ICD-10-CM | POA: Diagnosis not present

## 2019-06-26 DIAGNOSIS — I10 Essential (primary) hypertension: Secondary | ICD-10-CM

## 2019-06-26 DIAGNOSIS — I5032 Chronic diastolic (congestive) heart failure: Secondary | ICD-10-CM

## 2019-06-26 DIAGNOSIS — E871 Hypo-osmolality and hyponatremia: Secondary | ICD-10-CM

## 2019-06-26 MED ORDER — TORSEMIDE 20 MG PO TABS: 20.0000 mg | ORAL_TABLET | ORAL | 3 refills | Status: DC

## 2019-06-26 NOTE — Progress Notes (Signed)
Patient referred by Gaynelle Arabian, MD for shortness of breath  Subjective:   Danielle Beck, female    DOB: 11-Feb-1951, 69 y.o.   MRN: [id number]  Chief Complaint  Patient presents with  . Congestive Heart Failure  . Hypertension  . Follow-up    53 week     69 year old female with hypertension, diabetes mellitus, HFpEF, dilutional hyponatremia-now resolved.  Since her last visit, patient had acute kidney injury, with creatinine increased from 0.7-1.2.  I reduced her torsemide from 20 mg twice daily to none.  She had recurrence of leg swelling, along with improvement in creatinine.  At this resume torsemide at 20 mg once every other day.  Given her elevated blood pressure, I increased her losartan to 1.5 tablet of 50 mg daily.  Leg swelling Is improved, but still persists.  Blood pressure remains elevated.  She has mild exertional dyspnea.   Current Outpatient Medications on File Prior to Visit  Medication Sig Dispense Refill  . amLODipine (NORVASC) 5 MG tablet Take 10 mg by mouth daily.     Marland Kitchen atorvastatin (LIPITOR) 10 MG tablet Take 10 mg by mouth daily.    . calcium-vitamin D (OSCAL WITH D) 250-125 MG-UNIT tablet Take 1 tablet by mouth 2 (two) times daily.     . dorzolamide (TRUSOPT) 2 % ophthalmic solution Place 1 drop into the left eye 2 (two) times daily.    . ferrous sulfate 324 MG TBEC Take 324 mg by mouth.    . folic acid (FOLVITE) 474 MCG tablet Take 400 mcg by mouth daily.    Marland Kitchen gabapentin (NEURONTIN) 400 MG capsule Take 400 mg by mouth 3 (three) times daily.    Marland Kitchen LANTUS SOLOSTAR 100 UNIT/ML Solostar Pen Inject 30 Units into the skin daily.    Marland Kitchen latanoprost (XALATAN) 0.005 % ophthalmic solution Place 1 drop into both eyes at bedtime.     Marland Kitchen losartan (COZAAR) 50 MG tablet Take 1 tablet (50 mg total) by mouth daily. (Patient taking differently: Take 75 mg by mouth daily. ) 90 tablet 1  . metFORMIN (GLUCOPHAGE) 1000 MG tablet Take 1,000 mg by mouth 2 (two) times daily.      . Potassium 99 MG TABS Take 2 tablets by mouth daily.    Marland Kitchen pyridOXINE (VITAMIN B-6) 100 MG tablet Take 100 mg by mouth daily.    Marland Kitchen thiamine (VITAMIN B-1) 100 MG tablet Take 100 mg by mouth daily.    . timolol (TIMOPTIC) 0.5 % ophthalmic solution Place 1 drop into the left eye 2 (two) times daily.    Marland Kitchen torsemide (DEMADEX) 20 MG tablet Take 1 tablet (20 mg total) by mouth 2 (two) times daily. (Patient taking differently: Take 20 mg by mouth every other day. ) 60 tablet 3  . vitamin B-12 (CYANOCOBALAMIN) 100 MCG tablet Take 100 mcg by mouth daily.    . potassium chloride (KLOR-CON) 10 MEQ tablet Take 1 tablet (10 mEq total) by mouth daily for 3 days. 3 tablet 0   No current facility-administered medications on file prior to visit.    Cardiovascular studies:  Echocardiogram 04/01/2019: Left ventricle cavity is normal in size. Mild concentric hypertrophy of the left ventricle. Normal LV systolic function with EF 60%. Normal global wall motion. Doppler evidence of grade I (impaired) diastolic dysfunction, normal LAP. Calculated EF 60%. Left atrial cavity is moderately dilated. Aneurysmal interatrial septum without 2D or color Doppler evidence of interatrial shunt. Trileaflet aortic valve. Mild calcification of the aortic  valve annulus and leaflets.  Mild aortic stenosis. Aortic valve mean gradient of 10 mmHg, Vmax of 2.2  m/s. Calculated aortic valve area by continuity equation is 1.4 cm. Trace aortic regurgitation. Mild mitral valve leaflet calcification. Moderate (Grade II) mitral regurgitation. Mild tricuspid regurgitation.  No evidence of pulmonary hypertension.  Lexiscan Tetrofosmin Stress Test  03/24/2019: Resting EKG normal sinus rhythm, stress EKG nondiagnostic due to pharmacologic stress test using Lexiscan.  Status symptoms included dyspnea and dizziness. There is a prominent diaphragmatic attenuation artifact in the inferior wall. without ischemia or scar. Left ventricular ejection  fraction is  58% with normal wall motion. Low risk study. No previous exam available for comparison.  EKG 03/21/2019:  Sinus rhythm 79 bpm.  Normal EKG.   CTA chest 03/05/2019: No evidence of pulmonary embolism. Mild vascular congestion and edema with small pleural effusions. Aortic Atherosclerosis (ICD10-I70.0).  Echocardiogram 06/20/2017: - Left ventricle: The cavity size was normal. Wall thickness was   increased in a pattern of moderate LVH. Systolic function was   vigorous. The estimated ejection fraction was in the range of 65%   to 70%. Wall motion was normal; there were no regional wall   motion abnormalities. Doppler parameters are consistent with   abnormal left ventricular relaxation (grade 1 diastolic   dysfunction). - Aortic valve: Mildly to moderately calcified annulus. Moderately   thickened, mildly calcified leaflets. - Mitral valve: Mildly calcified annulus. There was mild   regurgitation.   Recent labs: 06/12/2019: Glucose 152, BUN/Cr 22/0.86. EGFR 70. Na/K 137/5.3  06/04/2019: Glucose 134, BUN/Cr 44/1.24. EGFR 45. Na/K 135/4.5  05/07/2019: Glucose 93, BUN/Cr 39/0.9. EGFR 62. Na/K 139/4.6. Rest of the CMP normal Chol 221, TG 111, HDL 72, LDL 130   Review of Systems  Cardiovascular: Positive for dyspnea on exertion and leg swelling. Negative for chest pain, palpitations and syncope.         Vitals:   06/26/19 1443  BP: (!) 148/66  Pulse: 84  Temp: (!) 97.3 F (36.3 C)  SpO2: 99%     Body mass index is 31.92 kg/m. Filed Weights   06/26/19 1443  Weight: 180 lb 3.2 oz (81.7 kg)     Objective:   Physical Exam  Constitutional: No distress.  Neck: No JVD present.  Cardiovascular: Normal rate, regular rhythm and intact distal pulses.  Murmur heard. High-pitched blowing holosystolic murmur is present with a grade of 2/6 at the apex. Pulmonary/Chest: Effort normal and breath sounds normal. She has no wheezes. She has no rales.    Musculoskeletal:        General: Edema (1+ bilateral) present.  Nursing note and vitals reviewed.         Assessment & Recommendations:   69 year old female with hypertension, diabetes mellitus, HFpEF, dilutional hyponatremia-now resolved.  HFpEF: Exertional dyspnea improved, but leg edema persists. Increase torsemide to 20 mg once daily.  Stopped supplement potassium, given potassium level of 5.3. Repeat BMP in 2 weeks  Acute kidney injury: Secondary to diuresis.  Now resolved.  Hypertension:  Suboptimal control.  Blood pressure remains elevated after increasing torsemide to daily use, will increase losartan to 100 mg daily.  She will need to repeat BMP after then.  Hyponatremia:  Likely dilutional, exacerbated by HCTZ which is now stopped.  Sodium is now in normal limit.    Fu in 3 weeks.   Nigel Mormon, MD [facility] [place] Pager: [phone] Office: [phone] If no answer Cell [phone]

## 2019-06-27 ENCOUNTER — Encounter: Payer: Self-pay | Admitting: Cardiology

## 2019-06-27 DIAGNOSIS — I10 Essential (primary) hypertension: Secondary | ICD-10-CM | POA: Insufficient documentation

## 2019-06-27 DIAGNOSIS — I5033 Acute on chronic diastolic (congestive) heart failure: Secondary | ICD-10-CM | POA: Insufficient documentation

## 2019-06-27 DIAGNOSIS — N179 Acute kidney failure, unspecified: Secondary | ICD-10-CM | POA: Insufficient documentation

## 2019-06-27 DIAGNOSIS — I5032 Chronic diastolic (congestive) heart failure: Secondary | ICD-10-CM | POA: Insufficient documentation

## 2019-06-29 ENCOUNTER — Ambulatory Visit: Payer: Medicare HMO

## 2019-07-02 DIAGNOSIS — E113512 Type 2 diabetes mellitus with proliferative diabetic retinopathy with macular edema, left eye: Secondary | ICD-10-CM | POA: Diagnosis not present

## 2019-07-04 ENCOUNTER — Ambulatory Visit: Payer: Medicare HMO | Attending: Internal Medicine

## 2019-07-04 DIAGNOSIS — Z23 Encounter for immunization: Secondary | ICD-10-CM | POA: Insufficient documentation

## 2019-07-04 NOTE — Progress Notes (Signed)
   Covid-19 Vaccination Clinic  Name:  Danielle Beck    MRN: [id number] DOB: Aug 13, 1950  07/04/2019  [name] was observed post Covid-19 immunization for 15 minutes without incidence. She was provided with Vaccine Information Sheet and instruction to access the V-Safe system.   [name] was instructed to call 911 with any severe reactions post vaccine: Marland Kitchen Difficulty breathing  . Swelling of your face and throat  . A fast heartbeat  . A bad rash all over your body  . Dizziness and weakness    Immunizations Administered    Name Date Dose VIS Date Route   Pfizer COVID-19 Vaccine 07/04/2019 11:40 AM 0.3 mL 05/09/2019 Intramuscular   Manufacturer: [place]   Lot: [id number]   [place]: [id number]

## 2019-07-10 ENCOUNTER — Ambulatory Visit: Payer: Medicare HMO

## 2019-07-10 DIAGNOSIS — I5032 Chronic diastolic (congestive) heart failure: Secondary | ICD-10-CM | POA: Diagnosis not present

## 2019-07-11 LAB — BRAIN NATRIURETIC PEPTIDE: BNP: 71.3 pg/mL (ref 0.0–100.0)

## 2019-07-11 LAB — BASIC METABOLIC PANEL
BUN/Creatinine Ratio: 30 — ABNORMAL HIGH (ref 12–28)
BUN: 26 mg/dL (ref 8–27)
CO2: 21 mmol/L (ref 20–29)
Calcium: 9.2 mg/dL (ref 8.7–10.3)
Chloride: 100 mmol/L (ref 96–106)
Creatinine, Ser: 0.86 mg/dL (ref 0.57–1.00)
GFR calc Af Amer: 81 mL/min/{1.73_m2} (ref >59)
GFR calc non Af Amer: 70 mL/min/{1.73_m2} (ref >59)
Glucose: 216 mg/dL — ABNORMAL HIGH (ref 65–99)
Potassium: 4.6 mmol/L (ref 3.5–5.2)
Sodium: 136 mmol/L (ref 134–144)

## 2019-07-16 ENCOUNTER — Other Ambulatory Visit: Payer: Self-pay

## 2019-07-16 ENCOUNTER — Telehealth: Payer: Medicare HMO | Admitting: Cardiology

## 2019-07-16 VITALS — BP 130/74

## 2019-07-16 DIAGNOSIS — I10 Essential (primary) hypertension: Secondary | ICD-10-CM | POA: Diagnosis not present

## 2019-07-16 DIAGNOSIS — I5032 Chronic diastolic (congestive) heart failure: Secondary | ICD-10-CM

## 2019-07-16 DIAGNOSIS — R600 Localized edema: Secondary | ICD-10-CM | POA: Diagnosis not present

## 2019-07-16 MED ORDER — TORSEMIDE 20 MG PO TABS: 20.0000 mg | ORAL_TABLET | ORAL | 2 refills | Status: DC

## 2019-07-16 MED ORDER — LOSARTAN POTASSIUM 50 MG PO TABS: 75.0000 mg | ORAL_TABLET | Freq: Every day | ORAL | 1 refills | Status: DC

## 2019-07-16 NOTE — Progress Notes (Signed)
Patient referred by Gaynelle Arabian, MD for shortness of breath  Subjective:   Danielle Beck, female    DOB: 11/19/1950, 69 y.o.   MRN: [id number]  I connected with the patient on 07/16/2019 by a video enabled telemedicine application and verified that I am speaking with the correct person using two identifiers.     I discussed the limitations of evaluation and management by telemedicine and the availability of in person appointments. The patient expressed understanding and agreed to proceed.   This visit type was conducted due to national recommendations for restrictions regarding the COVID-19 Pandemic (e.g. social distancing).  This format is felt to be most appropriate for this patient at this time.  All issues noted in this document were discussed and addressed.  No physical exam was performed (except for noted visual exam findings with Tele health visits).  The patient has consented to conduct a Tele health visit and understands insurance will be billed.    Chief Complaint  Patient presents with  . Hypertension    69 year old female with hypertension, diabetes mellitus, HFpEF, dilutional hyponatremia-now resolved.  Reviewed recent labs. Cr stable, BNP normal. BO has improved over last few days. She still has some leg swelling, but does not have any significant exertional dyspnea.     Current Outpatient Medications on File Prior to Visit  Medication Sig Dispense Refill  . amLODipine (NORVASC) 5 MG tablet Take 10 mg by mouth daily.     Marland Kitchen atorvastatin (LIPITOR) 10 MG tablet Take 10 mg by mouth daily.    . calcium-vitamin D (OSCAL WITH D) 250-125 MG-UNIT tablet Take 1 tablet by mouth 2 (two) times daily.     . dorzolamide (TRUSOPT) 2 % ophthalmic solution Place 1 drop into the left eye 2 (two) times daily.    . ferrous sulfate 324 MG TBEC Take 324 mg by mouth.    . folic acid (FOLVITE) 976 MCG tablet Take 400 mcg by mouth daily.    Marland Kitchen gabapentin (NEURONTIN) 400 MG capsule  Take 400 mg by mouth 3 (three) times daily.    Marland Kitchen LANTUS SOLOSTAR 100 UNIT/ML Solostar Pen Inject 30 Units into the skin daily.    Marland Kitchen latanoprost (XALATAN) 0.005 % ophthalmic solution Place 1 drop into both eyes at bedtime.     Marland Kitchen losartan (COZAAR) 50 MG tablet Take 1 tablet (50 mg total) by mouth daily. (Patient taking differently: Take 75 mg by mouth daily. ) 90 tablet 1  . metFORMIN (GLUCOPHAGE) 1000 MG tablet Take 1,000 mg by mouth 2 (two) times daily.    . potassium chloride (KLOR-CON) 10 MEQ tablet Take 1 tablet (10 mEq total) by mouth daily for 3 days. 3 tablet 0  . pyridOXINE (VITAMIN B-6) 100 MG tablet Take 100 mg by mouth daily.    Marland Kitchen thiamine (VITAMIN B-1) 100 MG tablet Take 100 mg by mouth daily.    . timolol (TIMOPTIC) 0.5 % ophthalmic solution Place 1 drop into the left eye 2 (two) times daily.    Marland Kitchen torsemide (DEMADEX) 20 MG tablet Take 20 mg by mouth daily. Currently taking one every other day    . vitamin B-12 (CYANOCOBALAMIN) 100 MCG tablet Take 100 mcg by mouth daily.     No current facility-administered medications on file prior to visit.    Cardiovascular studies:  Echocardiogram 04/01/2019: Left ventricle cavity is normal in size. Mild concentric hypertrophy of the left ventricle. Normal LV systolic function with EF 60%. Normal global wall  motion. Doppler evidence of grade I (impaired) diastolic dysfunction, normal LAP. Calculated EF 60%. Left atrial cavity is moderately dilated. Aneurysmal interatrial septum without 2D or color Doppler evidence of interatrial shunt. Trileaflet aortic valve. Mild calcification of the aortic valve annulus and leaflets.  Mild aortic stenosis. Aortic valve mean gradient of 10 mmHg, Vmax of 2.2  m/s. Calculated aortic valve area by continuity equation is 1.4 cm. Trace aortic regurgitation. Mild mitral valve leaflet calcification. Moderate (Grade II) mitral regurgitation. Mild tricuspid regurgitation.  No evidence of pulmonary  hypertension.  Lexiscan Tetrofosmin Stress Test  03/24/2019: Resting EKG normal sinus rhythm, stress EKG nondiagnostic due to pharmacologic stress test using Lexiscan.  Status symptoms included dyspnea and dizziness. There is a prominent diaphragmatic attenuation artifact in the inferior wall. without ischemia or scar. Left ventricular ejection fraction is  58% with normal wall motion. Low risk study. No previous exam available for comparison.  EKG 03/21/2019:  Sinus rhythm 79 bpm.  Normal EKG.   CTA chest 03/05/2019: No evidence of pulmonary embolism. Mild vascular congestion and edema with small pleural effusions. Aortic Atherosclerosis (ICD10-I70.0).  Echocardiogram 06/20/2017: - Left ventricle: The cavity size was normal. Wall thickness was   increased in a pattern of moderate LVH. Systolic function was   vigorous. The estimated ejection fraction was in the range of 65%   to 70%. Wall motion was normal; there were no regional wall   motion abnormalities. Doppler parameters are consistent with   abnormal left ventricular relaxation (grade 1 diastolic   dysfunction). - Aortic valve: Mildly to moderately calcified annulus. Moderately   thickened, mildly calcified leaflets. - Mitral valve: Mildly calcified annulus. There was mild   regurgitation.   Recent labs: 07/10/2019: Glucose 216, BUN/Cr 26/0.86. EGFR 70. Na/K 136/4.6 BNP 71  06/12/2019: Glucose 152, BUN/Cr 22/0.86. EGFR 70. Na/K 137/5.3  06/04/2019: Glucose 134, BUN/Cr 44/1.24. EGFR 45. Na/K 135/4.5  05/07/2019: Glucose 93, BUN/Cr 39/0.9. EGFR 62. Na/K 139/4.6. Rest of the CMP normal Chol 221, TG 111, HDL 72, LDL 130   Review of Systems  Cardiovascular: Positive for dyspnea on exertion (Improved) and leg swelling. Negative for chest pain, palpitations and syncope.         Vitals:   07/17/19 1454  BP: 130/74    Objective:   Physical Exam  Constitutional: No distress.  Neck: No JVD present.  Cardiovascular:  Normal rate, regular rhythm and intact distal pulses.  Murmur heard. High-pitched blowing holosystolic murmur is present with a grade of 2/6 at the apex. Pulmonary/Chest: Effort normal and breath sounds normal. She has no wheezes. She has no rales.  Musculoskeletal:        General: Edema (Left 1+) present.  Nursing note and vitals reviewed.         Assessment & Recommendations:   69 year old female with hypertension, diabetes mellitus, HFpEF, dilutional hyponatremia-now resolved.  HFpEF: Exertional dyspnea improved, but leg edema persists. Likely due to amlodipine. Would like to hold off any further changes to medications at this time.  Continue torsemide to 20 mg once daily.   Hypertension: Well controlled. Continue losartan 75 mg daily, amlodipine 10 mg daily. In future, if leg edema becomes more persistent, would increase losartan to 100 mg daily and reduce amlodipine to 5 mg daily.  VV in April 2021.  Nigel Mormon, MD [facility] [place] Pager: [phone] Office: [phone] If no answer Cell [phone]

## 2019-07-17 DIAGNOSIS — R600 Localized edema: Secondary | ICD-10-CM | POA: Insufficient documentation

## 2019-07-29 ENCOUNTER — Ambulatory Visit: Payer: Medicare HMO | Attending: Internal Medicine

## 2019-07-29 DIAGNOSIS — Z23 Encounter for immunization: Secondary | ICD-10-CM | POA: Insufficient documentation

## 2019-07-29 NOTE — Progress Notes (Signed)
   Covid-19 Vaccination [facility]  Name:  Danielle Beck    MRN: [id number] DOB: 1950-07-09  07/29/2019  Danielle Beck was observed post Covid-19 immunization for 15 minutes without incident. She was provided with Vaccine Information Sheet and instruction to access the V-Safe system.   Danielle Beck was instructed to call 911 with any severe reactions post vaccine: Marland Kitchen Difficulty breathing  . Swelling of face and throat  . A fast heartbeat  . A bad rash all over body  . Dizziness and weakness   Immunizations Administered    Name Date Dose VIS Date Route   Pfizer COVID-19 Vaccine 07/29/2019 10:52 AM 0.3 mL 05/09/2019 Intramuscular   Manufacturer: [place]   Lot: [id number]   [place]: [id number]

## 2019-08-06 DIAGNOSIS — E113511 Type 2 diabetes mellitus with proliferative diabetic retinopathy with macular edema, right eye: Secondary | ICD-10-CM | POA: Diagnosis not present

## 2019-08-27 DIAGNOSIS — E113512 Type 2 diabetes mellitus with proliferative diabetic retinopathy with macular edema, left eye: Secondary | ICD-10-CM | POA: Diagnosis not present

## 2019-09-09 NOTE — Progress Notes (Signed)
Patient referred by Gaynelle Arabian, MD for shortness of breath  Subjective:   Danielle Beck, female    DOB: [birth year], 69 y.o.   MRN: [id number]  I connected with the patient on 09/10/2019 by a video enabled telemedicine application and verified that I am speaking with the correct person using two identifiers.     I discussed the limitations of evaluation and management by telemedicine and the availability of in person appointments. The patient expressed understanding and agreed to proceed.   This visit type was conducted due to national recommendations for restrictions regarding the COVID-19 Pandemic (e.g. social distancing).  This format is felt to be most appropriate for this patient at this time.  All issues noted in this document were discussed and addressed.  No physical exam was performed (except for noted visual exam findings with Tele health visits).  The patient has consented to conduct a Tele health visit and understands insurance will be billed.    Chief Complaint  Patient presents with  . Shortness of Breath  . Leg Swelling  . Follow-up    69 year old female with hypertension, diabetes mellitus, HFpEF, dilutional hyponatremia-now resolved.  Since her last visit with me, she has been generally doing well.  She walks around the block every day without significant symptoms of shortness of breath.  She does note generalized fatigue.  She has noticed numbness and pain in her calves on walking, that improves with rest.  This pain also occurs while standing and working in the kitchen for long time.  She denies any back pain or radiculopathy.  Blood pressure is running 135-/150/75-80 mmHg.  Weight is 179 LB, up 3 pounds since last visit.  Leg edema is controlled with torsemide 20 mg daily, and additional 10 mg in the afternoon about once a week.  Husband reports to following episodes that he is concerned about.  Early March, few days after her second phase of vaccine,  patient had an episode of shortness of breath and tachycardia up to 150 bpm, while at supper table.  Episode lasted for about 30 minutes, and resolved on its own.  Patient felt tired thereafter.  Second episode occurred in early April.  Patient woke up to go to the bathroom middle of night, and felt short of breath.  At this time, her heart rate was around 70 bpm.   Current Outpatient Medications on File Prior to Visit  Medication Sig Dispense Refill  . amLODipine (NORVASC) 5 MG tablet Take 10 mg by mouth daily.     Marland Kitchen atorvastatin (LIPITOR) 10 MG tablet Take 10 mg by mouth daily.    . calcium-vitamin D (OSCAL WITH D) 250-125 MG-UNIT tablet Take 1 tablet by mouth 2 (two) times daily.     . dorzolamide (TRUSOPT) 2 % ophthalmic solution Place 1 drop into the left eye 2 (two) times daily.    . ferrous sulfate 324 MG TBEC Take 324 mg by mouth.    . folic acid (FOLVITE) 309 MCG tablet Take 400 mcg by mouth daily.    Marland Kitchen gabapentin (NEURONTIN) 400 MG capsule Take 400 mg by mouth 3 (three) times daily.    Marland Kitchen LANTUS SOLOSTAR 100 UNIT/ML Solostar Pen Inject 30 Units into the skin daily.    Marland Kitchen latanoprost (XALATAN) 0.005 % ophthalmic solution Place 1 drop into both eyes at bedtime.     Marland Kitchen losartan (COZAAR) 50 MG tablet Take 1.5 tablets (75 mg total) by mouth daily. 135 tablet 1  .  metFORMIN (GLUCOPHAGE) 1000 MG tablet Take 1,000 mg by mouth 2 (two) times daily.    Marland Kitchen pyridOXINE (VITAMIN B-6) 100 MG tablet Take 100 mg by mouth daily.    Marland Kitchen thiamine (VITAMIN B-1) 100 MG tablet Take 100 mg by mouth daily.    . timolol (TIMOPTIC) 0.5 % ophthalmic solution Place 1 drop into the left eye 2 (two) times daily.    Marland Kitchen torsemide (DEMADEX) 20 MG tablet Take 1 tablet (20 mg total) by mouth as directed. 1 every day. 1 additional dose if needed for leg swelling. 180 tablet 2  . vitamin B-12 (CYANOCOBALAMIN) 100 MCG tablet Take 100 mcg by mouth daily.     No current facility-administered medications on file prior to visit.     Cardiovascular studies:  Echocardiogram 04/01/2019: Left ventricle cavity is normal in size. Mild concentric hypertrophy of the left ventricle. Normal LV systolic function with EF 60%. Normal global wall motion. Doppler evidence of grade I (impaired) diastolic dysfunction, normal LAP. Calculated EF 60%. Left atrial cavity is moderately dilated. Aneurysmal interatrial septum without 2D or color Doppler evidence of interatrial shunt. Trileaflet aortic valve. Mild calcification of the aortic valve annulus and leaflets.  Mild aortic stenosis. Aortic valve mean gradient of 10 mmHg, Vmax of 2.2  m/s. Calculated aortic valve area by continuity equation is 1.4 cm. Trace aortic regurgitation. Mild mitral valve leaflet calcification. Moderate (Grade II) mitral regurgitation. Mild tricuspid regurgitation.  No evidence of pulmonary hypertension.  Lexiscan Tetrofosmin Stress Test  03/24/2019: Resting EKG normal sinus rhythm, stress EKG nondiagnostic due to pharmacologic stress test using Lexiscan.  Status symptoms included dyspnea and dizziness. There is a prominent diaphragmatic attenuation artifact in the inferior wall. without ischemia or scar. Left ventricular ejection fraction is  58% with normal wall motion. Low risk study. No previous exam available for comparison.  EKG 03/21/2019:  Sinus rhythm 79 bpm.  Normal EKG.   CTA chest 03/05/2019: No evidence of pulmonary embolism. Mild vascular congestion and edema with small pleural effusions. Aortic Atherosclerosis (ICD10-I70.0).  Echocardiogram 06/20/2017: - Left ventricle: The cavity size was normal. Wall thickness was   increased in a pattern of moderate LVH. Systolic function was   vigorous. The estimated ejection fraction was in the range of 65%   to 70%. Wall motion was normal; there were no regional wall   motion abnormalities. Doppler parameters are consistent with   abnormal left ventricular relaxation (grade 1 diastolic   dysfunction).  - Aortic valve: Mildly to moderately calcified annulus. Moderately   thickened, mildly calcified leaflets. - Mitral valve: Mildly calcified annulus. There was mild   regurgitation.   Recent labs: 07/10/2019: Glucose 216, BUN/Cr 26/0.86. EGFR 70. Na/K 136/4.6 BNP 71  06/12/2019: Glucose 152, BUN/Cr 22/0.86. EGFR 70. Na/K 137/5.3  06/04/2019: Glucose 134, BUN/Cr 44/1.24. EGFR 45. Na/K 135/4.5  05/07/2019: Glucose 93, BUN/Cr 39/0.9. EGFR 62. Na/K 139/4.6. Rest of the CMP normal Chol 221, TG 111, HDL 72, LDL 130   Review of Systems  Cardiovascular: Positive for dyspnea on exertion (Improved) and leg swelling. Negative for chest pain, palpitations and syncope.         Vitals:   09/10/19 0937  BP: (!) 148/76  Pulse: 88   Filed Weights   09/10/19 0937  Weight: 179 lb (81.2 kg)     Objective:   Physical Exam  Constitutional: She is oriented to person, place, and time. She appears well-developed and well-nourished. No distress.  Pulmonary/Chest: Effort normal.  Musculoskeletal:  General: Edema (1+ LLE, trace RLE edema) present.  Neurological: She is alert and oriented to person, place, and time.  Psychiatric: She has a normal mood and affect.  Nursing note and vitals reviewed.         Assessment & Recommendations:   69 year old female with hypertension, diabetes mellitus, HFpEF, dilutional hyponatremia-now resolved.  HFpEF: Exertional dyspnea improved, but leg edema persists.  Aggressive blood pressure control, low-salt diet, and diuretic therapy. Continue torsemide 20 mg in the morning, additional 10 mg in the afternoon as needed.  See below for blood pressure control.    Hypertension: Increase losartan to 100 mg daily.  Continue amlodipine 10 mg daily.    Tachycardia, irregular heartbeat: At least one episode is suspicious for atrial fibrillation.  Recommend RCT monitoring for 30 days to evaluate for A. Fib.  Claudication: Vascular claudication is in  the differential.  Recommend screening ABI.  Follow-up with me in June.  Nigel Mormon, MD [facility] Pager: [phone] Office: [phone] If no answer Cell [phone]

## 2019-09-10 ENCOUNTER — Encounter: Payer: Self-pay | Admitting: Cardiology

## 2019-09-10 ENCOUNTER — Telehealth: Payer: Medicare HMO | Admitting: Cardiology

## 2019-09-10 ENCOUNTER — Other Ambulatory Visit: Payer: Self-pay

## 2019-09-10 VITALS — BP 148/76 | HR 88 | Ht 63.0 in | Wt 179.0 lb

## 2019-09-10 DIAGNOSIS — I5032 Chronic diastolic (congestive) heart failure: Secondary | ICD-10-CM

## 2019-09-10 DIAGNOSIS — I10 Essential (primary) hypertension: Secondary | ICD-10-CM | POA: Diagnosis not present

## 2019-09-10 DIAGNOSIS — R000 Tachycardia, unspecified: Secondary | ICD-10-CM

## 2019-09-10 DIAGNOSIS — R002 Palpitations: Secondary | ICD-10-CM

## 2019-09-10 DIAGNOSIS — I739 Peripheral vascular disease, unspecified: Secondary | ICD-10-CM

## 2019-09-12 ENCOUNTER — Other Ambulatory Visit: Payer: Self-pay

## 2019-09-12 ENCOUNTER — Ambulatory Visit: Payer: Medicare HMO

## 2019-09-12 DIAGNOSIS — I739 Peripheral vascular disease, unspecified: Secondary | ICD-10-CM

## 2019-09-23 ENCOUNTER — Other Ambulatory Visit: Payer: Self-pay

## 2019-09-23 ENCOUNTER — Ambulatory Visit: Payer: Medicare HMO

## 2019-09-23 DIAGNOSIS — R002 Palpitations: Secondary | ICD-10-CM

## 2019-09-23 DIAGNOSIS — R000 Tachycardia, unspecified: Secondary | ICD-10-CM

## 2019-10-03 ENCOUNTER — Other Ambulatory Visit: Payer: Self-pay

## 2019-10-03 MED ORDER — LOSARTAN POTASSIUM 100 MG PO TABS: 100.0000 mg | ORAL_TABLET | Freq: Every day | ORAL | 3 refills | Status: DC

## 2019-10-07 ENCOUNTER — Other Ambulatory Visit: Payer: Self-pay

## 2019-10-07 DIAGNOSIS — E113511 Type 2 diabetes mellitus with proliferative diabetic retinopathy with macular edema, right eye: Secondary | ICD-10-CM | POA: Diagnosis not present

## 2019-10-07 MED ORDER — LOSARTAN POTASSIUM 100 MG PO TABS: 100.0000 mg | ORAL_TABLET | Freq: Every day | ORAL | 3 refills | Status: DC

## 2019-10-08 DIAGNOSIS — R000 Tachycardia, unspecified: Secondary | ICD-10-CM | POA: Diagnosis not present

## 2019-10-14 ENCOUNTER — Telehealth: Payer: Self-pay

## 2019-10-14 NOTE — Progress Notes (Signed)
Patient referred by Gaynelle Arabian, MD for shortness of breath  Subjective:   Danielle Beck, female    DOB: [birth year], 68 y.o.   MRN: [id number]  I connected with the patient on 10/15/2019 by a telephone call and verified that I am speaking with the correct person using two identifiers.     I offered the patient a video enabled application for a virtual visit. Unfortunately, this could not be accomplished due to technical difficulties/lack of video enabled phone/computer. I discussed the limitations of evaluation and management by telemedicine and the availability of in person appointments. The patient expressed understanding and agreed to proceed.   This visit type was conducted due to national recommendations for restrictions regarding the COVID-19 Pandemic (e.g. social distancing).  This format is felt to be most appropriate for this patient at this time.  All issues noted in this document were discussed and addressed.  No physical exam was performed (except for noted visual exam findings with Tele health visits).  The patient has consented to conduct a Tele health visit and understands insurance will be billed.    Chief Complaint  Patient presents with  . Hypertension  . Follow-up  . Results    monitor   HPI  69 y.o. female with hypertension, diabetes mellitus, HFpEF, now with palpitations  At last visit in 08/2019, I increased her diuretics due to persistent edema. Additionally I increased her Losartan dosage to improve her blood pressure. I also recommended RCT monitoring for 30 days to evaluate for A-fib. ABI screening was performed for vascular claudication differential.  ABI was normal. ROCT showed atrial fib/flutter, as well as probable SVT. Patient has had intermittent episodes of palpitations, associated with shortness of breath and leg edema.     Current Outpatient Medications on File Prior to Visit  Medication Sig Dispense Refill  . amLODipine (NORVASC) 10 MG  tablet Take 10 mg by mouth daily.     Marland Kitchen atorvastatin (LIPITOR) 10 MG tablet Take 10 mg by mouth daily.    . calcium-vitamin D (OSCAL WITH D) 250-125 MG-UNIT tablet Take 1 tablet by mouth 2 (two) times daily.     . dorzolamide (TRUSOPT) 2 % ophthalmic solution Place 1 drop into the left eye 2 (two) times daily.    . ferrous sulfate 324 MG TBEC Take 324 mg by mouth.    . folic acid (FOLVITE) 793 MCG tablet Take 400 mcg by mouth daily.    Marland Kitchen gabapentin (NEURONTIN) 400 MG capsule Take 400 mg by mouth 3 (three) times daily.    Marland Kitchen LANTUS SOLOSTAR 100 UNIT/ML Solostar Pen Inject 30 Units into the skin daily.    Marland Kitchen latanoprost (XALATAN) 0.005 % ophthalmic solution Place 1 drop into both eyes at bedtime.     Marland Kitchen losartan (COZAAR) 100 MG tablet Take 1 tablet (100 mg total) by mouth daily. 90 tablet 3  . pyridOXINE (VITAMIN B-6) 100 MG tablet Take 100 mg by mouth daily.    Marland Kitchen thiamine (VITAMIN B-1) 100 MG tablet Take 100 mg by mouth daily.    . timolol (TIMOPTIC) 0.5 % ophthalmic solution Place 1 drop into the left eye 2 (two) times daily.    Marland Kitchen torsemide (DEMADEX) 20 MG tablet Take 1 tablet (20 mg total) by mouth as directed. 1 every day. 1 additional dose if needed for leg swelling. 180 tablet 2  . vitamin B-12 (CYANOCOBALAMIN) 100 MCG tablet Take 100 mcg by mouth daily.     No current  facility-administered medications on file prior to visit.    Cardiovascular studies:  Real time outpatient cardiac telemetry 09/23/2019: Monitoring period 360 hr 41 min HR 56-170 bpm. Avg HR 83 bpm Rare ventricular ectopy (PVC/pair) Frequent PAC's and occasional atrial bigeminy in sinus rhythm.  Multiple runs of SVT at 150 bpm with associated T wave inversions, followed by coarse Afib/atrial flutter episodes SVT burden <1%, Afib burden <1% No symptoms associated with above arrhythmia. Patient activated events correlate with sinus rhythm. No VT/high grade AV block, sinus pause >3sec noted.  ABI 09/12/2019:  This exam  reveals normal perfusion of the lower extremity (ABI 1.07 bilateral).  Triphasic (normal) waveform noted at the level of the ankle.  Echocardiogram 04/01/2019: Left ventricle cavity is normal in size. Mild concentric hypertrophy of the left ventricle. Normal LV systolic function with EF 60%. Normal global wall motion. Doppler evidence of grade I (impaired) diastolic dysfunction, normal LAP. Calculated EF 60%. Left atrial cavity is moderately dilated. Aneurysmal interatrial septum without 2D or color Doppler evidence of interatrial shunt. Trileaflet aortic valve. Mild calcification of the aortic valve annulus and leaflets.  Mild aortic stenosis. Aortic valve mean gradient of 10 mmHg, Vmax of 2.2  m/s. Calculated aortic valve area by continuity equation is 1.4 cm. Trace aortic regurgitation. Mild mitral valve leaflet calcification. Moderate (Grade II) mitral regurgitation. Mild tricuspid regurgitation.  No evidence of pulmonary hypertension.  Lexiscan Tetrofosmin Stress Test  03/24/2019: Resting EKG normal sinus rhythm, stress EKG nondiagnostic due to pharmacologic stress test using Lexiscan.  Status symptoms included dyspnea and dizziness. There is a prominent diaphragmatic attenuation artifact in the inferior wall. without ischemia or scar. Left ventricular ejection fraction is  58% with normal wall motion. Low risk study. No previous exam available for comparison.  EKG 03/21/2019:  Sinus rhythm 79 bpm.  Normal EKG.   CTA chest 03/05/2019: No evidence of pulmonary embolism. Mild vascular congestion and edema with small pleural effusions. Aortic Atherosclerosis.  Recent labs:  07/10/2019: Glucose 216, BUN/Cr 26/0.86. EGFR 70. Na/K 136/4.6. BNP 71.  06/12/2019: Glucose 152, BUN/Cr 22/0.86. EGFR 70. Na/K 137/5.3.  06/04/2019: Glucose 134, BUN/Cr 44/1.24. EGFR 45. Na/K 135/4.5.  05/07/2019: Glucose 93, BUN/Cr 39/0.9. EGFR 62. Na/K 139/4.6. Rest of the CMP normal Chol 221, TG 111, HDL  72, LDL 130.   Review of Systems  Cardiovascular: Positive for dyspnea on exertion (Improved) and leg swelling. Negative for chest pain, palpitations and syncope.       Vitals not available  Objective:   Physical Exam  Not performed. Telephone visit.         Assessment & Recommendations:   69 y.o. female with hypertension, diabetes mellitus, HFpEF, now with palpitations  Paroxysmal Afib/flutter: Multiple episodes of the above and probable SVT. However, burden remains <1%. I discussed rate control vs rhythm control options with the patient. Patient would prefer conservative medical management with rate control at this time. Started metoprolol tartarate 25 mg bid. CHA2DS2VASc score 4, annual stroke risk 5%. Started Xarelto 20 mg daily. Given her h/o unspecified normocytic anemia, will btain baseline CBC and repeat in 3 months  HFpEF: Aggressive blood pressure control, low-salt diet, and diuretic therapy. Continue torsemide 20 mg in the morning, additional 10 mg in the afternoon as needed.  See below for blood pressure control.    Hypertension: Continue current antihypertensive therapy.  Claudication: Normal ABI. Appears pseudoclaudication  Follow-up in August after repeat CBC and BMP  Vinal Rosengrant Esther Hardy, MD [facility] Pager: [phone] Office: [phone]  If no answer Cell [phone]

## 2019-10-14 NOTE — Telephone Encounter (Signed)
Take an extra dose of torsemide today. Will discuss more at length at tomorrow's visit.  Thanks MJP

## 2019-10-14 NOTE — Telephone Encounter (Signed)
Pt husband called to inform us that she has had some SOB this afternoon, and irregular heart 90, has a little headache, pt denies cp, dizzy, nausea. Please advise

## 2019-10-14 NOTE — Telephone Encounter (Signed)
Called pt husband to inform him about message above

## 2019-10-15 ENCOUNTER — Encounter: Payer: Self-pay | Admitting: Cardiology

## 2019-10-15 ENCOUNTER — Telehealth: Payer: Self-pay

## 2019-10-15 ENCOUNTER — Telehealth: Payer: Medicare HMO | Admitting: Cardiology

## 2019-10-15 VITALS — BP 139/73 | HR 82 | Ht 63.0 in | Wt 179.0 lb

## 2019-10-15 DIAGNOSIS — D649 Anemia, unspecified: Secondary | ICD-10-CM

## 2019-10-15 DIAGNOSIS — I480 Paroxysmal atrial fibrillation: Secondary | ICD-10-CM

## 2019-10-15 DIAGNOSIS — I5032 Chronic diastolic (congestive) heart failure: Secondary | ICD-10-CM

## 2019-10-15 DIAGNOSIS — R002 Palpitations: Secondary | ICD-10-CM | POA: Insufficient documentation

## 2019-10-15 MED ORDER — RIVAROXABAN 20 MG PO TABS: 20.0000 mg | ORAL_TABLET | Freq: Every day | ORAL | 2 refills | Status: DC

## 2019-10-15 MED ORDER — METOPROLOL TARTRATE 25 MG PO TABS: 25.0000 mg | ORAL_TABLET | Freq: Two times a day (BID) | ORAL | 3 refills | Status: DC

## 2019-10-20 DIAGNOSIS — D649 Anemia, unspecified: Secondary | ICD-10-CM | POA: Diagnosis not present

## 2019-10-20 DIAGNOSIS — I480 Paroxysmal atrial fibrillation: Secondary | ICD-10-CM | POA: Diagnosis not present

## 2019-10-21 ENCOUNTER — Other Ambulatory Visit: Payer: Self-pay

## 2019-10-21 DIAGNOSIS — I480 Paroxysmal atrial fibrillation: Secondary | ICD-10-CM

## 2019-10-21 LAB — CBC
Hematocrit: 30.2 % — ABNORMAL LOW (ref 34.0–46.6)
Hemoglobin: 10 g/dL — ABNORMAL LOW (ref 11.1–15.9)
MCH: 30.9 pg (ref 26.6–33.0)
MCHC: 33.1 g/dL (ref 31.5–35.7)
MCV: 93 fL (ref 79–97)
Platelets: 272 10*3/uL (ref 150–450)
RBC: 3.24 x10E6/uL — ABNORMAL LOW (ref 3.77–5.28)
RDW: 12.1 % (ref 11.7–15.4)
WBC: 10.5 10*3/uL (ref 3.4–10.8)

## 2019-10-21 LAB — BASIC METABOLIC PANEL
BUN/Creatinine Ratio: 44 — ABNORMAL HIGH (ref 12–28)
BUN: 47 mg/dL — ABNORMAL HIGH (ref 8–27)
CO2: 21 mmol/L (ref 20–29)
Calcium: 9.2 mg/dL (ref 8.7–10.3)
Chloride: 98 mmol/L (ref 96–106)
Creatinine, Ser: 1.06 mg/dL — ABNORMAL HIGH (ref 0.57–1.00)
GFR calc Af Amer: 62 mL/min/{1.73_m2} (ref >59)
GFR calc non Af Amer: 54 mL/min/{1.73_m2} — ABNORMAL LOW (ref >59)
Glucose: 235 mg/dL — ABNORMAL HIGH (ref 65–99)
Potassium: 4.7 mmol/L (ref 3.5–5.2)
Sodium: 133 mmol/L — ABNORMAL LOW (ref 134–144)

## 2019-10-21 MED ORDER — METOPROLOL TARTRATE 25 MG PO TABS: 25.0000 mg | ORAL_TABLET | Freq: Two times a day (BID) | ORAL | 3 refills | Status: DC

## 2019-10-21 MED ORDER — RIVAROXABAN 20 MG PO TABS: 20.0000 mg | ORAL_TABLET | Freq: Every day | ORAL | 2 refills | Status: DC

## 2019-10-30 DIAGNOSIS — E113512 Type 2 diabetes mellitus with proliferative diabetic retinopathy with macular edema, left eye: Secondary | ICD-10-CM | POA: Diagnosis not present

## 2019-11-12 ENCOUNTER — Ambulatory Visit: Payer: Medicare HMO | Admitting: Cardiology

## 2019-11-26 DIAGNOSIS — D6869 Other thrombophilia: Secondary | ICD-10-CM | POA: Diagnosis not present

## 2019-11-26 DIAGNOSIS — E7800 Pure hypercholesterolemia, unspecified: Secondary | ICD-10-CM | POA: Diagnosis not present

## 2019-11-26 DIAGNOSIS — E1142 Type 2 diabetes mellitus with diabetic polyneuropathy: Secondary | ICD-10-CM | POA: Diagnosis not present

## 2019-11-26 DIAGNOSIS — I480 Paroxysmal atrial fibrillation: Secondary | ICD-10-CM | POA: Diagnosis not present

## 2019-11-26 DIAGNOSIS — E11311 Type 2 diabetes mellitus with unspecified diabetic retinopathy with macular edema: Secondary | ICD-10-CM | POA: Diagnosis not present

## 2019-11-26 DIAGNOSIS — M79604 Pain in right leg: Secondary | ICD-10-CM | POA: Diagnosis not present

## 2019-11-26 DIAGNOSIS — E1139 Type 2 diabetes mellitus with other diabetic ophthalmic complication: Secondary | ICD-10-CM | POA: Diagnosis not present

## 2019-11-26 DIAGNOSIS — I10 Essential (primary) hypertension: Secondary | ICD-10-CM | POA: Diagnosis not present

## 2019-11-26 DIAGNOSIS — I5032 Chronic diastolic (congestive) heart failure: Secondary | ICD-10-CM | POA: Diagnosis not present

## 2019-11-26 DIAGNOSIS — Z1389 Encounter for screening for other disorder: Secondary | ICD-10-CM | POA: Diagnosis not present

## 2019-11-26 DIAGNOSIS — I700 Atherosclerosis of aorta: Secondary | ICD-10-CM | POA: Diagnosis not present

## 2019-11-26 DIAGNOSIS — Z0000 Encounter for general adult medical examination without abnormal findings: Secondary | ICD-10-CM | POA: Diagnosis not present

## 2019-11-26 DIAGNOSIS — Z794 Long term (current) use of insulin: Secondary | ICD-10-CM | POA: Diagnosis not present

## 2019-11-26 DIAGNOSIS — E1149 Type 2 diabetes mellitus with other diabetic neurological complication: Secondary | ICD-10-CM | POA: Diagnosis not present

## 2019-12-16 DIAGNOSIS — E113511 Type 2 diabetes mellitus with proliferative diabetic retinopathy with macular edema, right eye: Secondary | ICD-10-CM | POA: Diagnosis not present

## 2020-01-08 DIAGNOSIS — H401132 Primary open-angle glaucoma, bilateral, moderate stage: Secondary | ICD-10-CM | POA: Diagnosis not present

## 2020-01-08 DIAGNOSIS — H25813 Combined forms of age-related cataract, bilateral: Secondary | ICD-10-CM | POA: Diagnosis not present

## 2020-01-08 DIAGNOSIS — H3582 Retinal ischemia: Secondary | ICD-10-CM | POA: Diagnosis not present

## 2020-01-08 DIAGNOSIS — E113513 Type 2 diabetes mellitus with proliferative diabetic retinopathy with macular edema, bilateral: Secondary | ICD-10-CM | POA: Diagnosis not present

## 2020-01-12 DIAGNOSIS — E113511 Type 2 diabetes mellitus with proliferative diabetic retinopathy with macular edema, right eye: Secondary | ICD-10-CM | POA: Diagnosis not present

## 2020-01-16 ENCOUNTER — Ambulatory Visit: Payer: Medicare HMO | Admitting: Cardiology

## 2020-01-21 ENCOUNTER — Ambulatory Visit: Payer: Medicare HMO | Admitting: Cardiology

## 2020-01-22 DIAGNOSIS — H25813 Combined forms of age-related cataract, bilateral: Secondary | ICD-10-CM | POA: Diagnosis not present

## 2020-01-22 DIAGNOSIS — H40053 Ocular hypertension, bilateral: Secondary | ICD-10-CM | POA: Diagnosis not present

## 2020-01-25 NOTE — Progress Notes (Signed)
Patient referred by Gaynelle Arabian, MD for shortness of breath  Subjective:   Danielle Beck, female    DOB: [birth year], 69 y.o.   MRN: [id number]   Chief Complaint  Patient presents with  . PAF  . Follow-up    3 month  . Hypertension   HPI  69 y.o. female with hypertension, diabetes mellitus, HFpEF, paroxysmal Afib/flutter, PSVT  Patient is here with her husband. Overall, she is doing well. Leg edema is controlled on most days. She takes torsemide 20 mg daily, 1/2 tab every other day. Blood pressure is trending upwards. Palpitations have improved.   Current Outpatient Medications on File Prior to Visit  Medication Sig Dispense Refill  . amLODipine (NORVASC) 10 MG tablet Take 10 mg by mouth daily.     Marland Kitchen atorvastatin (LIPITOR) 10 MG tablet Take 10 mg by mouth daily.    . calcium-vitamin D (OSCAL WITH D) 250-125 MG-UNIT tablet Take 1 tablet by mouth 2 (two) times daily.     . dorzolamide (TRUSOPT) 2 % ophthalmic solution Place 1 drop into the left eye 2 (two) times daily.    . ferrous sulfate 324 MG TBEC Take 324 mg by mouth.    . folic acid (FOLVITE) 423 MCG tablet Take 400 mcg by mouth daily.    Marland Kitchen gabapentin (NEURONTIN) 400 MG capsule Take 400 mg by mouth 3 (three) times daily.    Marland Kitchen LANTUS SOLOSTAR 100 UNIT/ML Solostar Pen Inject 30 Units into the skin daily.    Marland Kitchen latanoprost (XALATAN) 0.005 % ophthalmic solution Place 1 drop into both eyes at bedtime.     Marland Kitchen losartan (COZAAR) 100 MG tablet Take 1 tablet (100 mg total) by mouth daily. 90 tablet 3  . metoprolol tartrate (LOPRESSOR) 25 MG tablet Take 1 tablet (25 mg total) by mouth 2 (two) times daily. 180 tablet 3  . pyridOXINE (VITAMIN B-6) 100 MG tablet Take 100 mg by mouth daily.    . rivaroxaban (XARELTO) 20 MG TABS tablet Take 1 tablet (20 mg total) by mouth daily with supper. 90 tablet 2  . thiamine (VITAMIN B-1) 100 MG tablet Take 100 mg by mouth daily.    . timolol (TIMOPTIC) 0.5 % ophthalmic solution Place 1  drop into the left eye 2 (two) times daily.    Marland Kitchen torsemide (DEMADEX) 20 MG tablet Take 1 tablet (20 mg total) by mouth as directed. 1 every day. 1 additional dose if needed for leg swelling. 180 tablet 2  . vitamin B-12 (CYANOCOBALAMIN) 100 MCG tablet Take 100 mcg by mouth daily.     No current facility-administered medications on file prior to visit.    Cardiovascular studies:  EKG 01/26/2020: Sinus rhythm 71 bpm Possible old anterseptal infarct Low voltage in precordial leads Poor R wave progression  Real time outpatient cardiac telemetry 09/23/2019: Monitoring period 360 hr 41 min HR 56-170 bpm. Avg HR 83 bpm Rare ventricular ectopy (PVC/pair) Frequent PAC's and occasional atrial bigeminy in sinus rhythm.  Multiple runs of SVT at 150 bpm with associated T wave inversions, followed by coarse Afib/atrial flutter episodes SVT burden <1%, Afib burden <1% No symptoms associated with above arrhythmia. Patient activated events correlate with sinus rhythm. No VT/high grade AV block, sinus pause >3sec noted.  ABI 09/12/2019:  This exam reveals normal perfusion of the lower extremity (ABI 1.07 bilateral).  Triphasic (normal) waveform noted at the level of the ankle.  Echocardiogram 04/01/2019: Left ventricle cavity is normal in size. Mild concentric  hypertrophy of the left ventricle. Normal LV systolic function with EF 60%. Normal global wall motion. Doppler evidence of grade I (impaired) diastolic dysfunction, normal LAP. Calculated EF 60%. Left atrial cavity is moderately dilated. Aneurysmal interatrial septum without 2D or color Doppler evidence of interatrial shunt. Trileaflet aortic valve. Mild calcification of the aortic valve annulus and leaflets.  Mild aortic stenosis. Aortic valve mean gradient of 10 mmHg, Vmax of 2.2  m/s. Calculated aortic valve area by continuity equation is 1.4 cm. Trace aortic regurgitation. Mild mitral valve leaflet calcification. Moderate (Grade II) mitral  regurgitation. Mild tricuspid regurgitation.  No evidence of pulmonary hypertension.  Lexiscan Tetrofosmin Stress Test  03/24/2019: Resting EKG normal sinus rhythm, stress EKG nondiagnostic due to pharmacologic stress test using Lexiscan.  Status symptoms included dyspnea and dizziness. There is a prominent diaphragmatic attenuation artifact in the inferior wall. without ischemia or scar. Left ventricular ejection fraction is  58% with normal wall motion. Low risk study. No previous exam available for comparison.  EKG 03/21/2019:  Sinus rhythm 79 bpm.  Normal EKG.   CTA chest 03/05/2019: No evidence of pulmonary embolism. Mild vascular congestion and edema with small pleural effusions. Aortic Atherosclerosis.  Recent labs: 11/26/2019: Glucose 144, BUN/Cr 27/1.01. EGFR 54. Na/K 137/5.2. Rest of the CMP normal Chol 202, TG 120, HDL 79, LDL 103  07/10/2019: Glucose 216, BUN/Cr 26/0.86. EGFR 70. Na/K 136/4.6. BNP 71.  06/12/2019: Glucose 152, BUN/Cr 22/0.86. EGFR 70. Na/K 137/5.3.  06/04/2019: Glucose 134, BUN/Cr 44/1.24. EGFR 45. Na/K 135/4.5.  05/07/2019: Glucose 93, BUN/Cr 39/0.9. EGFR 62. Na/K 139/4.6. Rest of the CMP normal Chol 221, TG 111, HDL 72, LDL 130.   Review of Systems  Cardiovascular: Positive for dyspnea on exertion (Improved) and leg swelling. Negative for chest pain, palpitations and syncope.       Vitals:   01/26/20 1017  BP: (!) 144/60  Pulse: 73  Resp: 16  SpO2: 97%     Objective:   Physical Exam Vitals and nursing note reviewed.  Constitutional:      General: She is not in acute distress. Neck:     Vascular: No JVD.  Cardiovascular:     Rate and Rhythm: Normal rate and regular rhythm.     Heart sounds: Normal heart sounds. No murmur heard.   Pulmonary:     Effort: Pulmonary effort is normal.     Breath sounds: Normal breath sounds. No wheezing or rales.  Musculoskeletal:     Right lower leg: Edema (1+) present.     Left lower leg: Edema  (1+) present.             Assessment & Recommendations:   69 y.o. female with hypertension, diabetes mellitus, HFpEF, paroxysmal Afib/flutter, PSVT  Paroxysmal Afib/flutter + PSVT: Improved. CHA2DS2VASc score 4, annual stroke risk 5%. Continue Xarelto 20 mg daily. Xarelto is expensive. Patient will find out from [facility], if eliquis will be cheaper.  Given her h/o unspecified normocytic anemia, will obtain repeat CBC  HFpEF: Aggressive blood pressure control, low-salt diet, and diuretic therapy. Recommend torsemide 20 mg daily, and 10 mg M,W,F  Hypertension: Continue current antihypertensive therapy. Hopefully increased torsemide dose will help.   F/u in Nov after repeat echocardiogram  Nigel Mormon, MD [facility] Pager: [phone] Office: [phone] If no answer Cell [phone]

## 2020-01-26 ENCOUNTER — Ambulatory Visit: Payer: Medicare HMO | Admitting: Cardiology

## 2020-01-26 ENCOUNTER — Other Ambulatory Visit: Payer: Self-pay

## 2020-01-26 ENCOUNTER — Encounter: Payer: Self-pay | Admitting: Cardiology

## 2020-01-26 VITALS — BP 144/60 | HR 73 | Resp 16 | Ht 63.0 in | Wt 178.0 lb

## 2020-01-26 DIAGNOSIS — D649 Anemia, unspecified: Secondary | ICD-10-CM

## 2020-01-26 DIAGNOSIS — I5032 Chronic diastolic (congestive) heart failure: Secondary | ICD-10-CM

## 2020-01-26 DIAGNOSIS — I480 Paroxysmal atrial fibrillation: Secondary | ICD-10-CM | POA: Diagnosis not present

## 2020-01-26 DIAGNOSIS — I10 Essential (primary) hypertension: Secondary | ICD-10-CM

## 2020-01-26 DIAGNOSIS — R600 Localized edema: Secondary | ICD-10-CM | POA: Diagnosis not present

## 2020-01-26 MED ORDER — TORSEMIDE 20 MG PO TABS: 20.0000 mg | ORAL_TABLET | ORAL | 2 refills | Status: DC

## 2020-01-28 DIAGNOSIS — E113512 Type 2 diabetes mellitus with proliferative diabetic retinopathy with macular edema, left eye: Secondary | ICD-10-CM | POA: Diagnosis not present

## 2020-01-30 DIAGNOSIS — E113511 Type 2 diabetes mellitus with proliferative diabetic retinopathy with macular edema, right eye: Secondary | ICD-10-CM | POA: Diagnosis not present

## 2020-02-03 DIAGNOSIS — I5032 Chronic diastolic (congestive) heart failure: Secondary | ICD-10-CM | POA: Diagnosis not present

## 2020-02-03 DIAGNOSIS — D649 Anemia, unspecified: Secondary | ICD-10-CM | POA: Diagnosis not present

## 2020-02-04 LAB — BASIC METABOLIC PANEL
BUN/Creatinine Ratio: 28 (ref 12–28)
BUN: 23 mg/dL (ref 8–27)
CO2: 22 mmol/L (ref 20–29)
Calcium: 8.8 mg/dL (ref 8.7–10.3)
Chloride: 89 mmol/L — ABNORMAL LOW (ref 96–106)
Creatinine, Ser: 0.81 mg/dL (ref 0.57–1.00)
GFR calc Af Amer: 86 mL/min/{1.73_m2} (ref >59)
GFR calc non Af Amer: 75 mL/min/{1.73_m2} (ref >59)
Glucose: 91 mg/dL (ref 65–99)
Potassium: 4.6 mmol/L (ref 3.5–5.2)
Sodium: 121 mmol/L — ABNORMAL LOW (ref 134–144)

## 2020-02-04 LAB — CBC
Hematocrit: 31.3 % — ABNORMAL LOW (ref 34.0–46.6)
Hemoglobin: 10.2 g/dL — ABNORMAL LOW (ref 11.1–15.9)
MCH: 29.5 pg (ref 26.6–33.0)
MCHC: 32.6 g/dL (ref 31.5–35.7)
MCV: 91 fL (ref 79–97)
Platelets: 307 10*3/uL (ref 150–450)
RBC: 3.46 x10E6/uL — ABNORMAL LOW (ref 3.77–5.28)
RDW: 12.9 % (ref 11.7–15.4)
WBC: 10.2 10*3/uL (ref 3.4–10.8)

## 2020-02-27 DIAGNOSIS — E113512 Type 2 diabetes mellitus with proliferative diabetic retinopathy with macular edema, left eye: Secondary | ICD-10-CM | POA: Diagnosis not present

## 2020-03-02 DIAGNOSIS — E113511 Type 2 diabetes mellitus with proliferative diabetic retinopathy with macular edema, right eye: Secondary | ICD-10-CM | POA: Diagnosis not present

## 2020-03-05 DIAGNOSIS — H25812 Combined forms of age-related cataract, left eye: Secondary | ICD-10-CM | POA: Diagnosis not present

## 2020-03-26 DIAGNOSIS — H25811 Combined forms of age-related cataract, right eye: Secondary | ICD-10-CM | POA: Diagnosis not present

## 2020-03-30 ENCOUNTER — Ambulatory Visit: Payer: Medicare HMO

## 2020-03-30 ENCOUNTER — Other Ambulatory Visit: Payer: Self-pay

## 2020-03-30 DIAGNOSIS — I5032 Chronic diastolic (congestive) heart failure: Secondary | ICD-10-CM | POA: Diagnosis not present

## 2020-04-02 ENCOUNTER — Encounter: Payer: Self-pay | Admitting: Cardiology

## 2020-04-02 ENCOUNTER — Ambulatory Visit: Payer: Medicare HMO | Admitting: Cardiology

## 2020-04-02 ENCOUNTER — Other Ambulatory Visit: Payer: Self-pay

## 2020-04-02 VITALS — BP 141/57 | HR 64 | Resp 16 | Ht 63.0 in | Wt 186.0 lb

## 2020-04-02 DIAGNOSIS — I340 Nonrheumatic mitral (valve) insufficiency: Secondary | ICD-10-CM | POA: Insufficient documentation

## 2020-04-02 DIAGNOSIS — I5032 Chronic diastolic (congestive) heart failure: Secondary | ICD-10-CM

## 2020-04-02 DIAGNOSIS — E871 Hypo-osmolality and hyponatremia: Secondary | ICD-10-CM | POA: Diagnosis not present

## 2020-04-02 DIAGNOSIS — I480 Paroxysmal atrial fibrillation: Secondary | ICD-10-CM

## 2020-04-02 DIAGNOSIS — I350 Nonrheumatic aortic (valve) stenosis: Secondary | ICD-10-CM | POA: Diagnosis not present

## 2020-04-02 DIAGNOSIS — I10 Essential (primary) hypertension: Secondary | ICD-10-CM

## 2020-04-02 NOTE — Addendum Note (Signed)
Addended by: Nigel Mormon on: 04/02/2020 09:59 AM   Modules accepted: Orders

## 2020-04-02 NOTE — Progress Notes (Signed)
Patient referred by Gaynelle Arabian, MD for shortness of breath  Subjective:   Danielle Beck, female    DOB: 1950/08/16, 69 y.o.   MRN: [id number]   Chief Complaint  Patient presents with  . Atrial Fibrillation  . Follow-up  . Results    echo   HPI  69 y.o. female with hypertension, diabetes mellitus, HFpEF, paroxysmal Afib/flutter, PSVT  Patient is here with her husband. She is doing well without overt shortness of breath. Leg edema has resolved. Blood pressure is fairly well controlled.   Recent echocardiogram results reviewed with the patient, details below.   Labs from 01/2020 reviewed. Na was at 121.   Current Outpatient Medications on File Prior to Visit  Medication Sig Dispense Refill  . amLODipine (NORVASC) 10 MG tablet Take 10 mg by mouth daily.     Marland Kitchen atorvastatin (LIPITOR) 10 MG tablet Take 10 mg by mouth daily.    . calcium-vitamin D (OSCAL WITH D) 250-125 MG-UNIT tablet Take 1 tablet by mouth 2 (two) times daily.     . dorzolamide (TRUSOPT) 2 % ophthalmic solution Place 1 drop into the left eye 2 (two) times daily.    Marland Kitchen gabapentin (NEURONTIN) 400 MG capsule Take 400 mg by mouth 3 (three) times daily.    Marland Kitchen LANTUS SOLOSTAR 100 UNIT/ML Solostar Pen Inject 30 Units into the skin daily.    Marland Kitchen latanoprost (XALATAN) 0.005 % ophthalmic solution Place 1 drop into both eyes at bedtime.     Marland Kitchen losartan (COZAAR) 100 MG tablet Take 1 tablet (100 mg total) by mouth daily. 90 tablet 3  . metFORMIN (GLUCOPHAGE) 1000 MG tablet Take 1 tablet by mouth in the morning and at bedtime.    . metoprolol tartrate (LOPRESSOR) 25 MG tablet Take 1 tablet (25 mg total) by mouth 2 (two) times daily. 180 tablet 3  . prednisoLONE acetate (PRED FORTE) 1 % ophthalmic suspension as needed.    . pyridOXINE (VITAMIN B-6) 100 MG tablet Take 100 mg by mouth daily.    . rivaroxaban (XARELTO) 20 MG TABS tablet Take 1 tablet (20 mg total) by mouth daily with supper. 90 tablet 2  . thiamine (VITAMIN  B-1) 100 MG tablet Take 100 mg by mouth daily.    . timolol (TIMOPTIC) 0.5 % ophthalmic solution Place 1 drop into the left eye 2 (two) times daily.    Marland Kitchen torsemide (DEMADEX) 20 MG tablet Take 1 tablet (20 mg total) by mouth as directed. 1 tab every day Additional 1/2 tab M,W,F 180 tablet 2  . vitamin B-12 (CYANOCOBALAMIN) 100 MCG tablet Take 100 mcg by mouth daily.     No current facility-administered medications on file prior to visit.    Cardiovascular studies:  Echocardiogram 03/30/2020: Left ventricle cavity is normal in size. Mild concentric hypertrophy of the left ventricle. Normal global wall motion. Normal LV systolic function with EF 55%. Doppler evidence of grade II (pseudonormal) diastolic dysfunction, elevated LAP. Calculated EF 55%. Left atrial cavity is moderately dilated. Trileaflet aortic valve. Moderate aortic stenosis. Vmax 2.4 m/sec, mean PG 15 mmHg, AVA 1.1 cm2. Trace aortic regurgitation. Mild mitral valve leaflet thickening. Moderate (Grade II) mitral regurgitation. Mild tricuspid regurgitation. Estimated pulmonary artery systolic pressure 23 mmHg. No significant change compared to previous study on 04/01/2019.   EKG 01/26/2020: Sinus rhythm 71 bpm Possible old anterseptal infarct Low voltage in precordial leads Poor R wave progression  Real time outpatient cardiac telemetry 09/23/2019: Monitoring period 360 hr 41 min  HR 56-170 bpm. Avg HR 83 bpm Rare ventricular ectopy (PVC/pair) Frequent PAC's and occasional atrial bigeminy in sinus rhythm.  Multiple runs of SVT at 150 bpm with associated T wave inversions, followed by coarse Afib/atrial flutter episodes SVT burden <1%, Afib burden <1% No symptoms associated with above arrhythmia. Patient activated events correlate with sinus rhythm. No VT/high grade AV block, sinus pause >3sec noted.  ABI 09/12/2019:  This exam reveals normal perfusion of the lower extremity (ABI 1.07 bilateral).  Triphasic (normal)  waveform noted at the level of the ankle.  Lexiscan Tetrofosmin Stress Test  03/24/2019: Resting EKG normal sinus rhythm, stress EKG nondiagnostic due to pharmacologic stress test using Lexiscan.  Status symptoms included dyspnea and dizziness. There is a prominent diaphragmatic attenuation artifact in the inferior wall. without ischemia or scar. Left ventricular ejection fraction is  58% with normal wall motion. Low risk study. No previous exam available for comparison.  EKG 03/21/2019:  Sinus rhythm 79 bpm.  Normal EKG.   CTA chest 03/05/2019: No evidence of pulmonary embolism. Mild vascular congestion and edema with small pleural effusions. Aortic Atherosclerosis.  Recent labs: 02/03/2020: Glucose 91, BUN/Cr 23/0.81. EGFR 75. Na/K 121/4.6.  H/H 10/31. MCV 91. Platelets 307  11/26/2019: Glucose 144, BUN/Cr 27/1.01. EGFR 54. Na/K 137/5.2. Rest of the CMP normal Chol 202, TG 120, HDL 79, LDL 103  07/10/2019: Glucose 216, BUN/Cr 26/0.86. EGFR 70. Na/K 136/4.6. BNP 71.  06/12/2019: Glucose 152, BUN/Cr 22/0.86. EGFR 70. Na/K 137/5.3.  06/04/2019: Glucose 134, BUN/Cr 44/1.24. EGFR 45. Na/K 135/4.5.  05/07/2019: Glucose 93, BUN/Cr 39/0.9. EGFR 62. Na/K 139/4.6. Rest of the CMP normal Chol 221, TG 111, HDL 72, LDL 130.   Review of Systems  Cardiovascular: Positive for dyspnea on exertion (Improved) and leg swelling. Negative for chest pain, palpitations and syncope.       Vitals:   04/02/20 0929  BP: (!) 141/57  Pulse: 64  Resp: 16  SpO2: 94%     Objective:   Physical Exam Vitals and nursing note reviewed.  Constitutional:      General: She is not in acute distress. Neck:     Vascular: No JVD.  Cardiovascular:     Rate and Rhythm: Normal rate and regular rhythm.     Heart sounds: Murmur heard.  Harsh midsystolic murmur is present with a grade of 3/6 at the upper right sternal border radiating to the neck.   Pulmonary:     Effort: Pulmonary effort is normal.      Breath sounds: Normal breath sounds. No wheezing or rales.  Musculoskeletal:     Right lower leg: Edema (1+) present.     Left lower leg: Edema (1+) present.          Assessment & Recommendations:   69 y.o. female with hypertension, diabetes mellitus, HFpEF, mod AS, mod MR,  paroxysmal Afib/flutter, PSVT  Hyponatremia: Na 121 in 01/2020. Will check BMP and Sr osmolality today. I suspect this is dilutional hyponatremia. Fortunately. She does not have any symptoms from it.   Mod AS, mod MR: Stable. Repeat echocardiogram in 1 year   Paroxysmal Afib/flutter + PSVT: Improved. CHA2DS2VASc score 4, annual stroke risk 5%. Continue Xarelto 20 mg daily. Xarelto is expensive. Patient will find out from [facility], if eliquis will be cheaper.  Stable normocytic anemia  HFpEF: Aggressive blood pressure control, low-salt diet, and diuretic therapy. Recommend torsemide 20 mg daily, and additional 10 mg M,W,F  Hypertension: Continue current antihypertensive therapy.  F/u in  6 months  Jayona Mccaig Esther Hardy, MD [facility] [place] Pager: [phone] Office: [phone] If no answer Cell [phone]

## 2020-04-04 LAB — BASIC METABOLIC PANEL
BUN/Creatinine Ratio: 29 — ABNORMAL HIGH (ref 12–28)
BUN: 36 mg/dL — ABNORMAL HIGH (ref 8–27)
CO2: 23 mmol/L (ref 20–29)
Calcium: 9.5 mg/dL (ref 8.7–10.3)
Chloride: 97 mmol/L (ref 96–106)
Creatinine, Ser: 1.25 mg/dL — ABNORMAL HIGH (ref 0.57–1.00)
GFR calc Af Amer: 51 mL/min/{1.73_m2} — ABNORMAL LOW (ref >59)
GFR calc non Af Amer: 44 mL/min/{1.73_m2} — ABNORMAL LOW (ref >59)
Glucose: 298 mg/dL — ABNORMAL HIGH (ref 65–99)
Potassium: 4.7 mmol/L (ref 3.5–5.2)
Sodium: 137 mmol/L (ref 134–144)

## 2020-04-04 LAB — OSMOLALITY: Osmolality Meas: 301 mOsmol/kg (ref 280–301)

## 2020-04-05 ENCOUNTER — Other Ambulatory Visit: Payer: Self-pay | Admitting: Cardiology

## 2020-04-05 DIAGNOSIS — I5032 Chronic diastolic (congestive) heart failure: Secondary | ICD-10-CM

## 2020-04-07 DIAGNOSIS — E113512 Type 2 diabetes mellitus with proliferative diabetic retinopathy with macular edema, left eye: Secondary | ICD-10-CM | POA: Diagnosis not present

## 2020-04-12 DIAGNOSIS — I5032 Chronic diastolic (congestive) heart failure: Secondary | ICD-10-CM | POA: Diagnosis not present

## 2020-04-13 LAB — BASIC METABOLIC PANEL
BUN/Creatinine Ratio: 36 — ABNORMAL HIGH (ref 12–28)
BUN: 32 mg/dL — ABNORMAL HIGH (ref 8–27)
CO2: 21 mmol/L (ref 20–29)
Calcium: 9.1 mg/dL (ref 8.7–10.3)
Chloride: 100 mmol/L (ref 96–106)
Creatinine, Ser: 0.9 mg/dL (ref 0.57–1.00)
GFR calc Af Amer: 76 mL/min/{1.73_m2} (ref >59)
GFR calc non Af Amer: 66 mL/min/{1.73_m2} (ref >59)
Glucose: 97 mg/dL (ref 65–99)
Potassium: 4.9 mmol/L (ref 3.5–5.2)
Sodium: 133 mmol/L — ABNORMAL LOW (ref 134–144)

## 2020-04-14 DIAGNOSIS — E113511 Type 2 diabetes mellitus with proliferative diabetic retinopathy with macular edema, right eye: Secondary | ICD-10-CM | POA: Diagnosis not present

## 2020-05-05 DIAGNOSIS — Z0100 Encounter for examination of eyes and vision without abnormal findings: Secondary | ICD-10-CM | POA: Diagnosis not present

## 2020-05-12 DIAGNOSIS — E113512 Type 2 diabetes mellitus with proliferative diabetic retinopathy with macular edema, left eye: Secondary | ICD-10-CM | POA: Diagnosis not present

## 2020-05-24 DIAGNOSIS — E113511 Type 2 diabetes mellitus with proliferative diabetic retinopathy with macular edema, right eye: Secondary | ICD-10-CM | POA: Diagnosis not present

## 2020-06-15 ENCOUNTER — Other Ambulatory Visit: Payer: Self-pay | Admitting: Family Medicine

## 2020-06-15 DIAGNOSIS — Z1231 Encounter for screening mammogram for malignant neoplasm of breast: Secondary | ICD-10-CM

## 2020-06-17 DIAGNOSIS — E1139 Type 2 diabetes mellitus with other diabetic ophthalmic complication: Secondary | ICD-10-CM | POA: Diagnosis not present

## 2020-06-17 DIAGNOSIS — I10 Essential (primary) hypertension: Secondary | ICD-10-CM | POA: Diagnosis not present

## 2020-06-17 DIAGNOSIS — E11311 Type 2 diabetes mellitus with unspecified diabetic retinopathy with macular edema: Secondary | ICD-10-CM | POA: Diagnosis not present

## 2020-06-17 DIAGNOSIS — I700 Atherosclerosis of aorta: Secondary | ICD-10-CM | POA: Diagnosis not present

## 2020-06-17 DIAGNOSIS — E7800 Pure hypercholesterolemia, unspecified: Secondary | ICD-10-CM | POA: Diagnosis not present

## 2020-06-17 DIAGNOSIS — Z794 Long term (current) use of insulin: Secondary | ICD-10-CM | POA: Diagnosis not present

## 2020-06-17 DIAGNOSIS — E1149 Type 2 diabetes mellitus with other diabetic neurological complication: Secondary | ICD-10-CM | POA: Diagnosis not present

## 2020-06-17 DIAGNOSIS — E871 Hypo-osmolality and hyponatremia: Secondary | ICD-10-CM | POA: Diagnosis not present

## 2020-06-17 DIAGNOSIS — I5032 Chronic diastolic (congestive) heart failure: Secondary | ICD-10-CM | POA: Diagnosis not present

## 2020-06-17 DIAGNOSIS — I480 Paroxysmal atrial fibrillation: Secondary | ICD-10-CM | POA: Diagnosis not present

## 2020-06-21 ENCOUNTER — Other Ambulatory Visit: Payer: Self-pay | Admitting: Cardiology

## 2020-06-21 DIAGNOSIS — I480 Paroxysmal atrial fibrillation: Secondary | ICD-10-CM

## 2020-06-21 DIAGNOSIS — Z1231 Encounter for screening mammogram for malignant neoplasm of breast: Secondary | ICD-10-CM | POA: Diagnosis not present

## 2020-06-21 DIAGNOSIS — R600 Localized edema: Secondary | ICD-10-CM

## 2020-06-21 DIAGNOSIS — I5032 Chronic diastolic (congestive) heart failure: Secondary | ICD-10-CM

## 2020-06-29 DIAGNOSIS — N1832 Chronic kidney disease, stage 3b: Secondary | ICD-10-CM | POA: Diagnosis not present

## 2020-06-29 DIAGNOSIS — R809 Proteinuria, unspecified: Secondary | ICD-10-CM | POA: Diagnosis not present

## 2020-06-29 DIAGNOSIS — N189 Chronic kidney disease, unspecified: Secondary | ICD-10-CM | POA: Diagnosis not present

## 2020-06-29 DIAGNOSIS — I480 Paroxysmal atrial fibrillation: Secondary | ICD-10-CM | POA: Diagnosis not present

## 2020-06-29 DIAGNOSIS — D631 Anemia in chronic kidney disease: Secondary | ICD-10-CM | POA: Diagnosis not present

## 2020-06-29 DIAGNOSIS — I509 Heart failure, unspecified: Secondary | ICD-10-CM | POA: Diagnosis not present

## 2020-06-29 DIAGNOSIS — E871 Hypo-osmolality and hyponatremia: Secondary | ICD-10-CM | POA: Diagnosis not present

## 2020-06-29 DIAGNOSIS — I129 Hypertensive chronic kidney disease with stage 1 through stage 4 chronic kidney disease, or unspecified chronic kidney disease: Secondary | ICD-10-CM | POA: Diagnosis not present

## 2020-06-30 DIAGNOSIS — E113512 Type 2 diabetes mellitus with proliferative diabetic retinopathy with macular edema, left eye: Secondary | ICD-10-CM | POA: Diagnosis not present

## 2020-07-02 ENCOUNTER — Other Ambulatory Visit: Payer: Self-pay | Admitting: Nephrology

## 2020-07-02 DIAGNOSIS — N1832 Chronic kidney disease, stage 3b: Secondary | ICD-10-CM

## 2020-07-14 DIAGNOSIS — E113511 Type 2 diabetes mellitus with proliferative diabetic retinopathy with macular edema, right eye: Secondary | ICD-10-CM | POA: Diagnosis not present

## 2020-07-20 ENCOUNTER — Ambulatory Visit
Admission: RE | Admit: 2020-07-20 | Discharge: 2020-07-20 | Disposition: A | Discharge status: Home / Self Care | Payer: Medicare HMO | Source: Ambulatory Visit | Attending: Nephrology | Admitting: Nephrology

## 2020-07-20 DIAGNOSIS — N1832 Chronic kidney disease, stage 3b: Secondary | ICD-10-CM

## 2020-07-20 DIAGNOSIS — N183 Chronic kidney disease, stage 3 unspecified: Secondary | ICD-10-CM | POA: Diagnosis not present

## 2020-08-26 DIAGNOSIS — E113512 Type 2 diabetes mellitus with proliferative diabetic retinopathy with macular edema, left eye: Secondary | ICD-10-CM | POA: Diagnosis not present

## 2020-09-09 DIAGNOSIS — E113511 Type 2 diabetes mellitus with proliferative diabetic retinopathy with macular edema, right eye: Secondary | ICD-10-CM | POA: Diagnosis not present

## 2020-09-16 DIAGNOSIS — N390 Urinary tract infection, site not specified: Secondary | ICD-10-CM | POA: Diagnosis not present

## 2020-09-16 DIAGNOSIS — N1832 Chronic kidney disease, stage 3b: Secondary | ICD-10-CM | POA: Diagnosis not present

## 2020-09-25 ENCOUNTER — Other Ambulatory Visit: Payer: Self-pay | Admitting: Cardiology

## 2020-09-29 ENCOUNTER — Ambulatory Visit: Payer: Medicare HMO | Admitting: Cardiology

## 2020-10-03 NOTE — Progress Notes (Signed)
Patient referred by Gaynelle Arabian, MD for shortness of breath  Subjective:   Danielle Beck, female    DOB: 07-12-1950, 70 y.o.   MRN: [id number]  Chief Complaint  Patient presents with  . PAF  . Moderate mitral regurgitation  . Hypertension  . Follow-up    6 month   HPI  70 y.o. female with hypertension, diabetes mellitus, HFpEF, paroxysmal Afib/flutter, PSVT  Patient is here with her husband. She is doing well without overt shortness of breath. Leg edema is well controlled.  Blood pressure is well controlled.   Patient recently underwent blood work through nephrologist, reviewed with patient.  Details below.  Potassium noted to be 5.3.  Patient and husband are traveling to [place] to visit their son later this week.  Current Outpatient Medications on File Prior to Visit  Medication Sig Dispense Refill  . amLODipine (NORVASC) 10 MG tablet Take 10 mg by mouth daily.     Marland Kitchen atorvastatin (LIPITOR) 10 MG tablet Take 10 mg by mouth daily.    . calcium-vitamin D (OSCAL WITH D) 250-125 MG-UNIT tablet Take 1 tablet by mouth 2 (two) times daily.     . dorzolamide (TRUSOPT) 2 % ophthalmic solution Place 1 drop into the left eye 2 (two) times daily.    Marland Kitchen gabapentin (NEURONTIN) 400 MG capsule Take 400 mg by mouth 3 (three) times daily.    Marland Kitchen LANTUS SOLOSTAR 100 UNIT/ML Solostar Pen Inject 30 Units into the skin daily.    Marland Kitchen latanoprost (XALATAN) 0.005 % ophthalmic solution Place 1 drop into both eyes at bedtime.     Marland Kitchen losartan (COZAAR) 100 MG tablet TAKE 1 TABLET (100 MG TOTAL) BY MOUTH DAILY. 90 tablet 3  . metFORMIN (GLUCOPHAGE) 1000 MG tablet Take 1 tablet by mouth in the morning and at bedtime.    . metoprolol tartrate (LOPRESSOR) 25 MG tablet TAKE 1 TABLET TWICE DAILY 180 tablet 3  . prednisoLONE acetate (PRED FORTE) 1 % ophthalmic suspension as needed.    . pyridOXINE (VITAMIN B-6) 100 MG tablet Take 100 mg by mouth daily.    . rivaroxaban (XARELTO) 20 MG TABS tablet Take 1  tablet (20 mg total) by mouth daily with supper. 90 tablet 2  . thiamine (VITAMIN B-1) 100 MG tablet Take 100 mg by mouth daily.    . timolol (TIMOPTIC) 0.5 % ophthalmic solution Place 1 drop into the left eye 2 (two) times daily.    Marland Kitchen torsemide (DEMADEX) 20 MG tablet TAKE 1 TABLET EVERY DAY AND 1 ADDITIONAL TABLET IF NEEDED FOR LEG SWELLING AS DIRECTED 180 tablet 2  . vitamin B-12 (CYANOCOBALAMIN) 100 MCG tablet Take 100 mcg by mouth daily.     No current facility-administered medications on file prior to visit.    Cardiovascular studies:  EKG 10/04/2020: Sinus rhythm 75 bpm  Old anteroseptal infarct Nonspecific T-abnormality  Echocardiogram 03/30/2020: Left ventricle cavity is normal in size. Mild concentric hypertrophy of the left ventricle. Normal global wall motion. Normal LV systolic function with EF 55%. Doppler evidence of grade II (pseudonormal) diastolic dysfunction, elevated LAP. Calculated EF 55%. Left atrial cavity is moderately dilated. Trileaflet aortic valve. Moderate aortic stenosis. Vmax 2.4 m/sec, mean PG 15 mmHg, AVA 1.1 cm2. Trace aortic regurgitation. Mild mitral valve leaflet thickening. Moderate (Grade II) mitral regurgitation. Mild tricuspid regurgitation. Estimated pulmonary artery systolic pressure 23 mmHg. No significant change compared to previous study on 04/01/2019.   EKG 01/26/2020: Sinus rhythm 71 bpm Possible old anterseptal  infarct Low voltage in precordial leads Poor R wave progression  Real time outpatient cardiac telemetry 09/23/2019: Monitoring period 360 hr 41 min HR 56-170 bpm. Avg HR 83 bpm Rare ventricular ectopy (PVC/pair) Frequent PAC's and occasional atrial bigeminy in sinus rhythm.  Multiple runs of SVT at 150 bpm with associated T wave inversions, followed by coarse Afib/atrial flutter episodes SVT burden <1%, Afib burden <1% No symptoms associated with above arrhythmia. Patient activated events correlate with sinus rhythm. No VT/high  grade AV block, sinus pause >3sec noted.  ABI 09/12/2019:  This exam reveals normal perfusion of the lower extremity (ABI 1.07 bilateral).  Triphasic (normal) waveform noted at the level of the ankle.  Lexiscan Tetrofosmin Stress Test  03/24/2019: Resting EKG normal sinus rhythm, stress EKG nondiagnostic due to pharmacologic stress test using Lexiscan.  Status symptoms included dyspnea and dizziness. There is a prominent diaphragmatic attenuation artifact in the inferior wall. without ischemia or scar. Left ventricular ejection fraction is  58% with normal wall motion. Low risk study. No previous exam available for comparison.  EKG 03/21/2019:  Sinus rhythm 79 bpm.  Normal EKG.   CTA chest 03/05/2019: No evidence of pulmonary embolism. Mild vascular congestion and edema with small pleural effusions. Aortic Atherosclerosis.  Recent labs: 09/16/2020: Glucose 146, BUN/Cr 38/1.25. EGFR 47. Na/K 136/5.3.   04/12/2020: Glucose 97, BUN/Cr 32/0.9. EGFR 66. Na/K 133/4.9.   02/03/2020: Glucose 91, BUN/Cr 23/0.81. EGFR 75. Na/K 121/4.6.  H/H 10/31. MCV 91. Platelets 307  11/26/2019: Glucose 144, BUN/Cr 27/1.01. EGFR 54. Na/K 137/5.2. Rest of the CMP normal Chol 202, TG 120, HDL 79, LDL 103  07/10/2019: Glucose 216, BUN/Cr 26/0.86. EGFR 70. Na/K 136/4.6. BNP 71.  06/12/2019: Glucose 152, BUN/Cr 22/0.86. EGFR 70. Na/K 137/5.3.  06/04/2019: Glucose 134, BUN/Cr 44/1.24. EGFR 45. Na/K 135/4.5.  05/07/2019: Glucose 93, BUN/Cr 39/0.9. EGFR 62. Na/K 139/4.6. Rest of the CMP normal Chol 221, TG 111, HDL 72, LDL 130.   Review of Systems  Cardiovascular: Positive for dyspnea on exertion (Improved) and leg swelling. Negative for chest pain, palpitations and syncope.       Vitals:   10/04/20 1006  BP: 109/70  Pulse: 72  Resp: 16  Temp: 98.4 F (36.9 C)  SpO2: 95%     Objective:   Physical Exam Vitals and nursing note reviewed.  Constitutional:      General: She is not in acute  distress. Neck:     Vascular: No JVD.  Cardiovascular:     Rate and Rhythm: Normal rate and regular rhythm.     Heart sounds: Murmur heard.   Harsh midsystolic murmur is present with a grade of 3/6 at the upper right sternal border radiating to the neck.   Pulmonary:     Effort: Pulmonary effort is normal.     Breath sounds: Normal breath sounds. No wheezing or rales.  Musculoskeletal:     Right lower leg: Edema (1+) present.     Left lower leg: Edema (1+) present.          Assessment & Recommendations:   70 y.o. female with hypertension, diabetes mellitus, HFpEF, mod AS, mod MR,  paroxysmal Afib/flutter, PSVT  Hyponatremia: Na 121 in 01/2020, improved to 136 in 08/2020.  I suspect this was dilutional hyponatremia, improved with diuresis.  Mod AS, mod MR: Stable. Repeat echocardiogram in 03/2021.  Paroxysmal Afib/flutter + PSVT: Improved. CHA2DS2VASc score 4, annual stroke risk 5%. Continue Xarelto 20 mg daily. Xarelto is expensive. Patient will find out from  insurance company, if eliquis will be cheaper.  Stable normocytic anemia  HFpEF: Aggressive blood pressure control, low-salt diet, and diuretic therapy. Currently on torsemide 1.5 tabs of 20 mg daily Recent BMP showed potassium of 5.3.  I will recheck to confirm stability.  Hypertension: Continue current antihypertensive therapy.  F/u in  6 months  Danielle Beck Esther Hardy, MD [facility] [place] Pager: [phone] Office: [phone] If no answer Cell [phone]

## 2020-10-04 ENCOUNTER — Encounter: Payer: Self-pay | Admitting: Cardiology

## 2020-10-04 ENCOUNTER — Other Ambulatory Visit: Payer: Self-pay

## 2020-10-04 ENCOUNTER — Ambulatory Visit: Payer: Medicare HMO | Admitting: Cardiology

## 2020-10-04 VITALS — BP 109/70 | HR 72 | Temp 98.4°F | Resp 16 | Ht 63.0 in | Wt 187.0 lb

## 2020-10-04 DIAGNOSIS — I340 Nonrheumatic mitral (valve) insufficiency: Secondary | ICD-10-CM

## 2020-10-04 DIAGNOSIS — I10 Essential (primary) hypertension: Secondary | ICD-10-CM

## 2020-10-04 DIAGNOSIS — I480 Paroxysmal atrial fibrillation: Secondary | ICD-10-CM | POA: Diagnosis not present

## 2020-10-04 DIAGNOSIS — I5032 Chronic diastolic (congestive) heart failure: Secondary | ICD-10-CM | POA: Diagnosis not present

## 2020-10-04 DIAGNOSIS — I350 Nonrheumatic aortic (valve) stenosis: Secondary | ICD-10-CM

## 2020-10-05 LAB — BASIC METABOLIC PANEL
BUN/Creatinine Ratio: 32 — ABNORMAL HIGH (ref 12–28)
BUN: 43 mg/dL — ABNORMAL HIGH (ref 8–27)
CO2: 21 mmol/L (ref 20–29)
Calcium: 9.5 mg/dL (ref 8.7–10.3)
Chloride: 99 mmol/L (ref 96–106)
Creatinine, Ser: 1.35 mg/dL — ABNORMAL HIGH (ref 0.57–1.00)
Glucose: 183 mg/dL — ABNORMAL HIGH (ref 65–99)
Potassium: 4.5 mmol/L (ref 3.5–5.2)
Sodium: 134 mmol/L (ref 134–144)
eGFR: 43 mL/min/{1.73_m2} — ABNORMAL LOW (ref >59)

## 2020-10-28 DIAGNOSIS — E113511 Type 2 diabetes mellitus with proliferative diabetic retinopathy with macular edema, right eye: Secondary | ICD-10-CM | POA: Diagnosis not present

## 2020-11-04 DIAGNOSIS — H524 Presbyopia: Secondary | ICD-10-CM | POA: Diagnosis not present

## 2020-11-04 DIAGNOSIS — H52222 Regular astigmatism, left eye: Secondary | ICD-10-CM | POA: Diagnosis not present

## 2020-11-04 DIAGNOSIS — Z794 Long term (current) use of insulin: Secondary | ICD-10-CM | POA: Diagnosis not present

## 2020-11-04 DIAGNOSIS — E113313 Type 2 diabetes mellitus with moderate nonproliferative diabetic retinopathy with macular edema, bilateral: Secondary | ICD-10-CM | POA: Diagnosis not present

## 2020-11-04 DIAGNOSIS — H40053 Ocular hypertension, bilateral: Secondary | ICD-10-CM | POA: Diagnosis not present

## 2020-11-04 DIAGNOSIS — H5213 Myopia, bilateral: Secondary | ICD-10-CM | POA: Diagnosis not present

## 2020-11-04 DIAGNOSIS — Z961 Presence of intraocular lens: Secondary | ICD-10-CM | POA: Diagnosis not present

## 2020-11-18 DIAGNOSIS — E113512 Type 2 diabetes mellitus with proliferative diabetic retinopathy with macular edema, left eye: Secondary | ICD-10-CM | POA: Diagnosis not present

## 2020-12-01 DIAGNOSIS — E11311 Type 2 diabetes mellitus with unspecified diabetic retinopathy with macular edema: Secondary | ICD-10-CM | POA: Diagnosis not present

## 2020-12-01 DIAGNOSIS — E1149 Type 2 diabetes mellitus with other diabetic neurological complication: Secondary | ICD-10-CM | POA: Diagnosis not present

## 2020-12-01 DIAGNOSIS — E871 Hypo-osmolality and hyponatremia: Secondary | ICD-10-CM | POA: Diagnosis not present

## 2020-12-01 DIAGNOSIS — E1139 Type 2 diabetes mellitus with other diabetic ophthalmic complication: Secondary | ICD-10-CM | POA: Diagnosis not present

## 2020-12-01 DIAGNOSIS — Z7984 Long term (current) use of oral hypoglycemic drugs: Secondary | ICD-10-CM | POA: Diagnosis not present

## 2020-12-01 DIAGNOSIS — Z0000 Encounter for general adult medical examination without abnormal findings: Secondary | ICD-10-CM | POA: Diagnosis not present

## 2020-12-01 DIAGNOSIS — E7800 Pure hypercholesterolemia, unspecified: Secondary | ICD-10-CM | POA: Diagnosis not present

## 2020-12-01 DIAGNOSIS — I5032 Chronic diastolic (congestive) heart failure: Secondary | ICD-10-CM | POA: Diagnosis not present

## 2020-12-01 DIAGNOSIS — I10 Essential (primary) hypertension: Secondary | ICD-10-CM | POA: Diagnosis not present

## 2020-12-01 DIAGNOSIS — Z1389 Encounter for screening for other disorder: Secondary | ICD-10-CM | POA: Diagnosis not present

## 2020-12-06 DIAGNOSIS — N1832 Chronic kidney disease, stage 3b: Secondary | ICD-10-CM | POA: Diagnosis not present

## 2020-12-06 DIAGNOSIS — N189 Chronic kidney disease, unspecified: Secondary | ICD-10-CM | POA: Diagnosis not present

## 2020-12-17 DIAGNOSIS — D631 Anemia in chronic kidney disease: Secondary | ICD-10-CM | POA: Diagnosis not present

## 2020-12-17 DIAGNOSIS — E559 Vitamin D deficiency, unspecified: Secondary | ICD-10-CM | POA: Diagnosis not present

## 2020-12-17 DIAGNOSIS — N1832 Chronic kidney disease, stage 3b: Secondary | ICD-10-CM | POA: Diagnosis not present

## 2020-12-17 DIAGNOSIS — I509 Heart failure, unspecified: Secondary | ICD-10-CM | POA: Diagnosis not present

## 2020-12-17 DIAGNOSIS — E1122 Type 2 diabetes mellitus with diabetic chronic kidney disease: Secondary | ICD-10-CM | POA: Diagnosis not present

## 2020-12-17 DIAGNOSIS — I129 Hypertensive chronic kidney disease with stage 1 through stage 4 chronic kidney disease, or unspecified chronic kidney disease: Secondary | ICD-10-CM | POA: Diagnosis not present

## 2020-12-17 DIAGNOSIS — R809 Proteinuria, unspecified: Secondary | ICD-10-CM | POA: Diagnosis not present

## 2020-12-17 DIAGNOSIS — E871 Hypo-osmolality and hyponatremia: Secondary | ICD-10-CM | POA: Diagnosis not present

## 2020-12-28 DIAGNOSIS — E113511 Type 2 diabetes mellitus with proliferative diabetic retinopathy with macular edema, right eye: Secondary | ICD-10-CM | POA: Diagnosis not present

## 2021-01-20 DIAGNOSIS — E113512 Type 2 diabetes mellitus with proliferative diabetic retinopathy with macular edema, left eye: Secondary | ICD-10-CM | POA: Diagnosis not present

## 2021-03-01 DIAGNOSIS — E113511 Type 2 diabetes mellitus with proliferative diabetic retinopathy with macular edema, right eye: Secondary | ICD-10-CM | POA: Diagnosis not present

## 2021-03-06 ENCOUNTER — Other Ambulatory Visit: Payer: Self-pay | Admitting: Cardiology

## 2021-03-06 DIAGNOSIS — I5032 Chronic diastolic (congestive) heart failure: Secondary | ICD-10-CM

## 2021-03-06 DIAGNOSIS — R600 Localized edema: Secondary | ICD-10-CM

## 2021-03-12 DIAGNOSIS — Z23 Encounter for immunization: Secondary | ICD-10-CM | POA: Diagnosis not present

## 2021-03-28 DIAGNOSIS — E113512 Type 2 diabetes mellitus with proliferative diabetic retinopathy with macular edema, left eye: Secondary | ICD-10-CM | POA: Diagnosis not present

## 2021-03-31 ENCOUNTER — Ambulatory Visit: Payer: Medicare HMO

## 2021-03-31 ENCOUNTER — Other Ambulatory Visit: Payer: Self-pay

## 2021-03-31 DIAGNOSIS — I350 Nonrheumatic aortic (valve) stenosis: Secondary | ICD-10-CM

## 2021-03-31 DIAGNOSIS — I340 Nonrheumatic mitral (valve) insufficiency: Secondary | ICD-10-CM

## 2021-04-06 ENCOUNTER — Encounter: Payer: Self-pay | Admitting: Cardiology

## 2021-04-06 ENCOUNTER — Ambulatory Visit: Payer: Medicare HMO | Admitting: Cardiology

## 2021-04-06 ENCOUNTER — Other Ambulatory Visit: Payer: Self-pay

## 2021-04-06 VITALS — BP 130/67 | HR 79 | Temp 97.8°F | Resp 16 | Ht 63.0 in | Wt 184.0 lb

## 2021-04-06 DIAGNOSIS — I350 Nonrheumatic aortic (valve) stenosis: Secondary | ICD-10-CM

## 2021-04-06 DIAGNOSIS — I5032 Chronic diastolic (congestive) heart failure: Secondary | ICD-10-CM | POA: Diagnosis not present

## 2021-04-06 NOTE — Progress Notes (Signed)
Patient referred by Gaynelle Arabian, MD for shortness of breath  Subjective:   Danielle Beck, female    DOB: 1951-02-06, 70 y.o.   MRN: [id number]  Chief Complaint  Patient presents with   Congestive Heart Failure   Atrial Fibrillation   Follow-up   HPI  70 y.o. female with hypertension, diabetes mellitus, HFpEF, paroxysmal Afib/flutter, PSVT  Patient is here with her husband. She is doing well without overt shortness of breath. Leg edema is well controlled with torsemide 1 in am, 1/2 in pm. She has regular follow up with nephrology for CKD, upcoming labs and visit next month. She is currently tolerating Xarelto 20 mg without any bleeding issues.  Current Outpatient Medications on File Prior to Visit  Medication Sig Dispense Refill   amLODipine (NORVASC) 10 MG tablet Take 10 mg by mouth daily.      atorvastatin (LIPITOR) 10 MG tablet Take 10 mg by mouth daily.     calcium-vitamin D (OSCAL WITH D) 250-125 MG-UNIT tablet Take 1 tablet by mouth 2 (two) times daily.      dapagliflozin propanediol (FARXIGA) 10 MG TABS tablet Take 10 mg by mouth daily.     dorzolamide (TRUSOPT) 2 % ophthalmic solution Place 1 drop into the left eye 2 (two) times daily.     gabapentin (NEURONTIN) 400 MG capsule Take 400 mg by mouth 3 (three) times daily.     LANTUS SOLOSTAR 100 UNIT/ML Solostar Pen Inject 30 Units into the skin daily.     latanoprost (XALATAN) 0.005 % ophthalmic solution Place 1 drop into both eyes at bedtime.      losartan (COZAAR) 100 MG tablet TAKE 1 TABLET (100 MG TOTAL) BY MOUTH DAILY. 90 tablet 3   metFORMIN (GLUCOPHAGE) 1000 MG tablet Take 1 tablet by mouth in the morning and at bedtime.     metoprolol tartrate (LOPRESSOR) 25 MG tablet TAKE 1 TABLET TWICE DAILY 180 tablet 3   prednisoLONE acetate (PRED FORTE) 1 % ophthalmic suspension as needed.     pyridOXINE (VITAMIN B-6) 100 MG tablet Take 100 mg by mouth daily.     rivaroxaban (XARELTO) 20 MG TABS tablet Take 1 tablet  (20 mg total) by mouth daily with supper. 90 tablet 2   thiamine (VITAMIN B-1) 100 MG tablet Take 100 mg by mouth daily.     timolol (TIMOPTIC) 0.5 % ophthalmic solution Place 1 drop into the left eye 2 (two) times daily.     torsemide (DEMADEX) 20 MG tablet TAKE 1 TABLET EVERY DAY AND 1 ADDITIONAL TABLET IF NEEDED FOR LEG SWELLING AS DIRECTED 180 tablet 2   vitamin B-12 (CYANOCOBALAMIN) 100 MCG tablet Take 100 mcg by mouth daily.     No current facility-administered medications on file prior to visit.    Cardiovascular studies:  EKG 04/06/2021; Sinus rhythm 77 bpm  Left ventricular hypertrophy Left anterior fascicular block Possible old anteroseptal infarct  Echocardiogram 03/31/2021:  Normal LV systolic function with visual EF 60-65%. Left ventricle cavity  is normal in size. Mild left ventricular hypertrophy. Doppler evidence of  grade II diastolic dysfunction, elevated LAP. Normal global wall motion.  Left atrial cavity is moderately dilated.  MIld to moderate aortic stenosis. Vmax 2.4 m/sec, mean PG 18 mmHg, AVA 1.3  cm2, DI 0.4.  Moderate (Grade II) mitral regurgitation.  Mild tricuspid regurgitation. No evidence of pulmonary hypertension.  IVC is normal with a respiratory response of <50%.  Compared to study 03/30/2020: no significant change.  Real time outpatient cardiac telemetry 09/23/2019: Monitoring period 360 hr 41 min HR 56-170 bpm. Avg HR 83 bpm Rare ventricular ectopy (PVC/pair) Frequent PAC's and occasional atrial bigeminy in sinus rhythm.  Multiple runs of SVT at 150 bpm with associated T wave inversions, followed by coarse Afib/atrial flutter episodes SVT burden <1%, Afib burden <1% No symptoms associated with above arrhythmia. Patient activated events correlate with sinus rhythm. No VT/high grade AV block, sinus pause >3sec noted.  ABI 09/12/2019:  This exam reveals normal perfusion of the lower extremity (ABI 1.07 bilateral).   Triphasic (normal) waveform  noted at the level of the ankle.  Lexiscan Tetrofosmin Stress Test  03/24/2019: Resting EKG normal sinus rhythm, stress EKG nondiagnostic due to pharmacologic stress test using Lexiscan.  Status symptoms included dyspnea and dizziness. There is a prominent diaphragmatic attenuation artifact in the inferior wall. without ischemia or scar. Left ventricular ejection fraction is  58% with normal wall motion. Low risk study. No previous exam available for comparison.  CTA chest 03/05/2019: No evidence of pulmonary embolism. Mild vascular congestion and edema with small pleural effusions. Aortic Atherosclerosis.   Recent labs: 12/01/2020: Glucose 143 HbA1C 6.6% Chol 182, TG 153, HDL 61, LDL 95  10/04/2020: Glucose 183, BUN/Cr 43/1.35. EGFR 43. Na/K 134/4.5.   09/16/2020: Glucose 146, BUN/Cr 38/1.25. EGFR 47. Na/K 136/5.3.   04/12/2020: Glucose 97, BUN/Cr 32/0.9. EGFR 66. Na/K 133/4.9.   02/03/2020: Glucose 91, BUN/Cr 23/0.81. EGFR 75. Na/K 121/4.6.  H/H 10/31. MCV 91. Platelets 307  11/26/2019: Glucose 144, BUN/Cr 27/1.01. EGFR 54. Na/K 137/5.2. Rest of the CMP normal Chol 202, TG 120, HDL 79, LDL 103  07/10/2019: Glucose 216, BUN/Cr 26/0.86. EGFR 70. Na/K 136/4.6. BNP 71.  06/12/2019: Glucose 152, BUN/Cr 22/0.86. EGFR 70. Na/K 137/5.3.  06/04/2019: Glucose 134, BUN/Cr 44/1.24. EGFR 45. Na/K 135/4.5.  05/07/2019: Glucose 93, BUN/Cr 39/0.9. EGFR 62. Na/K 139/4.6. Rest of the CMP normal Chol 221, TG 111, HDL 72, LDL 130.   Review of Systems  Cardiovascular:  Positive for dyspnea on exertion (Improved) and leg swelling. Negative for chest pain, palpitations and syncope.      Vitals:   04/06/21 1016  BP: 130/67  Pulse: 79  Resp: 16  Temp: 97.8 F (36.6 C)  SpO2: 98%     Objective:   Physical Exam Vitals and nursing note reviewed.  Constitutional:      General: She is not in acute distress. Neck:     Vascular: No JVD.  Cardiovascular:     Rate and Rhythm: Normal rate  and regular rhythm.     Heart sounds: Murmur heard.  Harsh midsystolic murmur is present with a grade of 3/6 at the upper right sternal border radiating to the neck.  Pulmonary:     Effort: Pulmonary effort is normal.     Breath sounds: Normal breath sounds. No wheezing or rales.  Musculoskeletal:     Right lower leg: Edema (1+) present.     Left lower leg: Edema (1+) present.         Assessment & Recommendations:   70 y.o. female with hypertension, diabetes mellitus, HFpEF, mod AS, mod MR,  paroxysmal Afib/flutter, PSVT  Mod AS, mod MR: Stable. Repeat echocardiogram in 03/2022.  Paroxysmal Afib/flutter + PSVT: Improved. CHA2DS2VASc score 4, annual stroke risk 5%. Continue Xarelto 20 mg daily. If GFR remains <50, recommend reducing Xarelto dose to 15 mg daily. Stable normocytic anemia  HFpEF: Aggressive blood pressure control, low-salt diet, and diuretic therapy. Currently on  torsemide 1.5 tabs of 20 mg daily  Hypertension: Continue current antihypertensive therapy.  F/u in  6 months  Ikeya Brockel Esther Hardy, MD [facility] [place] Pager: [phone] Office: [phone] If no answer Cell [phone]

## 2021-04-07 ENCOUNTER — Encounter: Payer: Self-pay | Admitting: Cardiology

## 2021-05-03 DIAGNOSIS — E113511 Type 2 diabetes mellitus with proliferative diabetic retinopathy with macular edema, right eye: Secondary | ICD-10-CM | POA: Diagnosis not present

## 2021-05-12 ENCOUNTER — Other Ambulatory Visit: Payer: Self-pay

## 2021-05-12 DIAGNOSIS — I480 Paroxysmal atrial fibrillation: Secondary | ICD-10-CM

## 2021-05-12 MED ORDER — RIVAROXABAN 20 MG PO TABS: 20.0000 mg | ORAL_TABLET | Freq: Every day | ORAL | 3 refills | Status: DC

## 2021-06-01 DIAGNOSIS — N1832 Chronic kidney disease, stage 3b: Secondary | ICD-10-CM | POA: Diagnosis not present

## 2021-06-01 DIAGNOSIS — N189 Chronic kidney disease, unspecified: Secondary | ICD-10-CM | POA: Diagnosis not present

## 2021-06-01 DIAGNOSIS — N390 Urinary tract infection, site not specified: Secondary | ICD-10-CM | POA: Diagnosis not present

## 2021-06-02 ENCOUNTER — Encounter: Payer: Self-pay | Admitting: Cardiology

## 2021-06-02 DIAGNOSIS — E113512 Type 2 diabetes mellitus with proliferative diabetic retinopathy with macular edema, left eye: Secondary | ICD-10-CM | POA: Diagnosis not present

## 2021-06-03 ENCOUNTER — Other Ambulatory Visit: Payer: Self-pay | Admitting: Cardiology

## 2021-06-03 DIAGNOSIS — I480 Paroxysmal atrial fibrillation: Secondary | ICD-10-CM

## 2021-06-03 MED ORDER — RIVAROXABAN 15 MG PO TABS: 15.0000 mg | ORAL_TABLET | Freq: Every day | ORAL | 3 refills | Status: DC

## 2021-06-03 NOTE — Telephone Encounter (Signed)
From patient.

## 2021-06-04 ENCOUNTER — Encounter: Payer: Self-pay | Admitting: Cardiology

## 2021-06-06 ENCOUNTER — Other Ambulatory Visit: Payer: Self-pay

## 2021-06-06 DIAGNOSIS — I480 Paroxysmal atrial fibrillation: Secondary | ICD-10-CM

## 2021-06-06 MED ORDER — RIVAROXABAN 15 MG PO TABS: 15.0000 mg | ORAL_TABLET | Freq: Every day | ORAL | 3 refills | Status: DC

## 2021-06-06 NOTE — Telephone Encounter (Signed)
From patient.

## 2021-06-14 DIAGNOSIS — R809 Proteinuria, unspecified: Secondary | ICD-10-CM | POA: Diagnosis not present

## 2021-06-14 DIAGNOSIS — D72829 Elevated white blood cell count, unspecified: Secondary | ICD-10-CM | POA: Diagnosis not present

## 2021-06-14 DIAGNOSIS — E1122 Type 2 diabetes mellitus with diabetic chronic kidney disease: Secondary | ICD-10-CM | POA: Diagnosis not present

## 2021-06-14 DIAGNOSIS — E871 Hypo-osmolality and hyponatremia: Secondary | ICD-10-CM | POA: Diagnosis not present

## 2021-06-14 DIAGNOSIS — D631 Anemia in chronic kidney disease: Secondary | ICD-10-CM | POA: Diagnosis not present

## 2021-06-14 DIAGNOSIS — I129 Hypertensive chronic kidney disease with stage 1 through stage 4 chronic kidney disease, or unspecified chronic kidney disease: Secondary | ICD-10-CM | POA: Diagnosis not present

## 2021-06-14 DIAGNOSIS — E872 Acidosis, unspecified: Secondary | ICD-10-CM | POA: Diagnosis not present

## 2021-06-14 DIAGNOSIS — I509 Heart failure, unspecified: Secondary | ICD-10-CM | POA: Diagnosis not present

## 2021-06-14 DIAGNOSIS — N1832 Chronic kidney disease, stage 3b: Secondary | ICD-10-CM | POA: Diagnosis not present

## 2021-06-20 DIAGNOSIS — D72829 Elevated white blood cell count, unspecified: Secondary | ICD-10-CM | POA: Diagnosis not present

## 2021-06-20 DIAGNOSIS — Z7984 Long term (current) use of oral hypoglycemic drugs: Secondary | ICD-10-CM | POA: Diagnosis not present

## 2021-06-20 DIAGNOSIS — N1832 Chronic kidney disease, stage 3b: Secondary | ICD-10-CM | POA: Diagnosis not present

## 2021-06-20 DIAGNOSIS — I480 Paroxysmal atrial fibrillation: Secondary | ICD-10-CM | POA: Diagnosis not present

## 2021-06-20 DIAGNOSIS — I10 Essential (primary) hypertension: Secondary | ICD-10-CM | POA: Diagnosis not present

## 2021-06-20 DIAGNOSIS — E1142 Type 2 diabetes mellitus with diabetic polyneuropathy: Secondary | ICD-10-CM | POA: Diagnosis not present

## 2021-06-20 DIAGNOSIS — E1165 Type 2 diabetes mellitus with hyperglycemia: Secondary | ICD-10-CM | POA: Diagnosis not present

## 2021-06-20 DIAGNOSIS — I5032 Chronic diastolic (congestive) heart failure: Secondary | ICD-10-CM | POA: Diagnosis not present

## 2021-06-20 DIAGNOSIS — E1139 Type 2 diabetes mellitus with other diabetic ophthalmic complication: Secondary | ICD-10-CM | POA: Diagnosis not present

## 2021-06-20 DIAGNOSIS — D638 Anemia in other chronic diseases classified elsewhere: Secondary | ICD-10-CM | POA: Diagnosis not present

## 2021-06-20 DIAGNOSIS — E7800 Pure hypercholesterolemia, unspecified: Secondary | ICD-10-CM | POA: Diagnosis not present

## 2021-06-20 DIAGNOSIS — D6869 Other thrombophilia: Secondary | ICD-10-CM | POA: Diagnosis not present

## 2021-06-20 DIAGNOSIS — I700 Atherosclerosis of aorta: Secondary | ICD-10-CM | POA: Diagnosis not present

## 2021-06-28 DIAGNOSIS — M545 Low back pain, unspecified: Secondary | ICD-10-CM | POA: Diagnosis not present

## 2021-06-28 DIAGNOSIS — M5416 Radiculopathy, lumbar region: Secondary | ICD-10-CM | POA: Diagnosis not present

## 2021-06-30 ENCOUNTER — Other Ambulatory Visit: Payer: Self-pay | Admitting: Orthopedic Surgery

## 2021-06-30 DIAGNOSIS — E113511 Type 2 diabetes mellitus with proliferative diabetic retinopathy with macular edema, right eye: Secondary | ICD-10-CM | POA: Diagnosis not present

## 2021-06-30 DIAGNOSIS — M545 Low back pain, unspecified: Secondary | ICD-10-CM

## 2021-07-23 ENCOUNTER — Ambulatory Visit
Admission: RE | Admit: 2021-07-23 | Discharge: 2021-07-23 | Disposition: A | Discharge status: Home / Self Care | Payer: Medicare HMO | Source: Ambulatory Visit | Attending: Orthopedic Surgery | Admitting: Orthopedic Surgery

## 2021-07-23 DIAGNOSIS — M47816 Spondylosis without myelopathy or radiculopathy, lumbar region: Secondary | ICD-10-CM | POA: Diagnosis not present

## 2021-07-23 DIAGNOSIS — M545 Low back pain, unspecified: Secondary | ICD-10-CM | POA: Diagnosis not present

## 2021-07-23 DIAGNOSIS — M48061 Spinal stenosis, lumbar region without neurogenic claudication: Secondary | ICD-10-CM | POA: Diagnosis not present

## 2021-07-28 ENCOUNTER — Other Ambulatory Visit: Payer: Self-pay | Admitting: Cardiology

## 2021-07-28 DIAGNOSIS — I480 Paroxysmal atrial fibrillation: Secondary | ICD-10-CM

## 2021-08-01 DIAGNOSIS — M5416 Radiculopathy, lumbar region: Secondary | ICD-10-CM | POA: Diagnosis not present

## 2021-08-04 DIAGNOSIS — E113512 Type 2 diabetes mellitus with proliferative diabetic retinopathy with macular edema, left eye: Secondary | ICD-10-CM | POA: Diagnosis not present

## 2021-08-05 DIAGNOSIS — M48062 Spinal stenosis, lumbar region with neurogenic claudication: Secondary | ICD-10-CM | POA: Diagnosis not present

## 2021-08-05 NOTE — Therapy (Signed)
OUTPATIENT PHYSICAL THERAPY EVALUATION   Patient Name: Danielle Beck MRN: [id number] DOB:25-Aug-1950, 71 y.o., female Today's Date: 08/06/2021   PT End of Session - 08/06/21 0833     Visit Number 1    Number of Visits 8    Date for PT Re-Evaluation 10/01/21    Authorization Type [employer] MCR    PT Start Time 0840    PT Stop Time 0925    PT Time Calculation (min) 45 min    Activity Tolerance Patient tolerated treatment well    Behavior During Therapy [facility] for tasks assessed/performed             Past Medical History:  Diagnosis Date   CHF (congestive heart failure) ([place])    Diabetes mellitus without complication ([place])    Hyperlipidemia    Hypertension    Past Surgical History:  Procedure Laterality Date   CATARACT EXTRACTION     TUBAL LIGATION  1985   Patient Active Problem List   Diagnosis Date Noted   Moderate mitral regurgitation 04/02/2020   Moderate aortic stenosis 04/02/2020   PAF (paroxysmal atrial fibrillation) ([place]) 10/15/2019   Palpitations 10/15/2019   Leg edema 07/17/2019   Essential hypertension 06/27/2019   Chronic heart failure with preserved ejection fraction ([place]) 06/27/2019   Shortness of breath 03/21/2019   Hyponatremia 03/21/2019   Anemia 03/21/2019   Screening cholesterol level 03/21/2019    PCP: Gaynelle Arabian, MD  REFERRING PROVIDER: Phylliss Bob, MD  REFERRING DIAG: Low back pain  THERAPY DIAG:  Chronic bilateral low back pain, unspecified whether sciatica present  Muscle weakness (generalized)  ONSET DATE: "over a year or so"  SUBJECTIVE:         SUBJECTIVE STATEMENT: Patient accompanied by husband.  Patient reports pain and numbness in her right leg. She sometimes has back pain. Patient states pain mainly occurs with walking and standing, she is able to walk 1/2 block and then has to stop due to pain. She has to take a short break and then can restart walking. Patient is typically active during the day and goes up/down  stairs at home multiple times per day.  PERTINENT HISTORY:  CHF, DM  PAIN:  Are you having pain? Yes NPRS scale: 0/10 (at worst 8/10) Pain location: Back Pain orientation: Lower  PAIN TYPE: Chronic Pain description: Intermittent,  Aggravating factors: Walking extra Relieving factors: Rubbing the leg  PRECAUTIONS: None  WEIGHT BEARING RESTRICTIONS No  FALLS:  Has patient fallen in last 6 months? No  LIVING ENVIRONMENT: Lives with: lives with their spouse  OCCUPATION: N/A  PLOF: Independent  PATIENT GOALS: Pain relief and improve walking ability   OBJECTIVE:  DIAGNOSTIC FINDINGS:  Lumbar MRI 07/23/2021: At L4-L5, there is a disc bulge. Superimposed broad-based disc extrusion spanning the central and bilateral subarticular zones (at site of posterior annular fissure). The disc extrusion has migrated caudally to the mid L5 level within the left center/subarticular zone. Advanced facet arthrosis with ligamentum flavum hypertrophy. 5 mm ventrally projecting synovial facet cyst on the right. Resultant severe central canal and bilateral subarticular stenosis with near complete effacement of the thecal sac. Mild relative bilateral neural foraminal narrowing. At L3-L4, there is a disc bulge. Superimposed broad-based shallow central disc protrusion spanning the central, left subarticular and left foraminal zones (at site of posterior annular fissure). This contributes to moderate bilateral subarticular stenosis with crowding of the bilateral descending L4 nerve roots. Mild relative narrowing of the central canal and left neural foramen. At L2-L3,  there is a small left subarticular/foraminal disc protrusion which contributes to mild left subarticular narrowing, and may contact the descending left L3 nerve root. It also contributes to mild relative left inferior neural foraminal narrowing. Chronic T11 anterior wedge vertebral compression fracture (70% height loss anteriorly). At T10-T11,  there is mild spinal canal stenosis due to 2-3 mm T11 superior endplate bony retropulsion, disc bulge and facet arthrosis/ligamentum flavum hypertrophy. Bilateral neural foraminal narrowing (moderate right, mild left) also present at this level.  PATIENT SURVEYS:  FOTO 51% functional status  SCREENING FOR RED FLAGS: Negative  COGNITION: Overall cognitive status: Within functional limits for tasks assessed     SENSATION: Light touch: grossly WFL, patient reports sensation deficits due to diabetic neuropathy  MUSCLE LENGTH: Hamstring length limited on right due to right hip/low back pain  POSTURE:  Slight increase lumbar lordosis, rounded shoulders,   PALPATION: Tenderness with palpation to glute med and max on right  LUMBAR AROM  A/PROM AROM  08/06/2021  Flexion WFL  Extension 50%  Right lateral flexion 75%  Left lateral flexion 75%  Right rotation 75%  Left rotation 75%   Patient reports right leg pain with lumbar extension  LE PROM:   Hip PROM grossly WFL and non-painful  LE MMT:  MMT Right 08/06/2021 Left 08/06/2021  Hip flexion 4- 4  Hip extension 3 4-  Hip abduction 3- 4-  Knee flexion 4 5  Knee extension 4 5   LUMBAR SPECIAL TESTS:  Straight leg raise test: Positive  GAIT: Assistive device utilized: None Level of assistance: Complete Independence Comments: Decreased gait speed, slight forward trunk lean, compensated trendelenburg on right   TODAY'S TREATMENT  LTR 3 x 5 sec Hooklying SKTC x 20 sec SLR partial range x 5 Bridge x 5 Side clamshell x 10 Seated hamstring stretch x 20 sec  PATIENT EDUCATION:  Education details: Exam findings, POC, HEP Person educated: Patient Education method: Explanation, Demonstration, Tactile cues, Verbal cues, and Handouts Education comprehension: verbalized understanding, returned demonstration, verbal cues required, tactile cues required, and needs further education  HOME EXERCISE PROGRAM: Access Code:  [email or web address]    ASSESSMENT: CLINICAL IMPRESSION: Patient is a 71 y.o. female who was seen today for physical therapy evaluation and treatment for chronic low back pain. Patient demonstrates slight limitations in lumbar motion with what seems like more of a flexion based directional preference, she exhibits gross hip and core weakness, tenderness with palpation to right gluteal region.   OBJECTIVE IMPAIRMENTS Abnormal gait, decreased activity tolerance, difficulty walking, decreased ROM, decreased strength, impaired flexibility, postural dysfunction, and pain.   ACTIVITY LIMITATIONS cleaning, community activity, meal prep, laundry, yard work, and shopping.   PERSONAL FACTORS Past/current experiences, Time since onset of injury/illness/exacerbation, and 1 comorbidity: DM  are also affecting patient's functional outcome.    REHAB POTENTIAL: Good  CLINICAL DECISION MAKING: Stable/uncomplicated  EVALUATION COMPLEXITY: Low   GOALS: Goals reviewed with patient? Yes  SHORT TERM GOALS:  Patient will be I with initial HEP in order to progress with therapy. Baseline: HEP provided at evaluation Target date: 09/03/2021 Goal status: INITIAL  2.  PT will review FOTO with patient by 3rd visit in order to understand expected progress and outcome with therapy. Baseline: FOTO assessed at eval Target date: 09/03/2021 Goal status: INITIAL  3.  Patient will report pain with walking </= 5/10 in order to improve walking tolerance and distance Baseline: pain up to 8/10 Target date: 09/03/2021 Goal status: INITIAL  LONG TERM GOALS:  Patient  will be I with final HEP to maintain progress from PT. Baseline: HEP provided at evaluation Target date: 10/01/2021 Goal status: INITIAL  2.  Patient will report >/= 61% status on FOTO to indicate improved functional ability. Baseline: 51% functional status Target date: 10/01/2021 Goal status: INITIAL  3.  Patient will demonstrate improved gross core and hip  strength >/= 4-/5 MMT in order to improve walking and standing tolerance Baseline: patient demonstrates gross core and hip weakness of right Target date: 10/01/2021 Goal status: INITIAL  4.  Patient will be able to stand and/or walk >/= 30 minutes without needing seated rest break in order to improve community access Baseline: patient only able to walk 1/2 block (about 10 minutes) Target date: 10/01/2021 Goal status: INITIAL   PLAN: PT FREQUENCY: 1x/week  PT DURATION: 8 weeks  PLANNED INTERVENTIONS: Therapeutic exercises, Therapeutic activity, Neuromuscular re-education, Balance training, Gait training, Patient/Family education, Joint manipulation, Joint mobilization, Aquatic Therapy, Dry Needling, Electrical stimulation, Spinal manipulation, Spinal mobilization, Cryotherapy, Moist heat, Taping, and Manual therapy  PLAN FOR NEXT SESSION: Review HEP and progress PRN, manual/dry needling for right gluteal region, lumbar and hip mobility/stretching, core and hip strengthening with progression to standing exercises as tolerated   Hilda Blades, PT, DPT, LAT, ATC 08/06/21  9:36 AM Phone: [phone] Fax: [phone]   Referring diagnosis? M54.50 Treatment diagnosis? (if different than referring diagnosis) M54.50 What was this (referring dx) caused by? '[]'$  Surgery '[]'$  Fall '[x]'$  Ongoing issue '[]'$  Arthritis '[]'$  Other: ____________  Laterality: '[x]'$  Rt '[]'$  Lt '[]'$  Both  Check all possible CPT codes:  *CHOOSE 10 OR LESS*    '[x]'$  97110 (Therapeutic Exercise)  '[]'$  92507 (SLP Treatment)  '[x]'$  97112 (Neuro Re-ed)   '[]'$  92526 (Swallowing Treatment)   '[x]'$  97116 (Gait Training)   '[]'$  [id number] (Cognitive Training, 1st 15 minutes) '[x]'$  97140 (Manual Therapy)   '[]'$  97130 (Cognitive Training, each add'l 15 minutes)  '[x]'$  97530 (Therapeutic Activities)  '[]'$  Other, List CPT Code ____________    '[x]'$  97535 (Self Care)       '[x]'$  All codes above (97110 - 97535)  '[]'$  97012 (Mechanical Traction)  '[x]'$  97014 (E-stim  Unattended)  '[]'$  97032 (E-stim manual)  '[]'$  97033 (Ionto)  '[]'$  97035 (Ultrasound)  '[]'$  97760 (Orthotic Fit) '[]'$  [id number] (Physical Performance Training) '[x]'$  54656 (Aquatic Therapy) '[]'$  97034 (Contrast Bath) '[]'$  [id number] (Paraffin) '[]'$  81275 (Wound Care 1st 20 sq cm) '[]'$  97598 (Wound Care each add'l 20 sq cm) '[]'$  97016 (Vasopneumatic Device) '[]'$  [phone] Comptroller) '[]'$  [id number] (Prosthetic Training)

## 2021-08-06 ENCOUNTER — Other Ambulatory Visit: Payer: Self-pay

## 2021-08-06 ENCOUNTER — Ambulatory Visit: Payer: Medicare HMO | Attending: Orthopedic Surgery | Admitting: Physical Therapy

## 2021-08-06 ENCOUNTER — Encounter: Payer: Self-pay | Admitting: Physical Therapy

## 2021-08-06 DIAGNOSIS — M545 Low back pain, unspecified: Secondary | ICD-10-CM | POA: Insufficient documentation

## 2021-08-06 DIAGNOSIS — M6281 Muscle weakness (generalized): Secondary | ICD-10-CM | POA: Insufficient documentation

## 2021-08-06 DIAGNOSIS — G8929 Other chronic pain: Secondary | ICD-10-CM | POA: Insufficient documentation

## 2021-08-06 NOTE — Patient Instructions (Signed)
Access Code: [email or web address] ?URL: [email or web address] ?Date: 08/06/2021 ?Prepared by: Hilda Blades ? ?Exercises ?Supine Lower Trunk Rotation - 1-2 x daily - 5 reps - 5 seconds hold ?Hooklying Single Knee to Chest Stretch - 1-2 x daily - 3 reps - 20 seconds hold ?Small Range Straight Leg Raise - 1 x daily - 2 sets - 5 reps ?Bridge - 1-2 x daily - 2 sets - 5 reps ?Clamshell - 1-2 x daily - 2 sets - 10 reps ?Seated Hamstring Stretch - 1-2 x daily - 3 reps - 20 seconds hold ? ?

## 2021-08-09 ENCOUNTER — Encounter: Payer: Self-pay | Admitting: Cardiology

## 2021-08-09 NOTE — Therapy (Signed)
?OUTPATIENT PHYSICAL THERAPY TREATMENT NOTE ? ? ?Patient Name: Danielle Beck ?MRN: [id number] ?DOB:[birth year], 71 y.o., female ?Today's Date: 08/10/2021 ? ?PCP: Gaynelle Arabian, MD ?REFERRING PROVIDER: Gaynelle Arabian, MD ? ? PT End of Session - 08/10/21 1103   ? ? Visit Number 2   ? Number of Visits 8   ? Date for PT Re-Evaluation 10/01/21   ? Authorization Type [employer] MCR   ? PT Start Time 1108   ? PT Stop Time 1154   ? PT Time Calculation (min) 46 min   ? Activity Tolerance Patient tolerated treatment well   ? Behavior During Therapy [facility] for tasks assessed/performed   ? ?  ?  ? ?  ? ? ?Past Medical History:  ?Diagnosis Date  ? CHF (congestive heart failure) ([place])   ? Diabetes mellitus without complication ([place])   ? Hyperlipidemia   ? Hypertension   ? ?Past Surgical History:  ?Procedure Laterality Date  ? CATARACT EXTRACTION    ? TUBAL LIGATION  1985  ? ?Patient Active Problem List  ? Diagnosis Date Noted  ? Moderate mitral regurgitation 04/02/2020  ? Moderate aortic stenosis 04/02/2020  ? PAF (paroxysmal atrial fibrillation) ([place]) 10/15/2019  ? Palpitations 10/15/2019  ? Leg edema 07/17/2019  ? Essential hypertension 06/27/2019  ? Chronic heart failure with preserved ejection fraction ([place]) 06/27/2019  ? Shortness of breath 03/21/2019  ? Hyponatremia 03/21/2019  ? Anemia 03/21/2019  ? Screening cholesterol level 03/21/2019  ? ? ?REFERRING PROVIDER: Phylliss Bob, MD ?  ?REFERRING DIAG: Low back pain ? ?THERAPY DIAG:  ?Chronic bilateral low back pain, unspecified whether sciatica present ? ?Muscle weakness (generalized) ? ?PERTINENT HISTORY: CHF, DM ? ?PRECAUTIONS: None ? ?SUBJECTIVE: Patient reports she is doing well. She has been doing the exercises. Patient accompanied by husband who does provide her with support while walking. ? ?PAIN:  ?Are you having pain? Yes ?NPRS scale: 0/10 (at worst 8/10) ?Pain location: Back ?Pain orientation: Lower  ?PAIN TYPE: Chronic ?Pain description: Intermittent,   ?Aggravating factors: Walking extra ?Relieving factors: Rubbing the leg ? ?PATIENT GOALS: Pain relief and improve walking ability ? ? ?OBJECTIVE:  ?PATIENT SURVEYS:  ?FOTO 51% functional status ?  ?MUSCLE LENGTH: ?Hamstring length limited on right due to right hip/low back pain ?  ?POSTURE:  ?Slight increase lumbar lordosis, rounded shoulders,  ?  ?PALPATION: ?Tenderness with palpation to glute med and max on right ?  ?LUMBAR AROM ?  ?A/PROM AROM  ?08/06/2021  ?Flexion WFL  ?Extension 50%  ?Right lateral flexion 75%  ?Left lateral flexion 75%  ?Right rotation 75%  ?Left rotation 75%  ?         Patient reports right leg pain with lumbar extension ?  ?LE MMT: ?  ?MMT Right ?08/06/2021 Left ?08/06/2021  ?Hip flexion 4- 4  ?Hip extension 3 4-  ?Hip abduction 3- 4-  ?Knee flexion 4 5  ?Knee extension 4 5  ?  ?LUMBAR SPECIAL TESTS:  ?Straight leg raise test: Positive ?  ?GAIT: ?Assistive device utilized: None ?Level of assistance: Complete Independence ?Comments: Decreased gait speed, slight forward trunk lean, compensated trendelenburg on right ?  ?  ?TODAY'S TREATMENT  ?[facility] Adult PT Treatment:                                                DATE: 08/10/2021 ?Therapeutic Exercise: ?NuStep  L5 x 5 min with UE/LE while taking subjective ?Seated hamstring stretch 2 x 20 sec each ?LAQ with 2# 2 x 10 each ?LTR 5 x 5 sec each ?Hooklying clamshell with yellow 2 x 10 ?SLR 2 x 10 each ?Bridge 2 x 5 ?Sit to stand with hands on thighs 2 x 5 ?Manual: ?LAD RLE x 3 bouts ?Passive right knee to chest and piriformis stretch x 2 bouts each ? ? ?[facility] Adult PT Treatment:                                                DATE: 08/06/2021 ?Therapeutic Exercise: ?LTR 3 x 5 sec ?Hooklying SKTC x 20 sec ?SLR partial range x 5 ?Bridge x 5 ?Side clamshell x 10 ?Seated hamstring stretch x 20 sec ?  ?PATIENT EDUCATION:  ?Education details: HEP update ?Person educated: Patient ?Education method: Explanation, Demonstration, Tactile cues, Verbal cues,  Handout ?Education comprehension: verbalized understanding, returned demonstration, verbal cues required, tactile cues required, and needs further education ?  ?HOME EXERCISE PROGRAM: ?Access Code: [email or web address]     ?  ?  ?ASSESSMENT: ?CLINICAL IMPRESSION: ?Patient tolerated therapy well with no adverse effects. Therapy focused primarily on progressing strength and light manual for to reduce pain and improve motion of right hip/lower back. She does continue to report discomfort with exercises around right gluteal region and with fatigue at end of session. Updated HEP to include sit to stand exercise. Patient would benefit from continued skilled PT to progress mobility and strength in order to reduce pain and maximize functional ability. ?  ?  ?OBJECTIVE IMPAIRMENTS Abnormal gait, decreased activity tolerance, difficulty walking, decreased ROM, decreased strength, impaired flexibility, postural dysfunction, and pain.  ?  ?ACTIVITY LIMITATIONS cleaning, community activity, meal prep, laundry, yard work, and shopping.  ?  ?PERSONAL FACTORS Past/current experiences, Time since onset of injury/illness/exacerbation, and 1 comorbidity: DM  are also affecting patient's functional outcome.  ?  ?  ?GOALS: ?Goals reviewed with patient? Yes ?  ?SHORT TERM GOALS: ?  ?Patient will be I with initial HEP in order to progress with therapy. ?Baseline: HEP provided at evaluation ?Target date: 09/03/2021 ?Goal status: INITIAL ?  ?2.  PT will review FOTO with patient by 3rd visit in order to understand expected progress and outcome with therapy. ?Baseline: FOTO assessed at eval ?Target date: 09/03/2021 ?Goal status: INITIAL ?  ?3.  Patient will report pain with walking </= 5/10 in order to improve walking tolerance and distance ?Baseline: pain up to 8/10 ?Target date: 09/03/2021 ?Goal status: INITIAL ?  ?LONG TERM GOALS: ?  ?Patient will be I with final HEP to maintain progress from PT. ?Baseline: HEP provided at evaluation ?Target date:  10/01/2021 ?Goal status: INITIAL ?  ?2.  Patient will report >/= 61% status on FOTO to indicate improved functional ability. ?Baseline: 51% functional status ?Target date: 10/01/2021 ?Goal status: INITIAL ?  ?3.  Patient will demonstrate improved gross core and hip strength >/= 4-/5 MMT in order to improve walking and standing tolerance ?Baseline: patient demonstrates gross core and hip weakness of right ?Target date: 10/01/2021 ?Goal status: INITIAL ?  ?4.  Patient will be able to stand and/or walk >/= 30 minutes without needing seated rest break in order to improve community access ?Baseline: patient only able to walk 1/2 block (about 10 minutes) ?Target date: 10/01/2021 ?Goal status:  INITIAL ?  ?  ?PLAN: ?PT FREQUENCY: 1x/week ?  ?PT DURATION: 8 weeks ?  ?PLANNED INTERVENTIONS: Therapeutic exercises, Therapeutic activity, Neuromuscular re-education, Balance training, Gait training, Patient/Family education, Joint manipulation, Joint mobilization, Aquatic Therapy, Dry Needling, Electrical stimulation, Spinal manipulation, Spinal mobilization, Cryotherapy, Moist heat, Taping, and Manual therapy ?  ?PLAN FOR NEXT SESSION: Review HEP and progress PRN, manual/dry needling for right gluteal region, lumbar and hip mobility/stretching (incorporate lumbar flexion stretching), core and hip strengthening with progression to standing exercises as tolerated ?  ? ? ? ?Hilda Blades, PT, DPT, LAT, ATC ?08/10/21  11:58 AM ?Phone: [phone] ?Fax: [phone] ? ? ?  ? ?

## 2021-08-10 ENCOUNTER — Encounter: Payer: Self-pay | Admitting: Physical Therapy

## 2021-08-10 ENCOUNTER — Ambulatory Visit: Payer: Medicare HMO | Admitting: Physical Therapy

## 2021-08-10 ENCOUNTER — Other Ambulatory Visit: Payer: Self-pay

## 2021-08-10 DIAGNOSIS — M6281 Muscle weakness (generalized): Secondary | ICD-10-CM | POA: Diagnosis not present

## 2021-08-10 DIAGNOSIS — G8929 Other chronic pain: Secondary | ICD-10-CM

## 2021-08-10 DIAGNOSIS — M545 Low back pain, unspecified: Secondary | ICD-10-CM

## 2021-08-10 NOTE — Patient Instructions (Signed)
Access Code: [email or web address] ?URL: [email or web address] ?Date: 08/10/2021 ?Prepared by: Hilda Blades ? ?Exercises ?Supine Lower Trunk Rotation - 1-2 x daily - 5 reps - 5 seconds hold ?Hooklying Single Knee to Chest Stretch - 1-2 x daily - 3 reps - 20 seconds hold ?Small Range Straight Leg Raise - 1 x daily - 2 sets - 5 reps ?Bridge - 1-2 x daily - 2 sets - 5 reps ?Clamshell - 1-2 x daily - 2 sets - 10 reps ?Seated Hamstring Stretch - 1-2 x daily - 3 reps - 20 seconds hold ?Sit to Stand - 1-2 x daily - 2 sets - 5 reps ? ?

## 2021-08-17 ENCOUNTER — Ambulatory Visit: Payer: Medicare HMO | Admitting: Physical Therapy

## 2021-08-17 ENCOUNTER — Encounter: Payer: Self-pay | Admitting: Physical Therapy

## 2021-08-17 ENCOUNTER — Other Ambulatory Visit: Payer: Self-pay

## 2021-08-17 DIAGNOSIS — M545 Low back pain, unspecified: Secondary | ICD-10-CM | POA: Diagnosis not present

## 2021-08-17 DIAGNOSIS — G8929 Other chronic pain: Secondary | ICD-10-CM | POA: Diagnosis not present

## 2021-08-17 DIAGNOSIS — M6281 Muscle weakness (generalized): Secondary | ICD-10-CM | POA: Diagnosis not present

## 2021-08-17 NOTE — Therapy (Signed)
?OUTPATIENT PHYSICAL THERAPY TREATMENT NOTE ? ? ?Patient Name: Danielle Beck ?MRN: [id number] ?DOB:22-Sep-1950, 71 y.o., female ?Today's Date: 08/17/2021 ? ?PCP: Gaynelle Arabian, MD ?REFERRING PROVIDER: Phylliss Bob, MD ? ? PT End of Session - 08/17/21 1102   ? ? Visit Number 3   ? Number of Visits 8   ? Date for PT Re-Evaluation 10/01/21   ? Authorization Type [employer] MCR   ? PT Start Time 1101   ? PT Stop Time 1200   ? PT Time Calculation (min) 59 min   ? ?  ?  ? ?  ? ? ?Past Medical History:  ?Diagnosis Date  ? CHF (congestive heart failure) ([place])   ? Diabetes mellitus without complication ([place])   ? Hyperlipidemia   ? Hypertension   ? ?Past Surgical History:  ?Procedure Laterality Date  ? CATARACT EXTRACTION    ? TUBAL LIGATION  1985  ? ?Patient Active Problem List  ? Diagnosis Date Noted  ? Moderate mitral regurgitation 04/02/2020  ? Moderate aortic stenosis 04/02/2020  ? PAF (paroxysmal atrial fibrillation) ([place]) 10/15/2019  ? Palpitations 10/15/2019  ? Leg edema 07/17/2019  ? Essential hypertension 06/27/2019  ? Chronic heart failure with preserved ejection fraction ([place]) 06/27/2019  ? Shortness of breath 03/21/2019  ? Hyponatremia 03/21/2019  ? Anemia 03/21/2019  ? Screening cholesterol level 03/21/2019  ? ? ?REFERRING PROVIDER: Phylliss Bob, MD ?  ?REFERRING DIAG: Low back pain ? ?THERAPY DIAG:  ?Chronic bilateral low back pain, unspecified whether sciatica present ? ?Muscle weakness (generalized) ? ?PERTINENT HISTORY: CHF, DM ? ?PRECAUTIONS: None ? ?SUBJECTIVE: Patient reports she is doing well. She has been doing the exercises. Patient accompanied by husband who does provide her with support while walking. ? ?PAIN:  ?Are you having pain? Yes ?NPRS scale: 0/10 (at worst 8/10) ?Pain location: Back ?Pain orientation: Lower  ?PAIN TYPE: Chronic ?Pain description: Intermittent,  ?Aggravating factors: Walking extra ?Relieving factors: Rubbing the leg ? ?PATIENT GOALS: Pain relief and improve walking  ability ? ? ?OBJECTIVE:  ?PATIENT SURVEYS:  ?FOTO 51% functional status ?  ?MUSCLE LENGTH: ?Hamstring length limited on right due to right hip/low back pain ?  ?POSTURE:  ?Slight increase lumbar lordosis, rounded shoulders,  ?  ?PALPATION: ?Tenderness with palpation to glute med and max on right ?  ?LUMBAR AROM ?  ?A/PROM AROM  ?08/06/2021  ?Flexion WFL  ?Extension 50%  ?Right lateral flexion 75%  ?Left lateral flexion 75%  ?Right rotation 75%  ?Left rotation 75%  ?         Patient reports right leg pain with lumbar extension ?  ?LE MMT: ?  ?MMT Right ?08/06/2021 Left ?08/06/2021  ?Hip flexion 4- 4  ?Hip extension 3 4-  ?Hip abduction 3- 4-  ?Knee flexion 4 5  ?Knee extension 4 5  ?  ?LUMBAR SPECIAL TESTS:  ?Straight leg raise test: Positive ?  ?GAIT: ?Assistive device utilized: None ?Level of assistance: Complete Independence ?Comments: Decreased gait speed, slight forward trunk lean, compensated trendelenburg on right ?  ?  ?TODAY'S TREATMENT  ?[facility] Adult PT Treatment:                                                DATE: 08/17/2021 ?Therapeutic Exercise: ?NuStep L3 x 5 min with UE/LE while taking subjective ?Hooklying clamshell with yellow 2 x 10, red 1x 10  ?  Ball squeeze 5 sec x 10 ?LAQ with 2# 2 x 10 each ?LTR 5 x 5 sec each ?Bridge 1 x 10 ?SLR 2 x 10 each ?S/L Clam  x 10 each  ?Sit to stand with hands on thighs x 10 ?Manual: ?S/L STW to right lateral hip-  ? ?Modalities: Trial of HMP to right  lateral hip in sldelying x 15 min ? ?Self Care: Pt given Biofreeze samples; Discussed using heat, ice, muscle rub, self massage to decrease pain ? ?[facility] Adult PT Treatment:                                                DATE: 08/10/2021 ?Therapeutic Exercise: ?NuStep L5 x 5 min with UE/LE while taking subjective ?Seated hamstring stretch 2 x 20 sec each ?LAQ with 2# 2 x 10 each ?LTR 5 x 5 sec each ?Hooklying clamshell with yellow 2 x 10 ?SLR 2 x 10 each ?Bridge 2 x 5 ?Sit to stand with hands on thighs 2 x 5 ?Manual: ?LAD RLE x  3 bouts ?Passive right knee to chest and piriformis stretch x 2 bouts each ? ? ?[facility] Adult PT Treatment:                                                DATE: 08/06/2021 ?Therapeutic Exercise: ?LTR 3 x 5 sec ?Hooklying SKTC x 20 sec ?SLR partial range x 5 ?Bridge x 5 ?Side clamshell x 10 ?Seated hamstring stretch x 20 sec ?  ?PATIENT EDUCATION:  ?Education details: HEP update ?Person educated: Patient ?Education method: Explanation, Demonstration, Tactile cues, Verbal cues, Handout ?Education comprehension: verbalized understanding, returned demonstration, verbal cues required, tactile cues required, and needs further education ?  ?HOME EXERCISE PROGRAM: ?Access Code: [email or web address]     ?  ?  ?ASSESSMENT: ?CLINICAL IMPRESSION: ?Patient tolerated therapy well with no adverse effects. Therapy focused primarily on progressing strength and light manual for to reduce pain and improve motion of right hip/lower back. She does continue to report discomfort with exercises around right gluteal region and with fatigue at end of session. Discussed pain management strategies including heat, ice , massage and muscle rub. Pt is not currently using any pain management tools at home. Trial of HMP and gentle massage in clinic and pt was given Biofreeze samples to try at home. Will assess benefit next session.  Patient would benefit from continued skilled PT to progress mobility and strength in order to reduce pain and maximize functional ability. ?  ?  ?OBJECTIVE IMPAIRMENTS Abnormal gait, decreased activity tolerance, difficulty walking, decreased ROM, decreased strength, impaired flexibility, postural dysfunction, and pain.  ?  ?ACTIVITY LIMITATIONS cleaning, community activity, meal prep, laundry, yard work, and shopping.  ?  ?PERSONAL FACTORS Past/current experiences, Time since onset of injury/illness/exacerbation, and 1 comorbidity: DM  are also affecting patient's functional outcome.  ?  ?  ?GOALS: ?Goals reviewed with patient? Yes ?   ?SHORT TERM GOALS: ?  ?Patient will be I with initial HEP in order to progress with therapy. ?Baseline: HEP provided at evaluation ?Target date: 09/03/2021 ?Goal status: INITIAL ?  ?2.  PT will review FOTO with patient by 3rd visit in order to understand expected progress and outcome with therapy. ?  Baseline: FOTO assessed at eval ?Target date: 09/03/2021 ?Goal status: INITIAL ?  ?3.  Patient will report pain with walking </= 5/10 in order to improve walking tolerance and distance ?Baseline: pain up to 8/10 ?Target date: 09/03/2021 ?Goal status: INITIAL ?  ?LONG TERM GOALS: ?  ?Patient will be I with final HEP to maintain progress from PT. ?Baseline: HEP provided at evaluation ?Target date: 10/01/2021 ?Goal status: INITIAL ?  ?2.  Patient will report >/= 61% status on FOTO to indicate improved functional ability. ?Baseline: 51% functional status ?Target date: 10/01/2021 ?Goal status: INITIAL ?  ?3.  Patient will demonstrate improved gross core and hip strength >/= 4-/5 MMT in order to improve walking and standing tolerance ?Baseline: patient demonstrates gross core and hip weakness of right ?Target date: 10/01/2021 ?Goal status: INITIAL ?  ?4.  Patient will be able to stand and/or walk >/= 30 minutes without needing seated rest break in order to improve community access ?Baseline: patient only able to walk 1/2 block (about 10 minutes) ?Target date: 10/01/2021 ?Goal status: INITIAL ?  ?  ?PLAN: ?PT FREQUENCY: 1x/week ?  ?PT DURATION: 8 weeks ?  ?PLANNED INTERVENTIONS: Therapeutic exercises, Therapeutic activity, Neuromuscular re-education, Balance training, Gait training, Patient/Family education, Joint manipulation, Joint mobilization, Aquatic Therapy, Dry Needling, Electrical stimulation, Spinal manipulation, Spinal mobilization, Cryotherapy, Moist heat, Taping, and Manual therapy ?  ?PLAN FOR NEXT SESSION: assess response from last session. Did she try biofreeze or heat at home?  consider showing tennis ball self massage.  Review HEP and progress PRN, manual/dry needling for right gluteal region, lumbar and hip mobility/stretching (incorporate lumbar flexion stretching), core and hip strengthening with progression to standing exer

## 2021-08-24 ENCOUNTER — Ambulatory Visit: Payer: Medicare HMO

## 2021-08-24 ENCOUNTER — Other Ambulatory Visit: Payer: Self-pay

## 2021-08-24 DIAGNOSIS — M6281 Muscle weakness (generalized): Secondary | ICD-10-CM

## 2021-08-24 DIAGNOSIS — G8929 Other chronic pain: Secondary | ICD-10-CM | POA: Diagnosis not present

## 2021-08-24 DIAGNOSIS — M545 Low back pain, unspecified: Secondary | ICD-10-CM

## 2021-08-24 NOTE — Therapy (Signed)
?OUTPATIENT PHYSICAL THERAPY TREATMENT NOTE ? ? ?Patient Name: Danielle Beck ?MRN: [id number] ?DOB:11-Apr-1951, 71 y.o., female ?Today's Date: 08/24/2021 ? ?PCP: Gaynelle Arabian, MD ?REFERRING PROVIDER: Phylliss Bob, MD ? ? PT End of Session - 08/24/21 1127   ? ? Visit Number 4   ? Number of Visits 8   ? Date for PT Re-Evaluation 10/01/21   ? Authorization Type [employer] MCR   ? PT Start Time 1128   ? PT Stop Time 1208   ? PT Time Calculation (min) 40 min   ? Activity Tolerance Patient tolerated treatment well   ? Behavior During Therapy [facility] for tasks assessed/performed   ? ?  ?  ? ?  ? ? ? ?Past Medical History:  ?Diagnosis Date  ? CHF (congestive heart failure) ([place])   ? Diabetes mellitus without complication ([place])   ? Hyperlipidemia   ? Hypertension   ? ?Past Surgical History:  ?Procedure Laterality Date  ? CATARACT EXTRACTION    ? TUBAL LIGATION  1985  ? ?Patient Active Problem List  ? Diagnosis Date Noted  ? Moderate mitral regurgitation 04/02/2020  ? Moderate aortic stenosis 04/02/2020  ? PAF (paroxysmal atrial fibrillation) ([place]) 10/15/2019  ? Palpitations 10/15/2019  ? Leg edema 07/17/2019  ? Essential hypertension 06/27/2019  ? Chronic heart failure with preserved ejection fraction ([place]) 06/27/2019  ? Shortness of breath 03/21/2019  ? Hyponatremia 03/21/2019  ? Anemia 03/21/2019  ? Screening cholesterol level 03/21/2019  ? ? ?REFERRING PROVIDER: Phylliss Bob, MD ?  ?REFERRING DIAG: Low back pain ? ?THERAPY DIAG:  ?Chronic bilateral low back pain, unspecified whether sciatica present ? ?Muscle weakness (generalized) ? ?PERTINENT HISTORY: CHF, DM ? ?PRECAUTIONS: None ? ?SUBJECTIVE: Patient reports she is doing well. She has been doing the exercises. Patient accompanied by husband who does provide her with support while walking. ? ?PAIN:  ?Are you having pain? Yes ?NPRS scale: 6/10 (at worst 8/10) ?Pain location: Back ?Pain orientation: Lower  ?PAIN TYPE: Chronic ?Pain description: Intermittent,   ?Aggravating factors: Walking extra ?Relieving factors: Rubbing the leg ? ?PATIENT GOALS: Pain relief and improve walking ability ? ? ?OBJECTIVE:  ?PATIENT SURVEYS:  ?FOTO 51% functional status ?  ?MUSCLE LENGTH: ?Hamstring length limited on right due to right hip/low back pain ?  ?POSTURE:  ?Slight increase lumbar lordosis, rounded shoulders,  ?  ?PALPATION: ?Tenderness with palpation to glute med and max on right ?  ?LUMBAR AROM ?  ?A/PROM AROM  ?08/06/2021  ?Flexion WFL  ?Extension 50%  ?Right lateral flexion 75%  ?Left lateral flexion 75%  ?Right rotation 75%  ?Left rotation 75%  ?         Patient reports right leg pain with lumbar extension ?  ?LE MMT: ?  ?MMT Right ?08/06/2021 Left ?08/06/2021  ?Hip flexion 4- 4  ?Hip extension 3 4-  ?Hip abduction 3- 4-  ?Knee flexion 4 5  ?Knee extension 4 5  ?  ?LUMBAR SPECIAL TESTS:  ?Straight leg raise test: Positive ?  ?GAIT: ?Assistive device utilized: None ?Level of assistance: Complete Independence ?Comments: Decreased gait speed, slight forward trunk lean, compensated trendelenburg on right ?  ?  ?TODAY'S TREATMENT  ?[facility] Adult PT Treatment:                                                DATE: 08/24/2021 ?Therapeutic Exercise: ?  NuStep L4 x 5 min with UE/LE while taking subjective ?Hooklying clamshell with RTB 3 x 10  ?Supine march RTB 2x10 BIL ?Ball squeeze 5 sec 2 x 10 ?LAQ with 3# 2 x 10 each ?Seated hamstring curl GTB 2x10 BIL ?LTR 10 x 5 sec each ?Bridge 2 x 10 ?SLR 2 x 10 each ?Supine hamstring stretch 2x30" BIL ?S/L Clam 2  x 10 each  ?Sit to stand with hands on thighs x 10 ? ? ? ?[facility] Adult PT Treatment:                                                DATE: 08/17/2021 ?Therapeutic Exercise: ?NuStep L3 x 5 min with UE/LE while taking subjective ?Hooklying clamshell with yellow 2 x 10, red 1x 10  ?Ball squeeze 5 sec x 10 ?LAQ with 2# 2 x 10 each ?LTR 5 x 5 sec each ?Bridge 1 x 10 ?SLR 2 x 10 each ?S/L Clam  x 10 each  ?Sit to stand with hands on thighs x  10 ?Manual: ?S/L STW to right lateral hip-  ? ?Modalities: Trial of HMP to right  lateral hip in sldelying x 15 min ? ?Self Care: Pt given Biofreeze samples; Discussed using heat, ice, muscle rub, self massage to decrease pain ? ?[facility] Adult PT Treatment:                                                DATE: 08/10/2021 ?Therapeutic Exercise: ?NuStep L5 x 5 min with UE/LE while taking subjective ?Seated hamstring stretch 2 x 20 sec each ?LAQ with 2# 2 x 10 each ?LTR 5 x 5 sec each ?Hooklying clamshell with yellow 2 x 10 ?SLR 2 x 10 each ?Bridge 2 x 5 ?Sit to stand with hands on thighs 2 x 5 ?Manual: ?LAD RLE x 3 bouts ?Passive right knee to chest and piriformis stretch x 2 bouts each ? ? ?  ?PATIENT EDUCATION:  ?Education details: HEP update ?Person educated: Patient ?Education method: Explanation, Demonstration, Tactile cues, Verbal cues, Handout ?Education comprehension: verbalized understanding, returned demonstration, verbal cues required, tactile cues required, and needs further education ?  ?HOME EXERCISE PROGRAM: ?Access Code: [email or web address]     ?  ?  ?ASSESSMENT: ?CLINICAL IMPRESSION: ?Patient presents to therapy with moderate low back pain and reports daily HEP compliance. Session today focused on improving proximal hip and core strength. She reports pain during exercises in her R gluteal and hamstring region. Patient continues to benefit from skilled PT services and should be progressed as able to improve functional independence. ?  ?  ?OBJECTIVE IMPAIRMENTS Abnormal gait, decreased activity tolerance, difficulty walking, decreased ROM, decreased strength, impaired flexibility, postural dysfunction, and pain.  ?  ?ACTIVITY LIMITATIONS cleaning, community activity, meal prep, laundry, yard work, and shopping.  ?  ?PERSONAL FACTORS Past/current experiences, Time since onset of injury/illness/exacerbation, and 1 comorbidity: DM  are also affecting patient's functional outcome.  ?  ?  ?GOALS: ?Goals reviewed with  patient? Yes ?  ?SHORT TERM GOALS: ?  ?Patient will be I with initial HEP in order to progress with therapy. ?Baseline: HEP provided at evaluation ?Target date: 09/03/2021 ?Goal status: INITIAL ?  ?2.  PT will review FOTO  with patient by 3rd visit in order to understand expected progress and outcome with therapy. ?Baseline: FOTO assessed at eval ?Target date: 09/03/2021 ?Goal status: INITIAL ?  ?3.  Patient will report pain with walking </= 5/10 in order to improve walking tolerance and distance ?Baseline: pain up to 8/10 ?Target date: 09/03/2021 ?Goal status: INITIAL ?  ?LONG TERM GOALS: ?  ?Patient will be I with final HEP to maintain progress from PT. ?Baseline: HEP provided at evaluation ?Target date: 10/01/2021 ?Goal status: INITIAL ?  ?2.  Patient will report >/= 61% status on FOTO to indicate improved functional ability. ?Baseline: 51% functional status ?Target date: 10/01/2021 ?Goal status: INITIAL ?  ?3.  Patient will demonstrate improved gross core and hip strength >/= 4-/5 MMT in order to improve walking and standing tolerance ?Baseline: patient demonstrates gross core and hip weakness of right ?Target date: 10/01/2021 ?Goal status: INITIAL ?  ?4.  Patient will be able to stand and/or walk >/= 30 minutes without needing seated rest break in order to improve community access ?Baseline: patient only able to walk 1/2 block (about 10 minutes) ?Target date: 10/01/2021 ?Goal status: INITIAL ?  ?  ?PLAN: ?PT FREQUENCY: 1x/week ?  ?PT DURATION: 8 weeks ?  ?PLANNED INTERVENTIONS: Therapeutic exercises, Therapeutic activity, Neuromuscular re-education, Balance training, Gait training, Patient/Family education, Joint manipulation, Joint mobilization, Aquatic Therapy, Dry Needling, Electrical stimulation, Spinal manipulation, Spinal mobilization, Cryotherapy, Moist heat, Taping, and Manual therapy ?  ?PLAN FOR NEXT SESSION: assess response from last session. Did she try biofreeze or heat at home?  consider showing tennis ball self  massage. Review HEP and progress PRN, manual/dry needling for right gluteal region, lumbar and hip mobility/stretching (incorporate lumbar flexion stretching), core and hip strengthening with progression to standing exercises as

## 2021-08-25 DIAGNOSIS — M48062 Spinal stenosis, lumbar region with neurogenic claudication: Secondary | ICD-10-CM | POA: Diagnosis not present

## 2021-09-01 DIAGNOSIS — E113511 Type 2 diabetes mellitus with proliferative diabetic retinopathy with macular edema, right eye: Secondary | ICD-10-CM | POA: Diagnosis not present

## 2021-09-06 NOTE — Therapy (Signed)
?OUTPATIENT PHYSICAL THERAPY TREATMENT NOTE ? ? ?Patient Name: Danielle Beck ?MRN: [id number] ?DOB:12-01-1950, 71 y.o., female ?Today's Date: 09/07/2021 ? ?PCP: Gaynelle Arabian, MD ?REFERRING PROVIDER: Phylliss Bob, MD ? ? PT End of Session - 09/07/21 1049   ? ? Visit Number 5   ? Number of Visits 8   ? Date for PT Re-Evaluation 10/01/21   ? Authorization Type [employer] MCR   ? Authorization Time Period 08/08/2021 - 10/07/2021   ? Authorization - Visit Number 4   ? Authorization - Number of Visits 10   ? PT Start Time 1045   ? PT Stop Time 1130   ? PT Time Calculation (min) 45 min   ? Activity Tolerance Patient tolerated treatment well   ? Behavior During Therapy [facility] for tasks assessed/performed   ? ?  ?  ? ?  ? ? ? ? ?Past Medical History:  ?Diagnosis Date  ? CHF (congestive heart failure) ([place])   ? Diabetes mellitus without complication ([place])   ? Hyperlipidemia   ? Hypertension   ? ?Past Surgical History:  ?Procedure Laterality Date  ? CATARACT EXTRACTION    ? TUBAL LIGATION  1985  ? ?Patient Active Problem List  ? Diagnosis Date Noted  ? Moderate mitral regurgitation 04/02/2020  ? Moderate aortic stenosis 04/02/2020  ? PAF (paroxysmal atrial fibrillation) ([place]) 10/15/2019  ? Palpitations 10/15/2019  ? Leg edema 07/17/2019  ? Essential hypertension 06/27/2019  ? Chronic heart failure with preserved ejection fraction ([place]) 06/27/2019  ? Shortness of breath 03/21/2019  ? Hyponatremia 03/21/2019  ? Anemia 03/21/2019  ? Screening cholesterol level 03/21/2019  ? ? ?REFERRING PROVIDER: Phylliss Bob, MD ?  ?REFERRING DIAG: Low back pain ? ?THERAPY DIAG:  ?Chronic bilateral low back pain, unspecified whether sciatica present ? ?Muscle weakness (generalized) ? ?PERTINENT HISTORY: CHF, DM ? ?PRECAUTIONS: None ? ?SUBJECTIVE: Patient reports she is doing better, she had an injection in her back about 10 days ago and has noticed much difference yet. She continues to work on exercise consistently. She still has pain when she  walks or stands more. ? ?PAIN:  ?Are you having pain? Yes ?NPRS scale: 6/10 (at worst 7/10) ?Pain location: Back ?Pain orientation: Lower  ?PAIN TYPE: Chronic ?Pain description: Intermittent,  ?Aggravating factors: Walking extra ?Relieving factors: Rubbing the leg ? ?PATIENT GOALS: Pain relief and improve walking ability ? ? ?OBJECTIVE:  ?PATIENT SURVEYS:  ?FOTO 58% functional status - 09/07/2021 (51% at evaluation) ?  ?MUSCLE LENGTH: ?Hamstring length limited on right due to right hip/low back pain ?  ?POSTURE:  ?Slight increase lumbar lordosis, rounded shoulders,  ?  ?PALPATION: ?Tenderness with palpation to glute med and max on right ?  ?LUMBAR AROM ?  ?A/PROM AROM  ?08/06/2021  ?Flexion WFL  ?Extension 50%  ?Right lateral flexion 75%  ?Left lateral flexion 75%  ?Right rotation 75%  ?Left rotation 75%  ?         Patient reports right leg pain with lumbar extension ?  ?LE MMT: ?  ?MMT Right ?08/06/2021 Left ?08/06/2021  ?Hip flexion 4- 4  ?Hip extension 3 4-  ?Hip abduction 3- 4-  ?Knee flexion 4 5  ?Knee extension 4 5  ?  ?LUMBAR SPECIAL TESTS:  ?Straight leg raise test: Positive ?  ?GAIT: ?Assistive device utilized: None ?Level of assistance: Complete Independence ?Comments: Decreased gait speed, slight forward trunk lean, compensated trendelenburg on right ?  ?  ?TODAY'S TREATMENT  ?[facility] Adult PT Treatment:  DATE: 09/07/2021 ?Therapeutic Exercise: ?NuStep L5 x 5 min with UE/LE while taking subjective ?LTR x 10 ?Hooklying clamshell with green 2 x 10  ?Supine alternating march with green 2 x 10 ?Bridge 2 x 5 ?SLR 2 x 10 each ?Sidelying hip abduction 2 x 10 each ?Supine piriformis stretch 2 x 20 sec each ?Seated hamstring stretch 2 x 20 sec each ?Sit to stand with hands on thighs 2 x 10 ? ? ?[facility] Adult PT Treatment:                                                DATE: 08/24/2021 ?Therapeutic Exercise: ?NuStep L4 x 5 min with UE/LE while taking subjective ?Hooklying clamshell  with RTB 3 x 10  ?Supine march RTB 2x10 BIL ?Ball squeeze 5 sec 2 x 10 ?LAQ with 3# 2 x 10 each ?Seated hamstring curl GTB 2x10 BIL ?LTR 10 x 5 sec each ?Bridge 2 x 10 ?SLR 2 x 10 each ?Supine hamstring stretch 2x30" BIL ?S/L Clam 2  x 10 each  ?Sit to stand with hands on thighs x 10 ? ?[facility] Adult PT Treatment:                                                DATE: 08/17/2021 ?Therapeutic Exercise: ?NuStep L3 x 5 min with UE/LE while taking subjective ?Hooklying clamshell with yellow 2 x 10, red 1x 10  ?Ball squeeze 5 sec x 10 ?LAQ with 2# 2 x 10 each ?LTR 5 x 5 sec each ?Bridge 1 x 10 ?SLR 2 x 10 each ?S/L Clam  x 10 each  ?Sit to stand with hands on thighs x 10 ?Manual: ?S/L STW to right lateral hip-  ?Modalities: Trial of HMP to right  lateral hip in sldelying x 15 min ?Self Care: Pt given Biofreeze samples; Discussed using heat, ice, muscle rub, self massage to decrease pain ?  ?PATIENT EDUCATION:  ?Education details: HEP, FOTO ?Person educated: Patient ?Education method: Explanation, Demonstration, Tactile cues, Verbal cues, Handout ?Education comprehension: verbalized understanding, returned demonstration, verbal cues required, tactile cues required, and needs further education ?  ?HOME EXERCISE PROGRAM: ?Access Code: [email or web address]     ?  ?  ?ASSESSMENT: ?CLINICAL IMPRESSION: ?Patient tolerated therapy well with no adverse effects. She reports improvement in her functional ability on FOTO this visit. Therapy continues to focus on progression of lumbar mobility and core/hip strengthening with improved tolerance. She was able to tolerate greater resistance and progressions of exercises, and updated HEP. She does continue to report increased pain with activity so would benefit from continued strengthening to improve activity tolerance. Patient would benefit from continued skilled PT to progress mobility and strength in order to reduce pain and maximize functional ability. ?  ?  ?OBJECTIVE IMPAIRMENTS Abnormal gait,  decreased activity tolerance, difficulty walking, decreased ROM, decreased strength, impaired flexibility, postural dysfunction, and pain.  ?  ?ACTIVITY LIMITATIONS cleaning, community activity, meal prep, laundry, yard work, and shopping.  ?  ?PERSONAL FACTORS Past/current experiences, Time since onset of injury/illness/exacerbation, and 1 comorbidity: DM  are also affecting patient's functional outcome.  ?  ?  ?GOALS: ?Goals reviewed with patient? Yes ?  ?SHORT TERM GOALS: ?  ?Patient will be  I with initial HEP in order to progress with therapy. ?Baseline: HEP provided at evaluation ?09/07/2021: independent with HEP ?Target date: 09/03/2021 ?Goal status: MET ?  ?2.  PT will review FOTO with patient by 3rd visit in order to understand expected progress and outcome with therapy. ?Baseline: FOTO assessed at eval ?09/07/2021: reviewed and reassessed ?Target date: 09/03/2021 ?Goal status: MET ?  ?3.  Patient will report pain with walking </= 5/10 in order to improve walking tolerance and distance ?Baseline: pain up to 8/10 ?09/07/2021: pain up to 7/10 ?Target date: 09/03/2021 ?Goal status: ONGOING ?  ?LONG TERM GOALS: ?  ?Patient will be I with final HEP to maintain progress from PT. ?Baseline: HEP provided at evaluation ?Target date: 10/01/2021 ?Goal status: INITIAL ?  ?2.  Patient will report >/= 61% status on FOTO to indicate improved functional ability. ?Baseline: 51% functional status ?Target date: 10/01/2021 ?Goal status: INITIAL ?  ?3.  Patient will demonstrate improved gross core and hip strength >/= 4-/5 MMT in order to improve walking and standing tolerance ?Baseline: patient demonstrates gross core and hip weakness of right ?Target date: 10/01/2021 ?Goal status: INITIAL ?  ?4.  Patient will be able to stand and/or walk >/= 30 minutes without needing seated rest break in order to improve community access ?Baseline: patient only able to walk 1/2 block (about 10 minutes) ?Target date: 10/01/2021 ?Goal status: INITIAL ?  ?   ?PLAN: ?PT FREQUENCY: 1x/week ?  ?PT DURATION: 8 weeks ?  ?PLANNED INTERVENTIONS: Therapeutic exercises, Therapeutic activity, Neuromuscular re-education, Balance training, Gait training, Patient/Family educa

## 2021-09-07 ENCOUNTER — Encounter: Payer: Self-pay | Admitting: Physical Therapy

## 2021-09-07 ENCOUNTER — Ambulatory Visit: Payer: Medicare HMO | Attending: Orthopedic Surgery | Admitting: Physical Therapy

## 2021-09-07 ENCOUNTER — Other Ambulatory Visit: Payer: Self-pay

## 2021-09-07 DIAGNOSIS — G8929 Other chronic pain: Secondary | ICD-10-CM | POA: Diagnosis not present

## 2021-09-07 DIAGNOSIS — M6281 Muscle weakness (generalized): Secondary | ICD-10-CM | POA: Insufficient documentation

## 2021-09-07 DIAGNOSIS — M545 Low back pain, unspecified: Secondary | ICD-10-CM | POA: Diagnosis not present

## 2021-09-07 NOTE — Patient Instructions (Signed)
Access Code: [email or web address] ?URL: [email or web address] ?Date: 09/07/2021 ?Prepared by: Hilda Blades ? ?Exercises ?- Supine Lower Trunk Rotation  - 1 x daily - 5 reps - 5 seconds hold ?- Supine Piriformis Stretch with Foot on Ground  - 1 x daily - 2 reps - 20 seconds hold ?- Small Range Straight Leg Raise  - 1 x daily - 2 sets - 10 reps ?- Bridge  - 1 x daily - 2 sets - 5 reps ?- Hooklying Clamshell with Resistance  - 1 x daily - 2 sets - 10 reps ?- Supine March with Resistance Band  - 1 x daily - 2 sets - 10 reps ?- Sidelying Hip Abduction  - 1 x daily - 2 sets - 10 reps ?- Seated Hamstring Stretch  - 1 x daily - 2 reps - 20 seconds hold ?- Sit to Stand  - 1 x daily - 2 sets - 5 reps ?

## 2021-09-12 NOTE — Therapy (Signed)
?OUTPATIENT PHYSICAL THERAPY TREATMENT NOTE ? ? ?Patient Name: Danielle Beck ?MRN: [id number] ?DOB:[birth year], 71 y.o., female ?Today's Date: 09/14/2021 ? ?PCP: Gaynelle Arabian, MD ?REFERRING PROVIDER: Phylliss Bob, MD ? ? PT End of Session - 09/14/21 1052   ? ? Visit Number 6   ? Number of Visits 8   ? Date for PT Re-Evaluation 10/01/21   ? Authorization Type [employer] MCR   ? Authorization Time Period 08/08/2021 - 10/07/2021   ? Authorization - Visit Number 5   ? Authorization - Number of Visits 10   ? PT Start Time 1045   ? PT Stop Time 1130   ? PT Time Calculation (min) 45 min   ? Activity Tolerance Patient tolerated treatment well   ? Behavior During Therapy [facility] for tasks assessed/performed   ? ?  ?  ? ?  ? ? ? ? ? ?Past Medical History:  ?Diagnosis Date  ? CHF (congestive heart failure) ([place])   ? Diabetes mellitus without complication ([place])   ? Hyperlipidemia   ? Hypertension   ? ?Past Surgical History:  ?Procedure Laterality Date  ? CATARACT EXTRACTION    ? TUBAL LIGATION  1985  ? ?Patient Active Problem List  ? Diagnosis Date Noted  ? Moderate mitral regurgitation 04/02/2020  ? Moderate aortic stenosis 04/02/2020  ? PAF (paroxysmal atrial fibrillation) ([place]) 10/15/2019  ? Palpitations 10/15/2019  ? Leg edema 07/17/2019  ? Essential hypertension 06/27/2019  ? Chronic heart failure with preserved ejection fraction ([place]) 06/27/2019  ? Shortness of breath 03/21/2019  ? Hyponatremia 03/21/2019  ? Anemia 03/21/2019  ? Screening cholesterol level 03/21/2019  ? ? ?REFERRING PROVIDER: Phylliss Bob, MD ?  ?REFERRING DIAG: Low back pain ? ?THERAPY DIAG:  ?Chronic bilateral low back pain, unspecified whether sciatica present ? ?Muscle weakness (generalized) ? ?PERTINENT HISTORY: CHF, DM ? ?PRECAUTIONS: None ? ?SUBJECTIVE: Patient reports yesterday and today she was having more numbness in the right leg. ? ?PAIN:  ?Are you having pain? Yes ?NPRS scale: 6/10 (at worst 7/10) ?Pain location: Back ?Pain orientation: Lower   ?PAIN TYPE: Chronic ?Pain description: Intermittent,  ?Aggravating factors: Walking extra ?Relieving factors: Rubbing the leg ? ?PATIENT GOALS: Pain relief and improve walking ability ? ? ?OBJECTIVE:  ?PATIENT SURVEYS:  ?FOTO 58% functional status - 09/07/2021 (51% at evaluation) ?  ?MUSCLE LENGTH: ?Hamstring length limited on right due to right hip/low back pain ?  ?POSTURE:  ?Slight increase lumbar lordosis, rounded shoulders,  ?  ?PALPATION: ?Tenderness with palpation to glute med and max on right ?  ?LUMBAR AROM ?  ?A/PROM AROM  ?08/06/2021  ?Flexion WFL  ?Extension 50%  ?Right lateral flexion 75%  ?Left lateral flexion 75%  ?Right rotation 75%  ?Left rotation 75%  ?         Patient reports right leg pain with lumbar extension ?  ?LE MMT: ?  ?MMT Right ?08/06/2021 Left ?08/06/2021 Rt / Lt ?09/14/2021  ?Hip flexion 4- 4   ?Hip extension 3 4- 3 / 4-  ?Hip abduction 3- 4- 3 / 4-  ?Knee flexion 4 5   ?Knee extension 4 5   ?  ?LUMBAR SPECIAL TESTS:  ?Straight leg raise test: Positive ?  ?GAIT: ?Assistive device utilized: None ?Level of assistance: Complete Independence ?Comments: Decreased gait speed, slight forward trunk lean, compensated trendelenburg on right ?  ?  ?TODAY'S TREATMENT  ?[facility] Adult PT Treatment:  DATE: 09/14/2021 ?Therapeutic Exercise: ?NuStep L5 x 5 min with UE/LE while taking subjective ?Seated hamstring stretch 2 x 20 sec each ?LTR x 10 ?Supine piriformis stretch 2 x 20 sec each ?Hooklying clamshell with green 2 x 10 ?Bridge 2 x 10 ?SLR 2 x 10 each ?Sidelying hip abduction 2 x 10 each ?Sit to stand with hands on thighs 3 x 5 ?Seated lumbar flexion physioball roll out 5 x 5 sec ?Manual: ?LAD for RLE x 5 bouts ?STM for right hip and thigh using roller in sidelying ? ? ?[place] Adult PT Treatment:                                                DATE: 09/07/2021 ?Therapeutic Exercise: ?NuStep L5 x 5 min with UE/LE while taking subjective ?LTR x 10 ?Hooklying  clamshell with green 2 x 10  ?Supine alternating march with green 2 x 10 ?Bridge 2 x 10 ?SLR 2 x 10 each ?Sidelying hip abduction 2 x 10 each ?Supine piriformis stretch 2 x 20 sec each ?Seated hamstring stretch 2 x 20 sec each ?Sit to stand with hands on thighs 2 x 10 ? ?[facility] Adult PT Treatment:                                                DATE: 08/24/2021 ?Therapeutic Exercise: ?NuStep L4 x 5 min with UE/LE while taking subjective ?Hooklying clamshell with RTB 3 x 10  ?Supine march RTB 2x10 BIL ?Ball squeeze 5 sec 2 x 10 ?LAQ with 3# 2 x 10 each ?Seated hamstring curl GTB 2x10 BIL ?LTR 10 x 5 sec each ?Bridge 2 x 10 ?SLR 2 x 10 each ?Supine hamstring stretch 2x30" BIL ?S/L Clam 2  x 10 each  ?Sit to stand with hands on thighs x 10 ?  ?PATIENT EDUCATION:  ?Education details: HEP ?Person educated: Patient ?Education method: Explanation, Demonstration, Tactile cues, Verbal cues ?Education comprehension: verbalized understanding, returned demonstration, verbal cues required, tactile cues required, and needs further education ?  ?HOME EXERCISE PROGRAM: ?Access Code: [email or web address]     ?  ?  ?ASSESSMENT: ?CLINICAL IMPRESSION: ?Patient tolerated therapy well with no adverse effects. She did arrive reporting increased numbness and pain in the RLE this visit. Therapy continues to focus on progressing lumbar and hip mobility and core and hip strength. Trialed gentle manual this visit but patient reports minimal benefit, she has difficulty relaxing for manual therapy. She continues to exhibit gross right hip strength deficits but improved since evaluation. She does follow-up with her doctor who previous gave her injections on 09/20/2021. Patient would benefit from continued skilled PT to progress mobility and strength in order to reduce pain and maximize functional ability. ?  ?  ?OBJECTIVE IMPAIRMENTS Abnormal gait, decreased activity tolerance, difficulty walking, decreased ROM, decreased strength, impaired flexibility, postural  dysfunction, and pain.  ?  ?ACTIVITY LIMITATIONS cleaning, community activity, meal prep, laundry, yard work, and shopping.  ?  ?PERSONAL FACTORS Past/current experiences, Time since onset of injury/illness/exacerbation, and 1 comorbidity: DM  are also affecting patient's functional outcome.  ?  ?  ?GOALS: ?Goals reviewed with patient? Yes ?  ?SHORT TERM GOALS: ?  ?Patient will be I with initial HEP in  order to progress with therapy. ?Baseline: HEP provided at evaluation ?09/07/2021: independent with HEP ?Target date: 09/03/2021 ?Goal status: MET ?  ?2.  PT will review FOTO with patient by 3rd visit in order to understand expected progress and outcome with therapy. ?Baseline: FOTO assessed at eval ?09/07/2021: reviewed and reassessed ?Target date: 09/03/2021 ?Goal status: MET ?  ?3.  Patient will report pain with walking </= 5/10 in order to improve walking tolerance and distance ?Baseline: pain up to 8/10 ?09/07/2021: pain up to 7/10 ?Target date: 09/03/2021 ?Goal status: ONGOING ?  ?LONG TERM GOALS: ?  ?Patient will be I with final HEP to maintain progress from PT. ?Baseline: HEP provided at evaluation ?Target date: 10/01/2021 ?Goal status: INITIAL ?  ?2.  Patient will report >/= 61% status on FOTO to indicate improved functional ability. ?Baseline: 51% functional status ?Target date: 10/01/2021 ?Goal status: INITIAL ?  ?3.  Patient will demonstrate improved gross core and hip strength >/= 4-/5 MMT in order to improve walking and standing tolerance ?Baseline: patient demonstrates gross core and hip weakness of right ?Target date: 10/01/2021 ?Goal status: INITIAL ?  ?4.  Patient will be able to stand and/or walk >/= 30 minutes without needing seated rest break in order to improve community access ?Baseline: patient only able to walk 1/2 block (about 10 minutes) ?Target date: 10/01/2021 ?Goal status: INITIAL ?  ?  ?PLAN: ?PT FREQUENCY: 1x/week ?  ?PT DURATION: 8 weeks ?  ?PLANNED INTERVENTIONS: Therapeutic exercises, Therapeutic  activity, Neuromuscular re-education, Balance training, Gait training, Patient/Family education, Joint manipulation, Joint mobilization, Aquatic Therapy, Dry Needling, Electrical stimulation, Spinal manipulati

## 2021-09-14 ENCOUNTER — Encounter: Payer: Self-pay | Admitting: Physical Therapy

## 2021-09-14 ENCOUNTER — Ambulatory Visit: Payer: Medicare HMO | Admitting: Physical Therapy

## 2021-09-14 ENCOUNTER — Other Ambulatory Visit: Payer: Self-pay

## 2021-09-14 DIAGNOSIS — M6281 Muscle weakness (generalized): Secondary | ICD-10-CM

## 2021-09-14 DIAGNOSIS — G8929 Other chronic pain: Secondary | ICD-10-CM | POA: Diagnosis not present

## 2021-09-14 DIAGNOSIS — M545 Low back pain, unspecified: Secondary | ICD-10-CM | POA: Diagnosis not present

## 2021-09-20 DIAGNOSIS — M48062 Spinal stenosis, lumbar region with neurogenic claudication: Secondary | ICD-10-CM | POA: Diagnosis not present

## 2021-09-20 NOTE — Therapy (Signed)
?OUTPATIENT PHYSICAL THERAPY TREATMENT NOTE ? ? ?Patient Name: Danielle Beck ?MRN: [id number] ?DOB:[birth year], 71 y.o., female ?Today's Date: 09/21/2021 ? ?PCP: Gaynelle Arabian, MD ?REFERRING PROVIDER: Gaynelle Arabian, MD ? ? PT End of Session - 09/21/21 1047   ? ? Visit Number 7   ? Number of Visits 8   ? Date for PT Re-Evaluation 10/01/21   ? Authorization Type [employer] MCR   ? Authorization Time Period 08/08/2021 - 10/07/2021   ? Authorization - Visit Number 6   ? Authorization - Number of Visits 10   ? PT Start Time 1045   ? PT Stop Time 9532   ? PT Time Calculation (min) 42 min   ? Activity Tolerance Patient tolerated treatment well   ? Behavior During Therapy [facility] for tasks assessed/performed   ? ?  ?  ? ?  ? ? ? ? ? ? ?Past Medical History:  ?Diagnosis Date  ? CHF (congestive heart failure) ([place])   ? Diabetes mellitus without complication ([place])   ? Hyperlipidemia   ? Hypertension   ? ?Past Surgical History:  ?Procedure Laterality Date  ? CATARACT EXTRACTION    ? TUBAL LIGATION  1985  ? ?Patient Active Problem List  ? Diagnosis Date Noted  ? Moderate mitral regurgitation 04/02/2020  ? Moderate aortic stenosis 04/02/2020  ? PAF (paroxysmal atrial fibrillation) ([place]) 10/15/2019  ? Palpitations 10/15/2019  ? Leg edema 07/17/2019  ? Essential hypertension 06/27/2019  ? Chronic heart failure with preserved ejection fraction ([place]) 06/27/2019  ? Shortness of breath 03/21/2019  ? Hyponatremia 03/21/2019  ? Anemia 03/21/2019  ? Screening cholesterol level 03/21/2019  ? ? ?REFERRING PROVIDER: Phylliss Bob, MD ?  ?REFERRING DIAG: Low back pain ? ?THERAPY DIAG:  ?Chronic bilateral low back pain, unspecified whether sciatica present ? ?Muscle weakness (generalized) ? ?PERTINENT HISTORY: CHF, DM ? ?PRECAUTIONS: None ? ?SUBJECTIVE:  Patient reports HEP compliance and she feels overall that therapy has helped her. ? ?PAIN:  ?Are you having pain? Yes ?NPRS scale: 6/10 (at worst 7/10) ?Pain location: Back ?Pain orientation:  Lower  ?PAIN TYPE: Chronic ?Pain description: Intermittent,  ?Aggravating factors: Walking extra ?Relieving factors: Rubbing the leg ? ?PATIENT GOALS: Pain relief and improve walking ability ? ? ?OBJECTIVE:  ?PATIENT SURVEYS:  ?FOTO 58% functional status - 09/07/2021 (51% at evaluation) ?  ?MUSCLE LENGTH: ?Hamstring length limited on right due to right hip/low back pain ?  ?POSTURE:  ?Slight increase lumbar lordosis, rounded shoulders,  ?  ?PALPATION: ?Tenderness with palpation to glute med and max on right ?  ?LUMBAR AROM ?  ?A/PROM AROM  ?08/06/2021  ?Flexion WFL  ?Extension 50%  ?Right lateral flexion 75%  ?Left lateral flexion 75%  ?Right rotation 75%  ?Left rotation 75%  ?         Patient reports right leg pain with lumbar extension ?  ?LE MMT: ?  ?MMT Right ?08/06/2021 Left ?08/06/2021 Rt / Lt ?09/14/2021  ?Hip flexion 4- 4   ?Hip extension 3 4- 3 / 4-  ?Hip abduction 3- 4- 3 / 4-  ?Knee flexion 4 5   ?Knee extension 4 5   ?  ?LUMBAR SPECIAL TESTS:  ?Straight leg raise test: Positive ?  ?GAIT: ?Assistive device utilized: None ?Level of assistance: Complete Independence ?Comments: Decreased gait speed, slight forward trunk lean, compensated trendelenburg on right ?  ?  ?TODAY'S TREATMENT ?[facility] Adult PT Treatment:  DATE: 09/21/2021 ?Therapeutic Exercise: ?NuStep L5 x 5 min with UE/LE while taking subjective ?Supine hamstring stretch with strap 2 x 30 sec each ?LTR x 10 ?Supine piriformis stretch 2 x 30 sec each ? Hooklying clamshell with green 2 x 10 ?Bridge 2 x 10 ?Supine hip adduction ball squeeze 5" hold x10 ?SLR 2 x 10 each ?Sidelying hip abduction 2 x 10 each ?Sit to stand with hands on thighs x10 ?Seated lumbar flexion physioball roll out x10 forwards, each side-to-side ? ?  ?[facility] Adult PT Treatment:                                                DATE: 09/14/2021 ?Therapeutic Exercise: ?NuStep L5 x 5 min with UE/LE while taking subjective ?Seated hamstring stretch 2 x  20 sec each ?LTR x 10 ?Supine piriformis stretch 2 x 20 sec each ?Hooklying clamshell with green 2 x 10 ?Bridge 2 x 10 ?SLR 2 x 10 each ?Sidelying hip abduction 2 x 10 each ?Sit to stand with hands on thighs 3 x 5 ?Seated lumbar flexion physioball roll out 5 x 5 sec ?Manual: ?LAD for RLE x 5 bouts ?STM for right hip and thigh using roller in sidelying ? ? ?[facility] Adult PT Treatment:                                                DATE: 09/07/2021 ?Therapeutic Exercise: ?NuStep L5 x 5 min with UE/LE while taking subjective ?LTR x 10 ?Hooklying clamshell with green 2 x 10  ?Supine alternating march with green 2 x 10 ?Bridge 2 x 10 ?SLR 2 x 10 each ?Sidelying hip abduction 2 x 10 each ?Supine piriformis stretch 2 x 20 sec each ?Seated hamstring stretch 2 x 20 sec each ?Sit to stand with hands on thighs 2 x 10 ? ?  ?PATIENT EDUCATION:  ?Education details: HEP ?Person educated: Patient ?Education method: Explanation, Demonstration, Tactile cues, Verbal cues ?Education comprehension: verbalized understanding, returned demonstration, verbal cues required, tactile cues required, and needs further education ?  ?HOME EXERCISE PROGRAM: ?Access Code: [email or web address]     ?  ?  ?ASSESSMENT: ?CLINICAL IMPRESSION: ?Patient presents to PT reporting moderate to high levels of pain and continued numbness in her LE. She reports HEP compliance and feels that therapy has helped her pain overall. Session today focused on improving proximal hip and core strength. Patient was able to tolerate all prescribed exercises with no adverse effects. Plan to incorporate more standing exercises into next session. Patient continues to benefit from skilled PT services and should be progressed as able to improve functional independence. ? ?   ?OBJECTIVE IMPAIRMENTS Abnormal gait, decreased activity tolerance, difficulty walking, decreased ROM, decreased strength, impaired flexibility, postural dysfunction, and pain.  ?  ?ACTIVITY LIMITATIONS cleaning, community  activity, meal prep, laundry, yard work, and shopping.  ?  ?PERSONAL FACTORS Past/current experiences, Time since onset of injury/illness/exacerbation, and 1 comorbidity: DM  are also affecting patient's functional outcome.  ?  ?  ?GOALS: ?Goals reviewed with patient? Yes ?  ?SHORT TERM GOALS: ?  ?Patient will be I with initial HEP in order to progress with therapy. ?Baseline: HEP provided at evaluation ?09/07/2021: independent with HEP ?Target date: 09/03/2021 ?  Goal status: MET ?  ?2.  PT will review FOTO with patient by 3rd visit in order to understand expected progress and outcome with therapy. ?Baseline: FOTO assessed at eval ?09/07/2021: reviewed and reassessed ?Target date: 09/03/2021 ?Goal status: MET ?  ?3.  Patient will report pain with walking </= 5/10 in order to improve walking tolerance and distance ?Baseline: pain up to 8/10 ?09/07/2021: pain up to 7/10 ?Target date: 09/03/2021 ?Goal status: ONGOING ?  ?LONG TERM GOALS: ?  ?Patient will be I with final HEP to maintain progress from PT. ?Baseline: HEP provided at evaluation ?Target date: 10/01/2021 ?Goal status: INITIAL ?  ?2.  Patient will report >/= 61% status on FOTO to indicate improved functional ability. ?Baseline: 51% functional status ?Target date: 10/01/2021 ?Goal status: INITIAL ?  ?3.  Patient will demonstrate improved gross core and hip strength >/= 4-/5 MMT in order to improve walking and standing tolerance ?Baseline: patient demonstrates gross core and hip weakness of right ?Target date: 10/01/2021 ?Goal status: INITIAL ?  ?4.  Patient will be able to stand and/or walk >/= 30 minutes without needing seated rest break in order to improve community access ?Baseline: patient only able to walk 1/2 block (about 10 minutes) ?Target date: 10/01/2021 ?Goal status: INITIAL ?  ?  ?PLAN: ?PT FREQUENCY: 1x/week ?  ?PT DURATION: 8 weeks ?  ?PLANNED INTERVENTIONS: Therapeutic exercises, Therapeutic activity, Neuromuscular re-education, Balance training, Gait training,  Patient/Family education, Joint manipulation, Joint mobilization, Aquatic Therapy, Dry Needling, Electrical stimulation, Spinal manipulation, Spinal mobilization, Cryotherapy, Moist heat, Taping, and [place]

## 2021-09-21 ENCOUNTER — Ambulatory Visit: Payer: Medicare HMO

## 2021-09-21 DIAGNOSIS — M545 Low back pain, unspecified: Secondary | ICD-10-CM | POA: Diagnosis not present

## 2021-09-21 DIAGNOSIS — M6281 Muscle weakness (generalized): Secondary | ICD-10-CM

## 2021-09-21 DIAGNOSIS — G8929 Other chronic pain: Secondary | ICD-10-CM | POA: Diagnosis not present

## 2021-09-27 ENCOUNTER — Ambulatory Visit: Payer: Medicare HMO | Admitting: Cardiology

## 2021-09-27 ENCOUNTER — Encounter: Payer: Self-pay | Admitting: Cardiology

## 2021-09-27 VITALS — BP 123/64 | HR 76 | Temp 98.0°F | Resp 16 | Ht 63.0 in | Wt 185.0 lb

## 2021-09-27 DIAGNOSIS — I10 Essential (primary) hypertension: Secondary | ICD-10-CM

## 2021-09-27 DIAGNOSIS — I5032 Chronic diastolic (congestive) heart failure: Secondary | ICD-10-CM

## 2021-09-27 DIAGNOSIS — I350 Nonrheumatic aortic (valve) stenosis: Secondary | ICD-10-CM | POA: Diagnosis not present

## 2021-09-27 DIAGNOSIS — I480 Paroxysmal atrial fibrillation: Secondary | ICD-10-CM

## 2021-09-27 MED ORDER — RIVAROXABAN 15 MG PO TABS: 15.0000 mg | ORAL_TABLET | Freq: Every day | ORAL | 3 refills | Status: DC

## 2021-09-27 NOTE — Therapy (Signed)
?OUTPATIENT PHYSICAL THERAPY TREATMENT NOTE ? ?DISCHARGE ? ?Patient Name: Danielle Beck ?MRN: [id number] ?DOB:Jul 18, 1950, 71 y.o., female ?Today's Date: 09/30/2021 ? ?PCP: Gaynelle Arabian, MD ?REFERRING PROVIDER: Phylliss Bob, MD ? ? PT End of Session - 09/30/21 1050   ? ? Visit Number 8   ? Number of Visits 8   ? Date for PT Re-Evaluation 10/01/21   ? Authorization Type [employer] MCR   ? Authorization Time Period 08/08/2021 - 10/07/2021   ? Authorization - Visit Number 7   ? Authorization - Number of Visits 10   ? PT Start Time 1045   ? PT Stop Time 1130   ? PT Time Calculation (min) 45 min   ? Activity Tolerance Patient tolerated treatment well   ? Behavior During Therapy [facility] for tasks assessed/performed   ? ?  ?  ? ?  ? ? ? ? ? ? ? ?Past Medical History:  ?Diagnosis Date  ? CHF (congestive heart failure) ([place])   ? Diabetes mellitus without complication ([place])   ? Hyperlipidemia   ? Hypertension   ? ?Past Surgical History:  ?Procedure Laterality Date  ? CATARACT EXTRACTION    ? TUBAL LIGATION  1985  ? ?Patient Active Problem List  ? Diagnosis Date Noted  ? Moderate mitral regurgitation 04/02/2020  ? Nonrheumatic aortic valve stenosis 04/02/2020  ? PAF (paroxysmal atrial fibrillation) ([place]) 10/15/2019  ? Palpitations 10/15/2019  ? Leg edema 07/17/2019  ? Essential hypertension 06/27/2019  ? Chronic heart failure with preserved ejection fraction ([place]) 06/27/2019  ? Shortness of breath 03/21/2019  ? Hyponatremia 03/21/2019  ? Anemia 03/21/2019  ? Screening cholesterol level 03/21/2019  ? ? ?REFERRING PROVIDER: Phylliss Bob, MD ?  ?REFERRING DIAG: Low back pain ? ?THERAPY DIAG:  ?Chronic bilateral low back pain, unspecified whether sciatica present ? ?Muscle weakness (generalized) ? ?PERTINENT HISTORY: CHF, DM ? ?PRECAUTIONS: None ? ?SUBJECTIVE:  Patient reports she is doing ok, she is feeling about the same.  ? ?PAIN:  ?Are you having pain? Yes ?NPRS scale: 5/10 ?Pain location: Lower back, right leg ?PAIN TYPE:  Chronic ?Pain description: Intermittent,  ?Aggravating factors: Walking extra ?Relieving factors: Rubbing the leg ? ?PATIENT GOALS: Pain relief and improve walking ability ? ? ?OBJECTIVE:  ?PATIENT SURVEYS:  ?FOTO 50% functional status - 09/30/2021 ?58% functional status - 09/07/2021 ?51% at evaluation ?  ?MUSCLE LENGTH: ?Hamstring length limited on right due to right hip/low back pain ?  ?POSTURE:  ?Slight increase lumbar lordosis, rounded shoulders,  ?  ?PALPATION: ?Tenderness with palpation to glute med and max on right ?  ?LUMBAR AROM ?  ?A/PROM AROM  ?08/06/2021  ?Flexion WFL  ?Extension 50%  ?Right lateral flexion 75%  ?Left lateral flexion 75%  ?Right rotation 75%  ?Left rotation 75%  ?         Patient reports right leg pain with lumbar extension ?  ?LE MMT: ?  ?MMT Right ?08/06/2021 Left ?08/06/2021 Rt / Lt ?09/14/2021 Rt / Lt ?09/30/2021  ?Hip flexion 4- 4    ?Hip extension 3 4- 3 / 4- 3+ / 4-  ?Hip abduction 3- 4- 3 / 4- 3+ / 4-  ?Knee flexion '4 5  4 ' / 5  ?Knee extension '4 5  5 ' / 5  ?  ?LUMBAR SPECIAL TESTS:  ?Straight leg raise test: Positive ?  ?GAIT: ?Assistive device utilized: None ?Level of assistance: Complete Independence ?Comments: Decreased gait speed, slight forward trunk lean, compensated trendelenburg on right ?  ?  ?  TODAY'S TREATMENT ?[facility] Adult PT Treatment:                                                DATE: 09/30/2021 ?Therapeutic Exercise: ?NuStep L5 x 5 min with UE/LE while taking subjective ?Seated hamstring stretch 2 x 20 sec each ?Seated sciatic nerve floss ankle DF/PF x 10 ?LTR x 10 ?Supine piriformis stretch 2 x 20 sec each ? Hooklying clamshell with green 2 x 10 ?Supine march with green 2 x 20 ?Bridge 2 x 10 ?SLR 2 x 10 each ?Sidelying hip abduction 2 x 10 each ?Sit to stand with hands on thighs x 10 ?Seated lumbar flexion physioball roll out x 5 forwards ?Standing lumbar flexion stretch x 5 ? ? ?[facility] Adult PT Treatment:                                                DATE:  09/21/2021 ?Therapeutic Exercise: ?NuStep L5 x 5 min with UE/LE while taking subjective ?Supine hamstring stretch with strap 2 x 30 sec each ?LTR x 10 ?Supine piriformis stretch 2 x 30 sec each ? Hooklying clamshell with green 2 x 10 ?Bridge 2 x 10 ?Supine hip adduction ball squeeze 5" hold x10 ?SLR 2 x 10 each ?Sidelying hip abduction 2 x 10 each ?Sit to stand with hands on thighs x10 ?Seated lumbar flexion physioball roll out x10 forwards, each side-to-side ? ?[facility] Adult PT Treatment:                                                DATE: 09/14/2021 ?Therapeutic Exercise: ?NuStep L5 x 5 min with UE/LE while taking subjective ?Seated hamstring stretch 2 x 20 sec each ?LTR x 10 ?Supine piriformis stretch 2 x 20 sec each ?Hooklying clamshell with green 2 x 10 ?Bridge 2 x 10 ?SLR 2 x 10 each ?Sidelying hip abduction 2 x 10 each ?Sit to stand with hands on thighs 3 x 5 ?Seated lumbar flexion physioball roll out 5 x 5 sec ?Manual: ?LAD for RLE x 5 bouts ?STM for right hip and thigh using roller in sidelying ? ?[facility] Adult PT Treatment:                                                DATE: 09/07/2021 ?Therapeutic Exercise: ?NuStep L5 x 5 min with UE/LE while taking subjective ?LTR x 10 ?Hooklying clamshell with green 2 x 10  ?Supine alternating march with green 2 x 10 ?Bridge 2 x 10 ?SLR 2 x 10 each ?Sidelying hip abduction 2 x 10 each ?Supine piriformis stretch 2 x 20 sec each ?Seated hamstring stretch 2 x 20 sec each ?Sit to stand with hands on thighs 2 x 10 ? ?PATIENT EDUCATION:  ?Education details: POC discharge, FOTO, progress toward goals, HEP ?Person educated: Patient and husband ?Education method: Explanation ?Education comprehension: Verbalized understanding, returned demonstration ?  ?HOME EXERCISE PROGRAM: ?Access Code: [email or web address]     ?  ?  ?  ASSESSMENT: ?CLINICAL IMPRESSION: ?Patient tolerated therapy well with no adverse effects. Overall she has progressed in therapy but continues to report pain and limitation with  activity. Her symptoms continue to seem radicular in nature with right sided symptoms. Currently she does not seem to be progressing with therapy and is independent with her HEP so patient will be discharged from PT due to lack of progress. ? ?   ?OBJECTIVE IMPAIRMENTS Abnormal gait, decreased activity tolerance, difficulty walking, decreased ROM, decreased strength, impaired flexibility, postural dysfunction, and pain.  ?  ?ACTIVITY LIMITATIONS cleaning, community activity, meal prep, laundry, yard work, and shopping.  ?  ?PERSONAL FACTORS Past/current experiences, Time since onset of injury/illness/exacerbation, and 1 comorbidity: DM  are also affecting patient's functional outcome.  ?  ?  ?GOALS: ?Goals reviewed with patient? Yes ?  ?SHORT TERM GOALS: ?  ?Patient will be I with initial HEP in order to progress with therapy. ?Baseline: HEP provided at evaluation ?09/07/2021: independent with HEP ?Target date: 09/03/2021 ?Goal status: MET ?  ?2.  PT will review FOTO with patient by 3rd visit in order to understand expected progress and outcome with therapy. ?Baseline: FOTO assessed at eval ?09/07/2021: reviewed and reassessed ?Target date: 09/03/2021 ?Goal status: MET ?  ?3.  Patient will report pain with walking </= 5/10 in order to improve walking tolerance and distance ?Baseline: pain up to 8/10 ?09/07/2021: pain up to 7/10 ?09/30/2021: 5/10 ?Target date: 09/03/2021 ?Goal status: MET ?  ?LONG TERM GOALS: ?  ?Patient will be I with final HEP to maintain progress from PT. ?Baseline: HEP provided at evaluation ?09/30/2021: independent with HEP ?Target date: 10/01/2021 ?Goal status: MET ?  ?2.  Patient will report >/= 61% status on FOTO to indicate improved functional ability. ?Baseline: 51% functional status ?09/30/2021: 50% ?Target date: 10/01/2021 ?Goal status: NOT MET ?  ?3.  Patient will demonstrate improved gross core and hip strength >/= 4-/5 MMT in order to improve walking and standing tolerance ?Baseline: patient demonstrates  gross core and hip weakness of right ?09/30/2021: grossly 3+/5 MMT ?Target date: 10/01/2021 ?Goal status: NOT MET ?  ?4.  Patient will be able to stand and/or walk >/= 30 minutes without needing seated rest break in order to impr

## 2021-09-27 NOTE — Progress Notes (Signed)
? ? ? ?Patient referred by Gaynelle Arabian, MD for shortness of breath ? ?Subjective:  ? ?Danielle Beck, female    DOB: 05/06/1951, 71 y.o.   MRN: [id number] ? ?Chief Complaint  ?Patient presents with  ? Congestive Heart Failure  ? Atrial Fibrillation  ? Follow-up  ?  6 month  ? ?HPI ? ?71 y.o. female with hypertension, diabetes mellitus, HFpEF, paroxysmal Afib/flutter, PSVT ? ?Patient is doing well from cardiac standpoint. Her biggest problem currently is spinal stenosis, for which she is getting injections with improvement in pain and radiculopathy symptoms.  ? ?Current Outpatient Medications:  ?  amLODipine (NORVASC) 10 MG tablet, Take 10 mg by mouth daily. , Disp: , Rfl:  ?  atorvastatin (LIPITOR) 10 MG tablet, Take 10 mg by mouth daily., Disp: , Rfl:  ?  calcium-vitamin D (OSCAL WITH D) 250-125 MG-UNIT tablet, Take 1 tablet by mouth 2 (two) times daily. , Disp: , Rfl:  ?  dapagliflozin propanediol (FARXIGA) 10 MG TABS tablet, Take 10 mg by mouth daily., Disp: , Rfl:  ?  dorzolamide (TRUSOPT) 2 % ophthalmic solution, Place 1 drop into the left eye 2 (two) times daily., Disp: , Rfl:  ?  Ferrous Sulfate (IRON) 325 (65 Fe) MG TABS, 1 tablet, Disp: , Rfl:  ?  gabapentin (NEURONTIN) 400 MG capsule, Take 400 mg by mouth 3 (three) times daily., Disp: , Rfl:  ?  LANTUS SOLOSTAR 100 UNIT/ML Solostar Pen, Inject 30 Units into the skin daily., Disp: , Rfl:  ?  latanoprost (XALATAN) 0.005 % ophthalmic solution, Place 1 drop into both eyes at bedtime. , Disp: , Rfl:  ?  losartan (COZAAR) 100 MG tablet, TAKE 1 TABLET (100 MG TOTAL) BY MOUTH DAILY., Disp: 90 tablet, Rfl: 3 ?  metFORMIN (GLUCOPHAGE) 1000 MG tablet, Take 1 tablet by mouth in the morning and at bedtime., Disp: , Rfl:  ?  metoprolol tartrate (LOPRESSOR) 25 MG tablet, TAKE 1 TABLET TWICE DAILY, Disp: 180 tablet, Rfl: 3 ?  pyridOXINE (VITAMIN B-6) 100 MG tablet, Take 100 mg by mouth daily., Disp: , Rfl:  ?  Rivaroxaban (XARELTO) 15 MG TABS tablet, Take 1 tablet  (15 mg total) by mouth daily with supper., Disp: 90 tablet, Rfl: 3 ?  thiamine (VITAMIN B-1) 100 MG tablet, Take 100 mg by mouth daily., Disp: , Rfl:  ?  timolol (TIMOPTIC) 0.5 % ophthalmic solution, Place 1 drop into the left eye 2 (two) times daily., Disp: , Rfl:  ?  torsemide (DEMADEX) 20 MG tablet, TAKE 1 TABLET EVERY DAY AND 1 ADDITIONAL TABLET IF NEEDED FOR LEG SWELLING AS DIRECTED, Disp: 180 tablet, Rfl: 2 ?  vitamin B-12 (CYANOCOBALAMIN) 100 MCG tablet, Take 100 mcg by mouth daily., Disp: , Rfl:  ? ?Cardiovascular studies: ? ?EKG 09/27/2021: ?Sinus rhythm 77 bpm ?LAFB ?Left ventricular hypertrophy ?Old anteroseptal infarct ? ?Echocardiogram 03/31/2021:  ?Normal LV systolic function with visual EF 60-65%. Left ventricle cavity  ?is normal in size. Mild left ventricular hypertrophy. Doppler evidence of  ?grade II diastolic dysfunction, elevated LAP. Normal global wall motion.  ?Left atrial cavity is moderately dilated.  ?MIld to moderate aortic stenosis. Vmax 2.4 m/sec, mean PG 18 mmHg, AVA 1.3  ?cm2, DI 0.4.  ?Moderate (Grade II) mitral regurgitation.  ?Mild tricuspid regurgitation. No evidence of pulmonary hypertension.  ?IVC is normal with a respiratory response of <50%.  ?Compared to study 03/30/2020: no significant change.  ? ?Real time outpatient cardiac telemetry 09/23/2019: ?Monitoring period 360 hr 41  min ?HR 56-170 bpm. Avg HR 83 bpm ?Rare ventricular ectopy (PVC/pair) ?Frequent PAC's and occasional atrial bigeminy in sinus rhythm.  ?Multiple runs of SVT at 150 bpm with associated T wave inversions, followed by coarse Afib/atrial flutter episodes ?SVT burden <1%, Afib burden <1% ?No symptoms associated with above arrhythmia. ?Patient activated events correlate with sinus rhythm. ?No VT/high grade AV block, sinus pause >3sec noted. ? ?ABI 09/12/2019:  ?This exam reveals normal perfusion of the lower extremity (ABI 1.07 bilateral).   ?Triphasic (normal) waveform noted at the level of the ankle. ? ?Lexiscan  Tetrofosmin Stress Test  03/24/2019: ?Resting EKG normal sinus rhythm, stress EKG nondiagnostic due to pharmacologic stress test using Lexiscan.  Status symptoms included dyspnea and dizziness. ?There is a prominent diaphragmatic attenuation artifact in the inferior wall. without ischemia or scar. Left ventricular ejection fraction is  58% with normal wall motion. Low risk study. ?No previous exam available for comparison. ? ?CTA chest 03/05/2019: ?No evidence of pulmonary embolism. ?Mild vascular congestion and edema with small pleural effusions. ?Aortic Atherosclerosis. ?  ?Recent labs: ?06/20/2021: ?Glucose 97, BUN/Cr 45/1.41. EGFR 40. Na/K 138/4.7. Rest of the CMP normal ?H/H 10/31. MCV 92. Platelets 269 ?HbA1C 6.0% ? ?12/01/2020: ?Glucose 143 ?HbA1C 6.6% ?Chol 182, TG 153, HDL 61, LDL 95 ? ?Review of Systems  ?Cardiovascular:  Positive for dyspnea on exertion (Improved) and leg swelling. Negative for chest pain, palpitations and syncope.  ? ?   ?Vitals:  ? 09/27/21 0952 09/27/21 0959  ?BP: (!) 117/57 123/64  ?Pulse: 76 76  ?Resp: 16   ?Temp: 98 ?F (36.7 ?C)   ?SpO2:  97%  ? ? ? ?Objective:  ? Physical Exam ?Vitals and nursing note reviewed.  ?Constitutional:   ?   General: She is not in acute distress. ?Neck:  ?   Vascular: No JVD.  ?Cardiovascular:  ?   Rate and Rhythm: Normal rate and regular rhythm.  ?   Heart sounds: Murmur heard.  ?Harsh midsystolic murmur is present with a grade of 3/6 at the upper right sternal border radiating to the neck.  ?Pulmonary:  ?   Effort: Pulmonary effort is normal.  ?   Breath sounds: Normal breath sounds. No wheezing or rales.  ?Musculoskeletal:  ?   Right lower leg: Edema (Trace) present.  ?   Left lower leg: Edema (Trace) present.  ?  ? ?  ICD-10-CM   ?1. PAF (paroxysmal atrial fibrillation) (HCC)  I48.0 EKG 12-Lead  ?  Rivaroxaban (XARELTO) 15 MG TABS tablet  ?  ?2. Chronic heart failure with preserved ejection fraction (HCC)  I50.32   ?  ?3. Nonrheumatic aortic valve  stenosis  I35.0   ?  ?4. Essential hypertension  I10   ?  ? ?Meds ordered this encounter  ?Medications  ? Rivaroxaban (XARELTO) 15 MG TABS tablet  ?  Sig: Take 1 tablet (15 mg total) by mouth daily with supper.  ?  Dispense:  90 tablet  ?  Refill:  3  ? ? ?   ?Assessment & Recommendations:  ? ?71 y.o. female with hypertension, diabetes mellitus, HFpEF, mod AS, mod MR,  paroxysmal Afib/flutter, PSVT ? ?Mod AS, mod MR: ?Stable. Repeat echocardiogram in 03/2022. ? ?Paroxysmal Afib/flutter + PSVT: ?Improved. ?CHA2DS2VASc score 4, annual stroke risk 5%. Continue Xarelto 15 mg daily, given GFR <50. ?Stable normocytic anemia ? ?Okay to hold Xarelto up to 3 days for spinal injections or dentist appts.  ? ?HFpEF: ?Aggressive blood pressure control, low-salt diet,  and diuretic therapy. ?Currently on torsemide 1.5 tabs of 20 mg daily ? ?Hypertension: ?Continue current antihypertensive therapy. ? ?F/u in  6 months ? ?Nigel Mormon, MD ?[facility] ?Pager: [phone] ?Office: [phone] ?If no answer Cell [phone] ?   ?

## 2021-09-28 ENCOUNTER — Ambulatory Visit: Payer: Medicare HMO | Admitting: Cardiology

## 2021-09-30 ENCOUNTER — Ambulatory Visit: Payer: Medicare HMO | Attending: Orthopedic Surgery | Admitting: Physical Therapy

## 2021-09-30 ENCOUNTER — Encounter: Payer: Self-pay | Admitting: Physical Therapy

## 2021-09-30 ENCOUNTER — Other Ambulatory Visit: Payer: Self-pay

## 2021-09-30 DIAGNOSIS — M6281 Muscle weakness (generalized): Secondary | ICD-10-CM | POA: Insufficient documentation

## 2021-09-30 DIAGNOSIS — G8929 Other chronic pain: Secondary | ICD-10-CM | POA: Insufficient documentation

## 2021-09-30 DIAGNOSIS — M545 Low back pain, unspecified: Secondary | ICD-10-CM | POA: Diagnosis not present

## 2021-09-30 NOTE — Patient Instructions (Signed)
Access Code: [email or web address] ?URL: [email or web address] ?Date: 09/30/2021 ?Prepared by: Hilda Blades ? ?Exercises ?- Seated Hamstring Stretch  - 1 x daily - 2 reps - 20 seconds hold ?- Seated Ankle Pumps  - 1 x daily - 2 sets - 10 reps ?- Supine Lower Trunk Rotation  - 1 x daily - 5 reps - 5 seconds hold ?- Supine Piriformis Stretch with Foot on Ground  - 1 x daily - 2 reps - 20 seconds hold ?- Small Range Straight Leg Raise  - 1 x daily - 2 sets - 10 reps ?- Bridge  - 1 x daily - 2 sets - 10 reps ?- Hooklying Clamshell with Resistance  - 1 x daily - 2 sets - 10 reps ?- Supine March with Resistance Band  - 1 x daily - 2 sets - 10 reps ?- Sidelying Hip Abduction  - 1 x daily - 2 sets - 10 reps ?- Sit to Stand  - 1 x daily - 2 sets - 10 reps ?- Standing Lumbar Spine Flexion Stretch Counter  - 1 x daily - 10 reps - 5 seconds hold ?

## 2021-10-06 DIAGNOSIS — E113512 Type 2 diabetes mellitus with proliferative diabetic retinopathy with macular edema, left eye: Secondary | ICD-10-CM | POA: Diagnosis not present

## 2021-10-25 ENCOUNTER — Other Ambulatory Visit: Payer: Self-pay | Admitting: Cardiology

## 2021-11-03 DIAGNOSIS — E113511 Type 2 diabetes mellitus with proliferative diabetic retinopathy with macular edema, right eye: Secondary | ICD-10-CM | POA: Diagnosis not present

## 2021-11-08 DIAGNOSIS — H5213 Myopia, bilateral: Secondary | ICD-10-CM | POA: Diagnosis not present

## 2021-11-08 DIAGNOSIS — H40053 Ocular hypertension, bilateral: Secondary | ICD-10-CM | POA: Diagnosis not present

## 2021-11-08 DIAGNOSIS — E113313 Type 2 diabetes mellitus with moderate nonproliferative diabetic retinopathy with macular edema, bilateral: Secondary | ICD-10-CM | POA: Diagnosis not present

## 2021-11-08 DIAGNOSIS — Z794 Long term (current) use of insulin: Secondary | ICD-10-CM | POA: Diagnosis not present

## 2021-11-08 DIAGNOSIS — Z961 Presence of intraocular lens: Secondary | ICD-10-CM | POA: Diagnosis not present

## 2021-11-15 ENCOUNTER — Other Ambulatory Visit: Payer: Self-pay | Admitting: Cardiology

## 2021-11-15 ENCOUNTER — Other Ambulatory Visit: Payer: Self-pay | Admitting: Orthopedic Surgery

## 2021-11-15 DIAGNOSIS — M5416 Radiculopathy, lumbar region: Secondary | ICD-10-CM | POA: Diagnosis not present

## 2021-11-15 DIAGNOSIS — M545 Other chronic pain: Secondary | ICD-10-CM

## 2021-11-18 DIAGNOSIS — H52223 Regular astigmatism, bilateral: Secondary | ICD-10-CM | POA: Diagnosis not present

## 2021-11-18 DIAGNOSIS — H524 Presbyopia: Secondary | ICD-10-CM | POA: Diagnosis not present

## 2021-11-22 ENCOUNTER — Ambulatory Visit
Admission: RE | Admit: 2021-11-22 | Discharge: 2021-11-22 | Disposition: A | Discharge status: Home / Self Care | Payer: Medicare HMO | Source: Ambulatory Visit | Attending: Orthopedic Surgery | Admitting: Orthopedic Surgery

## 2021-11-22 DIAGNOSIS — M4727 Other spondylosis with radiculopathy, lumbosacral region: Secondary | ICD-10-CM | POA: Diagnosis not present

## 2021-11-22 DIAGNOSIS — M48061 Spinal stenosis, lumbar region without neurogenic claudication: Secondary | ICD-10-CM | POA: Diagnosis not present

## 2021-11-22 DIAGNOSIS — G8929 Other chronic pain: Secondary | ICD-10-CM

## 2021-11-22 MED ORDER — IOPAMIDOL (ISOVUE-M 200) INJECTION 41%
1.0000 mL | Freq: Once | INTRAMUSCULAR | Status: AC
  Administered 2021-11-22: 1 mL via EPIDURAL

## 2021-11-22 MED ORDER — METHYLPREDNISOLONE ACETATE 40 MG/ML INJ SUSP (RADIOLOG
80.0000 mg | Freq: Once | INTRAMUSCULAR | Status: AC
  Administered 2021-11-22: 80 mg via EPIDURAL

## 2021-12-05 DIAGNOSIS — E113512 Type 2 diabetes mellitus with proliferative diabetic retinopathy with macular edema, left eye: Secondary | ICD-10-CM | POA: Diagnosis not present

## 2021-12-06 DIAGNOSIS — N1832 Chronic kidney disease, stage 3b: Secondary | ICD-10-CM | POA: Diagnosis not present

## 2021-12-06 DIAGNOSIS — Z1331 Encounter for screening for depression: Secondary | ICD-10-CM | POA: Diagnosis not present

## 2021-12-06 DIAGNOSIS — E7800 Pure hypercholesterolemia, unspecified: Secondary | ICD-10-CM | POA: Diagnosis not present

## 2021-12-06 DIAGNOSIS — D638 Anemia in other chronic diseases classified elsewhere: Secondary | ICD-10-CM | POA: Diagnosis not present

## 2021-12-06 DIAGNOSIS — I480 Paroxysmal atrial fibrillation: Secondary | ICD-10-CM | POA: Diagnosis not present

## 2021-12-06 DIAGNOSIS — I700 Atherosclerosis of aorta: Secondary | ICD-10-CM | POA: Diagnosis not present

## 2021-12-06 DIAGNOSIS — I10 Essential (primary) hypertension: Secondary | ICD-10-CM | POA: Diagnosis not present

## 2021-12-06 DIAGNOSIS — Z0000 Encounter for general adult medical examination without abnormal findings: Secondary | ICD-10-CM | POA: Diagnosis not present

## 2021-12-06 DIAGNOSIS — E1139 Type 2 diabetes mellitus with other diabetic ophthalmic complication: Secondary | ICD-10-CM | POA: Diagnosis not present

## 2021-12-06 DIAGNOSIS — D6869 Other thrombophilia: Secondary | ICD-10-CM | POA: Diagnosis not present

## 2021-12-06 DIAGNOSIS — I5032 Chronic diastolic (congestive) heart failure: Secondary | ICD-10-CM | POA: Diagnosis not present

## 2021-12-08 ENCOUNTER — Other Ambulatory Visit: Payer: Self-pay | Admitting: Family Medicine

## 2021-12-08 DIAGNOSIS — Z1231 Encounter for screening mammogram for malignant neoplasm of breast: Secondary | ICD-10-CM

## 2021-12-16 ENCOUNTER — Ambulatory Visit
Admission: RE | Admit: 2021-12-16 | Discharge: 2021-12-16 | Disposition: A | Discharge status: Home / Self Care | Payer: Medicare HMO | Source: Ambulatory Visit | Attending: Family Medicine | Admitting: Family Medicine

## 2021-12-16 DIAGNOSIS — Z1231 Encounter for screening mammogram for malignant neoplasm of breast: Secondary | ICD-10-CM

## 2021-12-22 DIAGNOSIS — I509 Heart failure, unspecified: Secondary | ICD-10-CM | POA: Diagnosis not present

## 2021-12-22 DIAGNOSIS — E559 Vitamin D deficiency, unspecified: Secondary | ICD-10-CM | POA: Diagnosis not present

## 2021-12-22 DIAGNOSIS — I129 Hypertensive chronic kidney disease with stage 1 through stage 4 chronic kidney disease, or unspecified chronic kidney disease: Secondary | ICD-10-CM | POA: Diagnosis not present

## 2021-12-22 DIAGNOSIS — R809 Proteinuria, unspecified: Secondary | ICD-10-CM | POA: Diagnosis not present

## 2021-12-22 DIAGNOSIS — E1122 Type 2 diabetes mellitus with diabetic chronic kidney disease: Secondary | ICD-10-CM | POA: Diagnosis not present

## 2021-12-22 DIAGNOSIS — E871 Hypo-osmolality and hyponatremia: Secondary | ICD-10-CM | POA: Diagnosis not present

## 2021-12-22 DIAGNOSIS — N1832 Chronic kidney disease, stage 3b: Secondary | ICD-10-CM | POA: Diagnosis not present

## 2021-12-22 DIAGNOSIS — D631 Anemia in chronic kidney disease: Secondary | ICD-10-CM | POA: Diagnosis not present

## 2021-12-26 ENCOUNTER — Other Ambulatory Visit: Payer: Self-pay | Admitting: Orthopedic Surgery

## 2021-12-26 DIAGNOSIS — M48062 Spinal stenosis, lumbar region with neurogenic claudication: Secondary | ICD-10-CM | POA: Diagnosis not present

## 2021-12-26 DIAGNOSIS — M545 Low back pain, unspecified: Secondary | ICD-10-CM

## 2022-01-05 DIAGNOSIS — E113511 Type 2 diabetes mellitus with proliferative diabetic retinopathy with macular edema, right eye: Secondary | ICD-10-CM | POA: Diagnosis not present

## 2022-01-12 ENCOUNTER — Ambulatory Visit
Admission: RE | Admit: 2022-01-12 | Discharge: 2022-01-12 | Disposition: A | Discharge status: Home / Self Care | Payer: Medicare HMO | Source: Ambulatory Visit | Attending: Orthopedic Surgery | Admitting: Orthopedic Surgery

## 2022-01-12 DIAGNOSIS — M545 Low back pain, unspecified: Secondary | ICD-10-CM

## 2022-01-12 DIAGNOSIS — M4727 Other spondylosis with radiculopathy, lumbosacral region: Secondary | ICD-10-CM | POA: Diagnosis not present

## 2022-01-12 DIAGNOSIS — M48061 Spinal stenosis, lumbar region without neurogenic claudication: Secondary | ICD-10-CM | POA: Diagnosis not present

## 2022-01-12 MED ORDER — METHYLPREDNISOLONE ACETATE 40 MG/ML INJ SUSP (RADIOLOG
80.0000 mg | Freq: Once | INTRAMUSCULAR | Status: AC
  Administered 2022-01-12: 80 mg via EPIDURAL

## 2022-01-12 MED ORDER — IOPAMIDOL (ISOVUE-M 200) INJECTION 41%
1.0000 mL | Freq: Once | INTRAMUSCULAR | Status: AC
  Administered 2022-01-12: 1 mL via EPIDURAL

## 2022-01-12 NOTE — Discharge Instructions (Signed)

## 2022-02-09 DIAGNOSIS — E113512 Type 2 diabetes mellitus with proliferative diabetic retinopathy with macular edema, left eye: Secondary | ICD-10-CM | POA: Diagnosis not present

## 2022-03-09 DIAGNOSIS — E113511 Type 2 diabetes mellitus with proliferative diabetic retinopathy with macular edema, right eye: Secondary | ICD-10-CM | POA: Diagnosis not present

## 2022-03-30 ENCOUNTER — Other Ambulatory Visit: Payer: Medicare HMO

## 2022-04-06 ENCOUNTER — Ambulatory Visit: Payer: Medicare HMO | Admitting: Cardiology

## 2022-04-06 ENCOUNTER — Ambulatory Visit: Payer: Medicare HMO

## 2022-04-06 DIAGNOSIS — I350 Nonrheumatic aortic (valve) stenosis: Secondary | ICD-10-CM | POA: Diagnosis not present

## 2022-04-10 DIAGNOSIS — M48062 Spinal stenosis, lumbar region with neurogenic claudication: Secondary | ICD-10-CM | POA: Diagnosis not present

## 2022-04-12 DIAGNOSIS — E113512 Type 2 diabetes mellitus with proliferative diabetic retinopathy with macular edema, left eye: Secondary | ICD-10-CM | POA: Diagnosis not present

## 2022-04-17 ENCOUNTER — Ambulatory Visit: Payer: Medicare HMO | Admitting: Cardiology

## 2022-04-17 ENCOUNTER — Encounter: Payer: Self-pay | Admitting: Cardiology

## 2022-04-17 VITALS — BP 130/64 | HR 80 | Resp 16 | Ht 63.0 in | Wt 190.0 lb

## 2022-04-17 DIAGNOSIS — R0609 Other forms of dyspnea: Secondary | ICD-10-CM | POA: Diagnosis not present

## 2022-04-17 DIAGNOSIS — I209 Angina pectoris, unspecified: Secondary | ICD-10-CM | POA: Insufficient documentation

## 2022-04-17 DIAGNOSIS — I350 Nonrheumatic aortic (valve) stenosis: Secondary | ICD-10-CM

## 2022-04-17 DIAGNOSIS — I480 Paroxysmal atrial fibrillation: Secondary | ICD-10-CM

## 2022-04-17 DIAGNOSIS — R600 Localized edema: Secondary | ICD-10-CM | POA: Diagnosis not present

## 2022-04-17 DIAGNOSIS — I5032 Chronic diastolic (congestive) heart failure: Secondary | ICD-10-CM | POA: Diagnosis not present

## 2022-04-17 MED ORDER — TORSEMIDE 20 MG PO TABS: 20.0000 mg | ORAL_TABLET | Freq: Two times a day (BID) | ORAL | 2 refills | Status: DC

## 2022-04-17 MED ORDER — NITROGLYCERIN 0.4 MG SL SUBL: 0.4000 mg | SUBLINGUAL_TABLET | SUBLINGUAL | 3 refills | Status: DC | PRN

## 2022-04-17 NOTE — Progress Notes (Signed)
Patient referred by Gaynelle Arabian, MD for shortness of breath  Subjective:   Danielle Beck, female    DOB: [birth year], 71 y.o.   MRN: [id number]  Chief Complaint  Patient presents with   Atrial Fibrillation   Follow-up   Results   HPI  71 y.o. female with hypertension, diabetes mellitus, HFpEF, paroxysmal Afib/flutter, PSVT  Patient is here today with her husband. She has had some recent exertional chest pain and dyspnea symptoms. Reviewed recent test results with the patient, details below.     Current Outpatient Medications:    amLODipine (NORVASC) 10 MG tablet, Take 10 mg by mouth daily. , Disp: , Rfl:    atorvastatin (LIPITOR) 10 MG tablet, Take 10 mg by mouth daily., Disp: , Rfl:    calcium-vitamin D (OSCAL WITH D) 250-125 MG-UNIT tablet, Take 1 tablet by mouth 2 (two) times daily. , Disp: , Rfl:    dapagliflozin propanediol (FARXIGA) 10 MG TABS tablet, Take 10 mg by mouth daily., Disp: , Rfl:    dorzolamide (TRUSOPT) 2 % ophthalmic solution, Place 1 drop into the left eye 2 (two) times daily., Disp: , Rfl:    Ferrous Sulfate (IRON) 325 (65 Fe) MG TABS, 1 tablet, Disp: , Rfl:    gabapentin (NEURONTIN) 400 MG capsule, Take 400 mg by mouth 3 (three) times daily., Disp: , Rfl:    LANTUS SOLOSTAR 100 UNIT/ML Solostar Pen, Inject 30 Units into the skin daily., Disp: , Rfl:    latanoprost (XALATAN) 0.005 % ophthalmic solution, Place 1 drop into both eyes at bedtime. , Disp: , Rfl:    losartan (COZAAR) 100 MG tablet, TAKE 1 TABLET EVERY DAY, Disp: 90 tablet, Rfl: 3   metFORMIN (GLUCOPHAGE) 1000 MG tablet, Take 1 tablet by mouth in the morning and at bedtime., Disp: , Rfl:    metoprolol tartrate (LOPRESSOR) 25 MG tablet, TAKE 1 TABLET TWICE DAILY, Disp: 180 tablet, Rfl: 3   pyridOXINE (VITAMIN B-6) 100 MG tablet, Take 100 mg by mouth daily., Disp: , Rfl:    Rivaroxaban (XARELTO) 15 MG TABS tablet, Take 1 tablet (15 mg total) by mouth daily with supper., Disp: 90 tablet,  Rfl: 3   thiamine (VITAMIN B-1) 100 MG tablet, Take 100 mg by mouth daily., Disp: , Rfl:    timolol (TIMOPTIC) 0.5 % ophthalmic solution, Place 1 drop into the left eye 2 (two) times daily., Disp: , Rfl:    torsemide (DEMADEX) 20 MG tablet, TAKE 1 TABLET EVERY DAY AND 1 ADDITIONAL TABLET IF NEEDED FOR LEG SWELLING AS DIRECTED, Disp: 180 tablet, Rfl: 2   vitamin B-12 (CYANOCOBALAMIN) 100 MCG tablet, Take 100 mcg by mouth daily., Disp: , Rfl:   Cardiovascular studies:  Echocardiogram 04/06/2022:  Normal LV systolic function with visual EF 55-60%. Left ventricle cavity  is normal in size. Normal left ventricular wall thickness. Normal global  wall motion. Doppler evidence of grade II (pseudonormal) diastolic  dysfunction, elevated LAP. Calculated EF 56%.  Left atrial cavity is moderately dilated.  Native trileaflet aortic valve with no regurgitation. Moderate aortic  valve stenosis. AVA (VTI) measures 0.8 cm^2. AV Mean Grad measures 22  mmHg. AV Pk Vel measures 3.12 m/s.  Native mitral valve. No evidence of mitral stenosis. Moderate (Grade III)  mitral regurgitation. Mild mitral valve leaflet calcification. Moderately  restricted mitral valve leaflets.  Structurally normal tricuspid valve with no regurgitation. No evidence of  pulmonary hypertension.  Compared to 03/2021, aortic stenosis has progressed slightly.  EKG 04/17/2022: Sinus rhythm 78 bpm  Left anterior fascicular block LVH Anteroseptal infarct -age undetermined  Echocardiogram 04/06/2022:  Normal LV systolic function with visual EF 55-60%. Left ventricle cavity  is normal in size. Normal left ventricular wall thickness. Normal global  wall motion. Doppler evidence of grade II (pseudonormal) diastolic  dysfunction, elevated LAP. Left atrial cavity is moderately dilated.  Native trileaflet aortic valve with no regurgitation. Moderate aortic  valve stenosis. AVA (VTI) measures 0.8 cm^2. AV Mean Grad measures 22  mmHg. AV Pk  Vel measures 3.12 m/s.  Native mitral valve. No evidence of mitral stenosis. Moderate (Grade III)  mitral regurgitation. Mild mitral valve leaflet calcification. Moderately  restricted mitral valve leaflets.  Structurally normal tricuspid valve with no regurgitation. No evidence of  pulmonary hypertension.  Compared to 03/2021, aortic stenosis has progressed slightly.   EKG 09/27/2021: Sinus rhythm 77 bpm LAFB Left ventricular hypertrophy Old anteroseptal infarct  Echocardiogram 03/31/2021:  Normal LV systolic function with visual EF 60-65%. Left ventricle cavity  is normal in size. Mild left ventricular hypertrophy. Doppler evidence of  grade II diastolic dysfunction, elevated LAP. Normal global wall motion.  Left atrial cavity is moderately dilated.  MIld to moderate aortic stenosis. Vmax 2.4 m/sec, mean PG 18 mmHg, AVA 1.3  cm2, DI 0.4.  Moderate (Grade II) mitral regurgitation.  Mild tricuspid regurgitation. No evidence of pulmonary hypertension.  IVC is normal with a respiratory response of <50%.  Compared to study 03/30/2020: no significant change.   Real time outpatient cardiac telemetry 09/23/2019: Monitoring period 360 hr 41 min HR 56-170 bpm. Avg HR 83 bpm Rare ventricular ectopy (PVC/pair) Frequent PAC's and occasional atrial bigeminy in sinus rhythm.  Multiple runs of SVT at 150 bpm with associated T wave inversions, followed by coarse Afib/atrial flutter episodes SVT burden <1%, Afib burden <1% No symptoms associated with above arrhythmia. Patient activated events correlate with sinus rhythm. No VT/high grade AV block, sinus pause >3sec noted.  ABI 09/12/2019:  This exam reveals normal perfusion of the lower extremity (ABI 1.07 bilateral).   Triphasic (normal) waveform noted at the level of the ankle.  Lexiscan Tetrofosmin Stress Test  03/24/2019: Resting EKG normal sinus rhythm, stress EKG nondiagnostic due to pharmacologic stress test using Lexiscan.  Status  symptoms included dyspnea and dizziness. There is a prominent diaphragmatic attenuation artifact in the inferior wall. without ischemia or scar. Left ventricular ejection fraction is  58% with normal wall motion. Low risk study. No previous exam available for comparison.  CTA chest 03/05/2019: No evidence of pulmonary embolism. Mild vascular congestion and edema with small pleural effusions. Aortic Atherosclerosis.   Recent labs: 06/20/2021: Glucose 97, BUN/Cr 45/1.41. EGFR 40. Na/K 138/4.7. Rest of the CMP normal H/H 10/31. MCV 92. Platelets 269 HbA1C 6.0%  12/01/2020: Glucose 143 HbA1C 6.6% Chol 182, TG 153, HDL 61, LDL 95  Review of Systems  Cardiovascular:  Positive for chest pain, dyspnea on exertion (Improved) and leg swelling. Negative for palpitations and syncope.       Vitals:   04/17/22 1101  BP: 130/64  Pulse: 80  Resp: 16  SpO2: 95%      Objective:   Physical Exam Vitals and nursing note reviewed.  Constitutional:      General: She is not in acute distress. Neck:     Vascular: No JVD.  Cardiovascular:     Rate and Rhythm: Normal rate and regular rhythm.     Heart sounds: Murmur heard.     Harsh  midsystolic murmur is present with a grade of 3/6 at the upper right sternal border radiating to the neck.  Pulmonary:     Effort: Pulmonary effort is normal.     Breath sounds: Normal breath sounds. No wheezing or rales.  Musculoskeletal:     Right lower leg: Edema (Trace) present.     Left lower leg: Edema (Trace) present.       ICD-10-CM   1. PAF (paroxysmal atrial fibrillation) (HCC)  I48.0 EKG 12-Lead     No orders of the defined types were placed in this encounter.      Assessment & Recommendations:   71 y.o. female with hypertension, diabetes mellitus, HFpEF, mod AS, mod MR,  paroxysmal Afib/flutter, PSVT  AS, MR: At least moderate AS, MR. There is discrepancy between Vmax, mean PG, and AVA-which is <1 cm2. Her symptoms of exertional chest pain  and dyspnea are out of proportion to mod AS. If AS is not severe, her symptoms could possibly be due to CAD. We discussed TEE and LHC/RHC vs non-invasive workup. Patient and husband are in favor or the latter, which is reasonable. Will obtain Lexiscan nuclear stress test, and repeat transthoracic echocardiogram in 3 months. I will increase her torsemide from 1 tab am, 1/2 tab pm to 1 tab bid of torsemide 20 mg. Check BMP and BNP in 1 week. I will also send SL NTG prescription. I will see her back in 4 weeks to re-assess her symptoms.  In the meantime, if there is any worsening chest pain, dyspnea, or any presyncope or syncope, patient knows to contact me immediately.   Paroxysmal Afib/flutter + PSVT: Improved. CHA2DS2VASc score 4, annual stroke risk 5%. Continue Xarelto 15 mg daily, given GFR <50. Stable normocytic anemia Okay to hold Xarelto up to 3 days for spinal injections or dentist appts.   Hypertension: Continue current antihypertensive therapy.  F/u in 4 weeks    Nigel Mormon, MD Pager: [phone] Office: [phone]

## 2022-04-24 ENCOUNTER — Other Ambulatory Visit: Payer: Self-pay | Admitting: Orthopedic Surgery

## 2022-04-24 DIAGNOSIS — M545 Low back pain, unspecified: Secondary | ICD-10-CM

## 2022-04-27 DIAGNOSIS — I350 Nonrheumatic aortic (valve) stenosis: Secondary | ICD-10-CM | POA: Diagnosis not present

## 2022-04-28 ENCOUNTER — Ambulatory Visit: Payer: Medicare HMO | Admitting: Cardiology

## 2022-04-28 LAB — CBC
Hematocrit: 32.1 % — ABNORMAL LOW (ref 34.0–46.6)
Hemoglobin: 10.4 g/dL — ABNORMAL LOW (ref 11.1–15.9)
MCH: 31.4 pg (ref 26.6–33.0)
MCHC: 32.4 g/dL (ref 31.5–35.7)
MCV: 97 fL (ref 79–97)
Platelets: 260 10*3/uL (ref 150–450)
RBC: 3.31 x10E6/uL — ABNORMAL LOW (ref 3.77–5.28)
RDW: 12.5 % (ref 11.7–15.4)
WBC: 8.9 10*3/uL (ref 3.4–10.8)

## 2022-04-28 LAB — BRAIN NATRIURETIC PEPTIDE: BNP: 74.2 pg/mL (ref 0.0–100.0)

## 2022-04-28 LAB — BASIC METABOLIC PANEL
BUN/Creatinine Ratio: 28 (ref 12–28)
BUN: 42 mg/dL — ABNORMAL HIGH (ref 8–27)
CO2: 22 mmol/L (ref 20–29)
Calcium: 10 mg/dL (ref 8.7–10.3)
Chloride: 101 mmol/L (ref 96–106)
Creatinine, Ser: 1.52 mg/dL — ABNORMAL HIGH (ref 0.57–1.00)
Glucose: 116 mg/dL — ABNORMAL HIGH (ref 70–99)
Potassium: 5.2 mmol/L (ref 3.5–5.2)
Sodium: 138 mmol/L (ref 134–144)
eGFR: 37 mL/min/{1.73_m2} — ABNORMAL LOW (ref >59)

## 2022-05-04 ENCOUNTER — Ambulatory Visit: Payer: Medicare HMO

## 2022-05-04 DIAGNOSIS — I209 Angina pectoris, unspecified: Secondary | ICD-10-CM

## 2022-05-05 ENCOUNTER — Other Ambulatory Visit: Payer: Self-pay | Admitting: Cardiology

## 2022-05-05 DIAGNOSIS — R600 Localized edema: Secondary | ICD-10-CM

## 2022-05-05 DIAGNOSIS — I5032 Chronic diastolic (congestive) heart failure: Secondary | ICD-10-CM

## 2022-05-09 ENCOUNTER — Ambulatory Visit
Admission: RE | Admit: 2022-05-09 | Discharge: 2022-05-09 | Disposition: A | Discharge status: Home / Self Care | Payer: Medicare HMO | Source: Ambulatory Visit | Attending: Orthopedic Surgery | Admitting: Orthopedic Surgery

## 2022-05-09 DIAGNOSIS — M4727 Other spondylosis with radiculopathy, lumbosacral region: Secondary | ICD-10-CM | POA: Diagnosis not present

## 2022-05-09 DIAGNOSIS — M545 Low back pain, unspecified: Secondary | ICD-10-CM

## 2022-05-09 MED ORDER — IOPAMIDOL (ISOVUE-M 200) INJECTION 41%
1.0000 mL | Freq: Once | INTRAMUSCULAR | Status: AC
  Administered 2022-05-09: 1 mL via EPIDURAL

## 2022-05-09 MED ORDER — METHYLPREDNISOLONE ACETATE 40 MG/ML INJ SUSP (RADIOLOG
80.0000 mg | Freq: Once | INTRAMUSCULAR | Status: AC
  Administered 2022-05-09: 80 mg via EPIDURAL

## 2022-05-09 NOTE — Progress Notes (Signed)
Can I see them for follow up and discuss treatment options?  Thanks MJP

## 2022-05-09 NOTE — Discharge Instructions (Signed)

## 2022-05-12 NOTE — Progress Notes (Signed)
Called patient and husband answered he is aware and will make an appt

## 2022-05-15 ENCOUNTER — Ambulatory Visit: Payer: Medicare HMO | Admitting: Cardiology

## 2022-05-15 ENCOUNTER — Encounter: Payer: Self-pay | Admitting: Cardiology

## 2022-05-15 VITALS — BP 123/65 | HR 73 | Ht 63.0 in | Wt 190.0 lb

## 2022-05-15 DIAGNOSIS — I350 Nonrheumatic aortic (valve) stenosis: Secondary | ICD-10-CM | POA: Diagnosis not present

## 2022-05-15 DIAGNOSIS — I10 Essential (primary) hypertension: Secondary | ICD-10-CM | POA: Diagnosis not present

## 2022-05-15 DIAGNOSIS — I480 Paroxysmal atrial fibrillation: Secondary | ICD-10-CM | POA: Diagnosis not present

## 2022-05-15 NOTE — Progress Notes (Signed)
Telephone visit  Patient referred by Gaynelle Arabian, MD for shortness of breath  Subjective:   Danielle Beck, female    DOB: 10/16/1950, 71 y.o.   MRN: [id number]  Chief Complaint  Patient presents with   Atrial Fibrillation   Follow-up   HPI  71 y.o. female with hypertension, diabetes mellitus, HFpEF, paroxysmal Afib/flutter, PSVT  Symptoms of dyspnea have improved after increasing torsemide dose. Reviewed recent test results with the patient, details below.     Current Outpatient Medications:    amLODipine (NORVASC) 10 MG tablet, Take 10 mg by mouth daily. , Disp: , Rfl:    atorvastatin (LIPITOR) 10 MG tablet, Take 10 mg by mouth daily., Disp: , Rfl:    calcium-vitamin D (OSCAL WITH D) 250-125 MG-UNIT tablet, Take 1 tablet by mouth 2 (two) times daily. , Disp: , Rfl:    dapagliflozin propanediol (FARXIGA) 10 MG TABS tablet, Take 10 mg by mouth daily., Disp: , Rfl:    dorzolamide (TRUSOPT) 2 % ophthalmic solution, Place 1 drop into the left eye 2 (two) times daily., Disp: , Rfl:    Ferrous Sulfate (IRON) 325 (65 Fe) MG TABS, 1 tablet, Disp: , Rfl:    gabapentin (NEURONTIN) 400 MG capsule, Take 400 mg by mouth 3 (three) times daily., Disp: , Rfl:    LANTUS SOLOSTAR 100 UNIT/ML Solostar Pen, Inject 30 Units into the skin daily., Disp: , Rfl:    latanoprost (XALATAN) 0.005 % ophthalmic solution, Place 1 drop into both eyes at bedtime. , Disp: , Rfl:    losartan (COZAAR) 100 MG tablet, TAKE 1 TABLET EVERY DAY, Disp: 90 tablet, Rfl: 3   metFORMIN (GLUCOPHAGE) 1000 MG tablet, Take 1 tablet by mouth in the morning and at bedtime., Disp: , Rfl:    metoprolol tartrate (LOPRESSOR) 25 MG tablet, TAKE 1 TABLET TWICE DAILY, Disp: 180 tablet, Rfl: 3   nitroGLYCERIN (NITROSTAT) 0.4 MG SL tablet, Place 1 tablet (0.4 mg total) under the tongue every 5 (five) minutes as needed for chest pain., Disp: 30 tablet, Rfl: 3   pyridOXINE (VITAMIN B-6) 100 MG tablet, Take 100 mg by mouth daily.,  Disp: , Rfl:    Rivaroxaban (XARELTO) 15 MG TABS tablet, Take 1 tablet (15 mg total) by mouth daily with supper., Disp: 90 tablet, Rfl: 3   thiamine (VITAMIN B-1) 100 MG tablet, Take 100 mg by mouth daily., Disp: , Rfl:    timolol (TIMOPTIC) 0.5 % ophthalmic solution, Place 1 drop into the left eye 2 (two) times daily., Disp: , Rfl:    torsemide (DEMADEX) 20 MG tablet, TAKE 1 TABLET EVERY DAY AND 1 ADDITIONAL TABLET IF NEEDED FOR LEG SWELLING AS DIRECTED, Disp: 180 tablet, Rfl: 3   traMADol (ULTRAM) 50 MG tablet, Take 50 mg by mouth every 8 (eight) hours as needed., Disp: , Rfl:    vitamin B-12 (CYANOCOBALAMIN) 100 MCG tablet, Take 100 mcg by mouth daily., Disp: , Rfl:   Cardiovascular studies:  Lexiscan  Nuclear stress test 05/04/2022: Myocardial perfusion is abnormal. Breast tissue attenuation noted in inferior wall. Cannot exclude mild degree medium extent perfusion defect consistent with ischemia located in the apical inferior wall and mid inferior wall. The LV appears slightly dilated in the stress images, although TID index is insignificant. Overall LV systolic function is normal without regional wall motion abnormalities.  Stress LV EF: 54%.  Non-diagnostic ECG stress. The heart rate response was consistent with Regadenoson.  Compared to previous study 03/24/2019, soft tissue attenuation  was previously noted. Possible inferior ischemia is new.  Intermediate risk.     Echocardiogram 04/06/2022:  Normal LV systolic function with visual EF 55-60%. Left ventricle cavity  is normal in size. Normal left ventricular wall thickness. Normal global  wall motion. Doppler evidence of grade II (pseudonormal) diastolic  dysfunction, elevated LAP.  Left atrial cavity is moderately dilated.  Native trileaflet aortic valve with no regurgitation. Moderate aortic  valve stenosis. AVA (VTI) measures 0.8 cm^2. AV Mean Grad measures 22  mmHg. AV Pk Vel measures 3.12 m/s.  Native mitral valve. No evidence  of mitral stenosis. Moderate (Grade III)  mitral regurgitation. Mild mitral valve leaflet calcification. Moderately  restricted mitral valve leaflets.  Structurally normal tricuspid valve with no regurgitation. No evidence of  pulmonary hypertension.  Compared to 03/2021, aortic stenosis has progressed slightly.   EKG 04/17/2022: Sinus rhythm 78 bpm  Left anterior fascicular block LVH Anteroseptal infarct -age undetermined  Echocardiogram 04/06/2022:  Normal LV systolic function with visual EF 55-60%. Left ventricle cavity  is normal in size. Normal left ventricular wall thickness. Normal global  wall motion. Doppler evidence of grade II (pseudonormal) diastolic  dysfunction, elevated LAP. Left atrial cavity is moderately dilated.  Native trileaflet aortic valve with no regurgitation. Moderate aortic  valve stenosis. AVA (VTI) measures 0.8 cm^2. AV Mean Grad measures 22  mmHg. AV Pk Vel measures 3.12 m/s.  Native mitral valve. No evidence of mitral stenosis. Moderate (Grade III)  mitral regurgitation. Mild mitral valve leaflet calcification. Moderately  restricted mitral valve leaflets.  Structurally normal tricuspid valve with no regurgitation. No evidence of  pulmonary hypertension.  Compared to 03/2021, aortic stenosis has progressed slightly.   EKG 09/27/2021: Sinus rhythm 77 bpm LAFB Left ventricular hypertrophy Old anteroseptal infarct  Echocardiogram 03/31/2021:  Normal LV systolic function with visual EF 60-65%. Left ventricle cavity  is normal in size. Mild left ventricular hypertrophy. Doppler evidence of  grade II diastolic dysfunction, elevated LAP. Normal global wall motion.  Left atrial cavity is moderately dilated.  MIld to moderate aortic stenosis. Vmax 2.4 m/sec, mean PG 18 mmHg, AVA 1.3  cm2, DI 0.4.  Moderate (Grade II) mitral regurgitation.  Mild tricuspid regurgitation. No evidence of pulmonary hypertension.  IVC is normal with a respiratory response of  <50%.  Compared to study 03/30/2020: no significant change.   Real time outpatient cardiac telemetry 09/23/2019: Monitoring period 360 hr 41 min HR 56-170 bpm. Avg HR 83 bpm Rare ventricular ectopy (PVC/pair) Frequent PAC's and occasional atrial bigeminy in sinus rhythm.  Multiple runs of SVT at 150 bpm with associated T wave inversions, followed by coarse Afib/atrial flutter episodes SVT burden <1%, Afib burden <1% No symptoms associated with above arrhythmia. Patient activated events correlate with sinus rhythm. No VT/high grade AV block, sinus pause >3sec noted.  ABI 09/12/2019:  This exam reveals normal perfusion of the lower extremity (ABI 1.07 bilateral).   Triphasic (normal) waveform noted at the level of the ankle.  Lexiscan Tetrofosmin Stress Test  03/24/2019: Resting EKG normal sinus rhythm, stress EKG nondiagnostic due to pharmacologic stress test using Lexiscan.  Status symptoms included dyspnea and dizziness. There is a prominent diaphragmatic attenuation artifact in the inferior wall. without ischemia or scar. Left ventricular ejection fraction is  58% with normal wall motion. Low risk study. No previous exam available for comparison.  CTA chest 03/05/2019: No evidence of pulmonary embolism. Mild vascular congestion and edema with small pleural effusions. Aortic Atherosclerosis.   Recent labs: 06/20/2021: Glucose  97, BUN/Cr 45/1.41. EGFR 40. Na/K 138/4.7. Rest of the CMP normal H/H 10/31. MCV 92. Platelets 269 HbA1C 6.0%  12/01/2020: Glucose 143 HbA1C 6.6% Chol 182, TG 153, HDL 61, LDL 95  Review of Systems  Cardiovascular:  Positive for chest pain, dyspnea on exertion (Improved) and leg swelling. Negative for palpitations and syncope.  All other systems reviewed and are negative.      Vitals:   05/15/22 1120  BP: 123/65  Pulse: 73      Objective:    Physical exam: Not performed, telephone visit    ICD-10-CM   1. Essential hypertension  Z73 Basic  metabolic panel    2. Nonrheumatic aortic valve stenosis  I35.0     3. PAF (paroxysmal atrial fibrillation) (HCC)  I48.0         Assessment & Recommendations:   71 y.o. female with hypertension, diabetes mellitus, HFpEF, mod AS, mod MR,  paroxysmal Afib/flutter, PSVT  AS, MR: At least moderate AS, MR. There is discrepancy between Vmax, mean PG, and AVA-which is <1 cm2. Her symptoms of exertional chest pain and dyspnea are out of proportion to mod AS. There is a possibility of paradoxically low flow low gradient AS.Stress test is equivocal for mild inferior ischemia. Symptoms have improved with diuretic therapy. Continue the same. Check BMP for f/u of Cr.  Will hold off heart catheterization for now.  Paroxysmal Afib/flutter + PSVT: Improved. CHA2DS2VASc score 4, annual stroke risk 5%. Continue Xarelto 15 mg daily, given GFR <50. Stable normocytic anemia Okay to hold Xarelto up to 3 days for spinal injections or dentist appts.   Hypertension: Continue current antihypertensive therapy.  F/u in 3 months F/u echocardiogram in 6 months    Nigel Mormon, MD Pager: [phone] Office: [phone]

## 2022-05-17 DIAGNOSIS — E113511 Type 2 diabetes mellitus with proliferative diabetic retinopathy with macular edema, right eye: Secondary | ICD-10-CM | POA: Diagnosis not present

## 2022-05-18 DIAGNOSIS — I10 Essential (primary) hypertension: Secondary | ICD-10-CM | POA: Diagnosis not present

## 2022-05-19 LAB — BASIC METABOLIC PANEL
BUN/Creatinine Ratio: 39 — ABNORMAL HIGH (ref 12–28)
BUN: 51 mg/dL — ABNORMAL HIGH (ref 8–27)
CO2: 23 mmol/L (ref 20–29)
Calcium: 10.3 mg/dL (ref 8.7–10.3)
Chloride: 96 mmol/L (ref 96–106)
Creatinine, Ser: 1.32 mg/dL — ABNORMAL HIGH (ref 0.57–1.00)
Glucose: 92 mg/dL (ref 70–99)
Potassium: 4.8 mmol/L (ref 3.5–5.2)
Sodium: 134 mmol/L (ref 134–144)
eGFR: 43 mL/min/{1.73_m2} — ABNORMAL LOW (ref >59)

## 2022-05-26 ENCOUNTER — Telehealth: Payer: Self-pay

## 2022-05-26 NOTE — Patient Outreach (Signed)
  Care Coordination   Initial Visit Note   05/26/2022 Name: ORCHID GLASSBERG MRN: [id number] DOB: [birth year]  Alvina Filbert is a 71 y.o. year old female who sees Gaynelle Arabian, MD for primary care. I spoke with  Alvina Filbert by phone today.  What matters to the patients health and wellness today?  No concerns.  Did not give HIPAA    Goals Addressed   None     SDOH assessments and interventions completed:  No     Care Coordination Interventions:  No, not indicated   Follow up plan: No further intervention required.   Encounter Outcome:  Pt. Refused  Peter Garter RN, Jackquline [place], [facility] [phone]

## 2022-06-04 IMAGING — MR MR LUMBAR SPINE W/O CM
4 of 5 series · 16 of 48 positions shown · non-contrast
Comparison: None.

CLINICAL DATA: Low back pain potentially associated with
radiculopathy. Additional history provided by scanning technologist:
Patient reports low back pain with bilateral hip and leg
pain/numbness for 1 year (right greater than left).

EXAM:
MRI LUMBAR SPINE WITHOUT CONTRAST
TECHNIQUE: Multiplanar, multisequence MR imaging of the lumbar spine was
performed. No intravenous contrast was administered.

[Series 5: T2 · sagittal · 4.0mm · 0.73mm/px · 6 of 15 slices shown (1 of 2)]
[im 1/15]
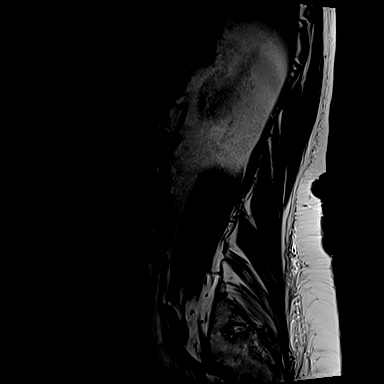
[im 3/15]
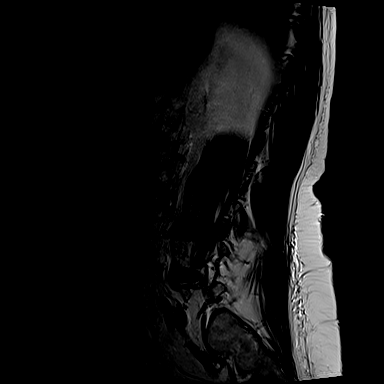
[im 6/15]
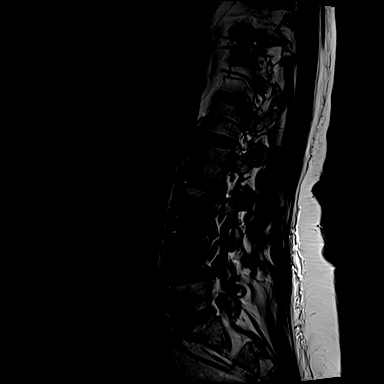
[im 9/15]
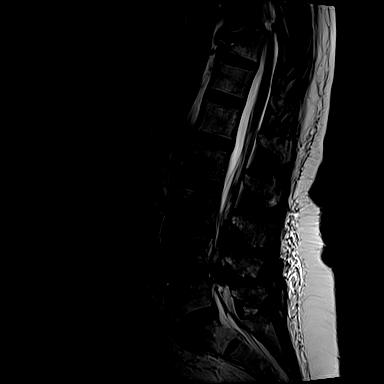
[im 12/15]
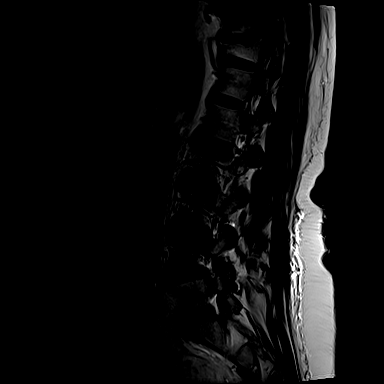
[im 15/15]
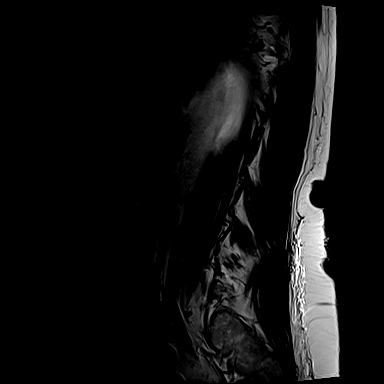

[Series 6: T1 · sagittal · 4.0mm · 0.73mm/px · 3 of 15 slices shown (1 of 2)]
[im 3/15]
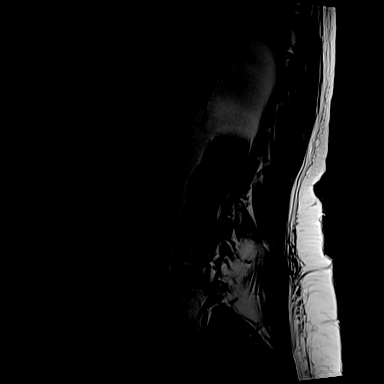
[im 9/15]
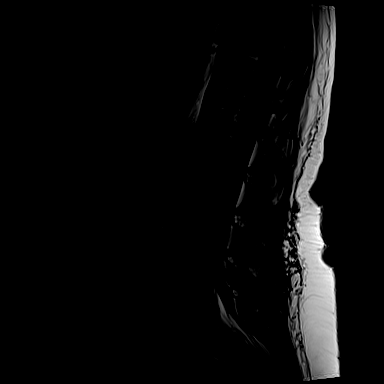
[im 15/15]
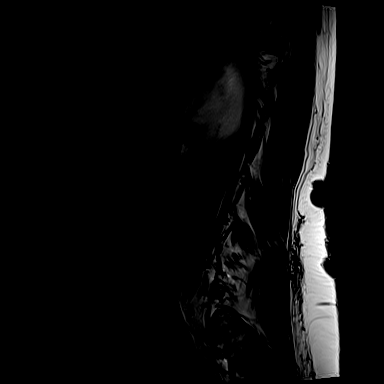

[Series 10: T1 · axial · 4.0mm · 0.28mm/px · z∈[+44,+200]mm · 3 of 37 slices shown (2 of 2)]
[im 6/37]
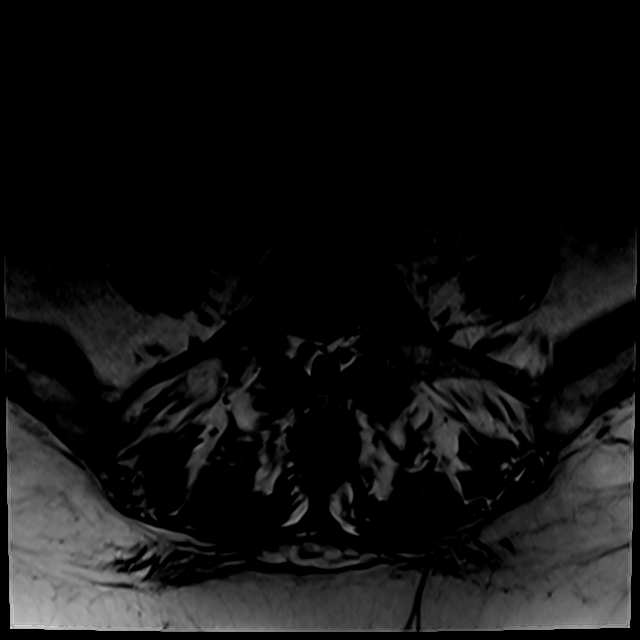
[im 19/37]
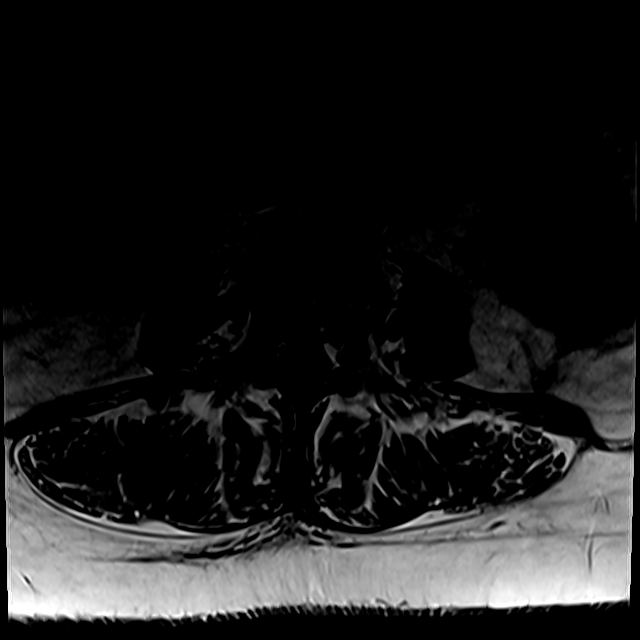
[im 31/37]
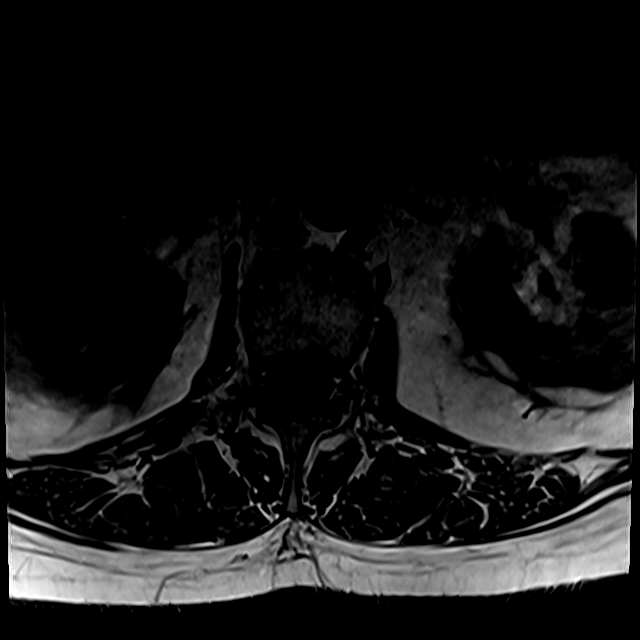

[Series 13: T2 · axial · 4.0mm · 0.28mm/px · z∈[+19,+200]mm · 4 of 37 slices shown (2 of 2)]
[im 1/37]
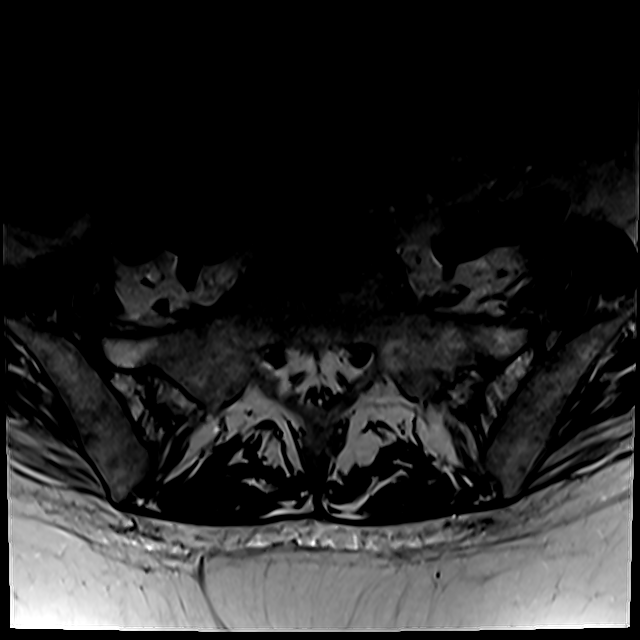
[im 6/37]
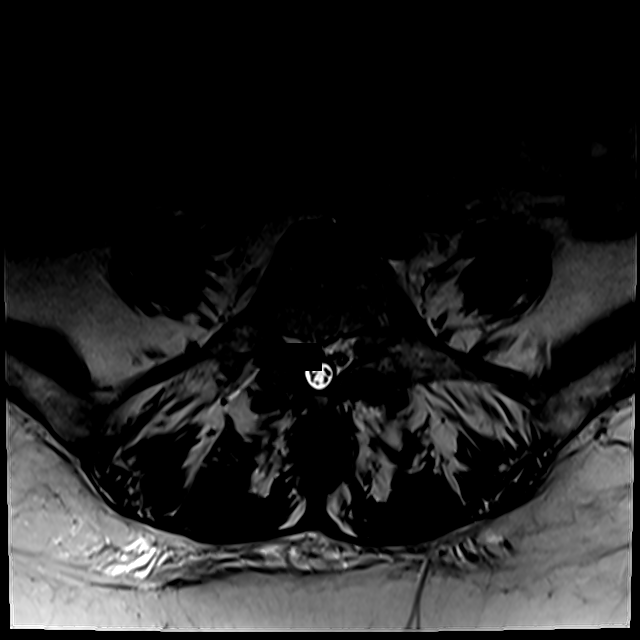
[im 19/37]
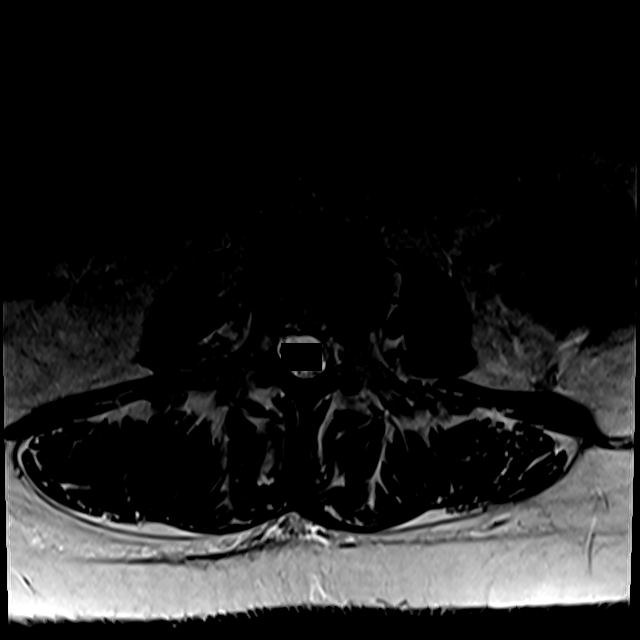
[im 31/37]
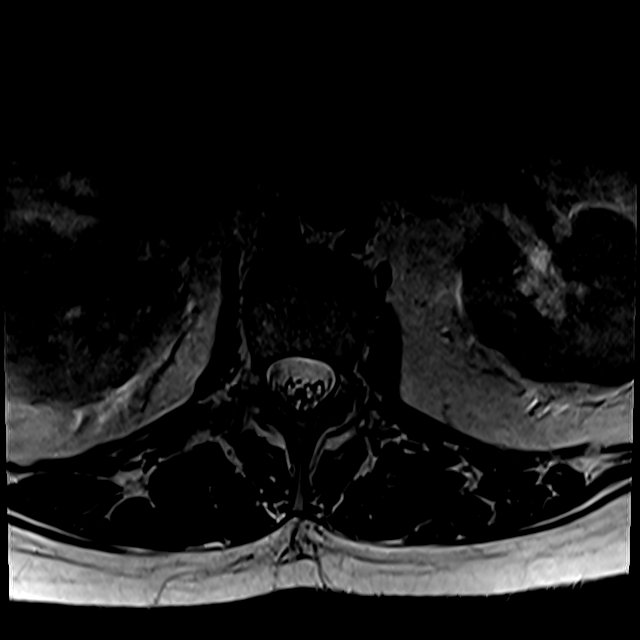

[16 of 48 positions shown; findings below may reference images not displayed]

FINDINGS: Segmentation: For the purposes of this dictation, five lumbar
vertebrae are assumed and the caudal most well-formed intervertebral
disc is designated L5-S1.

Alignment: Focal kyphotic angulation within the lower thoracic
spine, centered at the level of a chronic T11 anterior wedge
vertebral compression fracture. Mild bony retropulsion at the level
of the T11 superior endplate. No significant spondylolisthesis.

Vertebrae: Chronic T11 anterior wedge vertebral compression fracture
(70% height loss anteriorly). Minimal edema along the T11 superior
and inferior endplates appears degenerative. Vertebral body height
is otherwise maintained. Small hemangioma within the L3 superior
endplate.

Conus medullaris and cauda equina: Conus extends to the L1-L2 level.
No signal abnormality identified within the visualized distal spinal
cord.

Paraspinal and other soft tissues: Extrarenal pelves (anatomic
variant). No abnormality identified within included portions of the
abdomen/retroperitoneum.

Disc levels:

Moderate/advanced disc degeneration at T10-T11. No more than mild
disc degeneration at the remaining lumbar or visualized lower
thoracic levels.

T10-T11: Imaged sagittally. 2-3 mm bony retropulsion at the level of
the T11 superior endplate with associated disc bulge. Facet
arthrosis/ligamentum flavum hypertrophy. Mild spinal canal
narrowing. Bilateral neural foraminal narrowing (moderate right,
mild left).

T11-T12: Imaged sagittally. Slight disc bulge. No significant spinal
canal or foraminal stenosis.

T12-L1: No significant disc herniation or stenosis.

L1-L2: Small disc bulge. No significant spinal canal or foraminal
stenosis.

L2-L3: Small disc bulge. Superimposed small left
subarticular/foraminal disc protrusion. Ligamentum flavum
hypertrophy. The disc protrusion results in mild left subarticular
narrowing and may contact the descending left L3 nerve root (series
13, image 16). Central canal patent. No significant foraminal
stenosis.

L3-L4: Disc bulge. Superimposed broad-based shallow central disc
protrusion spanning the central, left subarticular and left
foraminal zones (at site of posterior annular fissure). Ligamentum
flavum hypertrophy. Moderate bilateral subarticular stenosis with
crowding of the bilateral descending L4 nerve roots. Mild relative
narrowing of the central canal. Mild relative left inferior neural
foraminal narrowing.

L4-L5: Disc bulge. Superimposed broad-based disc extrusion spanning
the central and bilateral subarticular zones (at site of posterior
annular fissure). The disc extrusion has migrated caudally to the
mid L5 level and in the left center/subarticular zone. Advanced
facet arthrosis with ligamentum flavum hypertrophy. 5 mm ventrally
projecting synovial facet cyst on the right (series 5, image 6).
Severe central canal and bilateral subarticular stenosis with near
complete effacement of the thecal sac. Mild relative bilateral
neural foraminal narrowing.

L5-S1: Disc bulge. Mild endplate spurring greatest along left aspect
of the disc space. Mild facet arthrosis (predominantly on the left).
No significant spinal canal or foraminal stenosis.
IMPRESSION: Lumbar spondylosis, as outlined and with findings most notably as
follows.

At L4-L5, there is a disc bulge. Superimposed broad-based disc
extrusion spanning the central and bilateral subarticular zones (at
site of posterior annular fissure). The disc extrusion has migrated
caudally to the mid L5 level within the left center/subarticular
zone. Advanced facet arthrosis with ligamentum flavum hypertrophy. 5
mm ventrally projecting synovial facet cyst on the right. Resultant
severe central canal and bilateral subarticular stenosis with near
complete effacement of the thecal sac. Mild relative bilateral
neural foraminal narrowing.

At L3-L4, there is a disc bulge. Superimposed broad-based shallow
central disc protrusion spanning the central, left subarticular and
left foraminal zones (at site of posterior annular fissure). This
contributes to moderate bilateral subarticular stenosis with
crowding of the bilateral descending L4 nerve roots. Mild relative
narrowing of the central canal and left neural foramen.

At L2-L3, there is a small left subarticular/foraminal disc
protrusion which contributes to mild left subarticular narrowing,
and may contact the descending left L3 nerve root. It also
contributes to mild relative left inferior neural foraminal
narrowing.

Chronic T11 anterior wedge vertebral compression fracture (70%
height loss anteriorly).

At T10-T11, there is mild spinal canal stenosis due to 2-3 mm T11
superior endplate bony retropulsion, disc bulge and facet
arthrosis/ligamentum flavum hypertrophy. Bilateral neural foraminal
narrowing (moderate right, mild left) also present at this level.

## 2022-06-14 DIAGNOSIS — E113512 Type 2 diabetes mellitus with proliferative diabetic retinopathy with macular edema, left eye: Secondary | ICD-10-CM | POA: Diagnosis not present

## 2022-06-19 DIAGNOSIS — N1832 Chronic kidney disease, stage 3b: Secondary | ICD-10-CM | POA: Diagnosis not present

## 2022-06-19 DIAGNOSIS — E1139 Type 2 diabetes mellitus with other diabetic ophthalmic complication: Secondary | ICD-10-CM | POA: Diagnosis not present

## 2022-06-19 DIAGNOSIS — I5032 Chronic diastolic (congestive) heart failure: Secondary | ICD-10-CM | POA: Diagnosis not present

## 2022-06-19 DIAGNOSIS — D6869 Other thrombophilia: Secondary | ICD-10-CM | POA: Diagnosis not present

## 2022-06-19 DIAGNOSIS — I700 Atherosclerosis of aorta: Secondary | ICD-10-CM | POA: Diagnosis not present

## 2022-06-19 DIAGNOSIS — E7800 Pure hypercholesterolemia, unspecified: Secondary | ICD-10-CM | POA: Diagnosis not present

## 2022-06-19 DIAGNOSIS — E1142 Type 2 diabetes mellitus with diabetic polyneuropathy: Secondary | ICD-10-CM | POA: Diagnosis not present

## 2022-06-19 DIAGNOSIS — I480 Paroxysmal atrial fibrillation: Secondary | ICD-10-CM | POA: Diagnosis not present

## 2022-06-19 DIAGNOSIS — D638 Anemia in other chronic diseases classified elsewhere: Secondary | ICD-10-CM | POA: Diagnosis not present

## 2022-06-22 ENCOUNTER — Encounter: Payer: Self-pay | Admitting: Cardiology

## 2022-07-10 ENCOUNTER — Other Ambulatory Visit: Payer: Medicare HMO

## 2022-07-17 ENCOUNTER — Ambulatory Visit: Payer: Medicare HMO | Admitting: Cardiology

## 2022-07-19 DIAGNOSIS — E113511 Type 2 diabetes mellitus with proliferative diabetic retinopathy with macular edema, right eye: Secondary | ICD-10-CM | POA: Diagnosis not present

## 2022-08-17 ENCOUNTER — Other Ambulatory Visit: Payer: Self-pay | Admitting: Cardiology

## 2022-08-17 DIAGNOSIS — I480 Paroxysmal atrial fibrillation: Secondary | ICD-10-CM

## 2022-08-19 ENCOUNTER — Inpatient Hospital Stay (HOSPITAL_COMMUNITY)
Admission: EM | Admit: 2022-08-19 | Discharge: 2022-08-22 | DRG: 291 | Disposition: A | Discharge status: Home Health | Payer: Medicare HMO | Attending: Internal Medicine | Admitting: Internal Medicine

## 2022-08-19 ENCOUNTER — Other Ambulatory Visit: Payer: Self-pay

## 2022-08-19 ENCOUNTER — Emergency Department (HOSPITAL_COMMUNITY): Payer: Medicare HMO

## 2022-08-19 ENCOUNTER — Encounter (HOSPITAL_COMMUNITY): Payer: Self-pay | Admitting: Emergency Medicine

## 2022-08-19 ENCOUNTER — Encounter: Payer: Self-pay | Admitting: Cardiology

## 2022-08-19 ENCOUNTER — Inpatient Hospital Stay (HOSPITAL_COMMUNITY): Payer: Medicare HMO

## 2022-08-19 DIAGNOSIS — E1122 Type 2 diabetes mellitus with diabetic chronic kidney disease: Secondary | ICD-10-CM | POA: Diagnosis not present

## 2022-08-19 DIAGNOSIS — E871 Hypo-osmolality and hyponatremia: Secondary | ICD-10-CM | POA: Diagnosis present

## 2022-08-19 DIAGNOSIS — N179 Acute kidney failure, unspecified: Secondary | ICD-10-CM | POA: Diagnosis not present

## 2022-08-19 DIAGNOSIS — Z794 Long term (current) use of insulin: Secondary | ICD-10-CM | POA: Diagnosis not present

## 2022-08-19 DIAGNOSIS — E1165 Type 2 diabetes mellitus with hyperglycemia: Secondary | ICD-10-CM | POA: Diagnosis present

## 2022-08-19 DIAGNOSIS — R0603 Acute respiratory distress: Secondary | ICD-10-CM | POA: Diagnosis not present

## 2022-08-19 DIAGNOSIS — G4700 Insomnia, unspecified: Secondary | ICD-10-CM | POA: Diagnosis present

## 2022-08-19 DIAGNOSIS — Z1152 Encounter for screening for COVID-19: Secondary | ICD-10-CM

## 2022-08-19 DIAGNOSIS — Z6834 Body mass index (BMI) 34.0-34.9, adult: Secondary | ICD-10-CM | POA: Diagnosis not present

## 2022-08-19 DIAGNOSIS — J9601 Acute respiratory failure with hypoxia: Secondary | ICD-10-CM | POA: Diagnosis not present

## 2022-08-19 DIAGNOSIS — D649 Anemia, unspecified: Secondary | ICD-10-CM | POA: Diagnosis present

## 2022-08-19 DIAGNOSIS — D509 Iron deficiency anemia, unspecified: Secondary | ICD-10-CM | POA: Diagnosis not present

## 2022-08-19 DIAGNOSIS — I509 Heart failure, unspecified: Secondary | ICD-10-CM | POA: Diagnosis not present

## 2022-08-19 DIAGNOSIS — I471 Supraventricular tachycardia, unspecified: Secondary | ICD-10-CM | POA: Diagnosis not present

## 2022-08-19 DIAGNOSIS — E66811 Obesity, class 1: Secondary | ICD-10-CM

## 2022-08-19 DIAGNOSIS — I480 Paroxysmal atrial fibrillation: Secondary | ICD-10-CM | POA: Diagnosis not present

## 2022-08-19 DIAGNOSIS — Z7984 Long term (current) use of oral hypoglycemic drugs: Secondary | ICD-10-CM

## 2022-08-19 DIAGNOSIS — N1832 Chronic kidney disease, stage 3b: Secondary | ICD-10-CM | POA: Diagnosis present

## 2022-08-19 DIAGNOSIS — I10 Essential (primary) hypertension: Secondary | ICD-10-CM | POA: Diagnosis present

## 2022-08-19 DIAGNOSIS — E1169 Type 2 diabetes mellitus with other specified complication: Secondary | ICD-10-CM | POA: Diagnosis present

## 2022-08-19 DIAGNOSIS — I5032 Chronic diastolic (congestive) heart failure: Secondary | ICD-10-CM | POA: Diagnosis present

## 2022-08-19 DIAGNOSIS — R0902 Hypoxemia: Principal | ICD-10-CM

## 2022-08-19 DIAGNOSIS — E669 Obesity, unspecified: Secondary | ICD-10-CM | POA: Diagnosis not present

## 2022-08-19 DIAGNOSIS — I110 Hypertensive heart disease with heart failure: Secondary | ICD-10-CM | POA: Diagnosis not present

## 2022-08-19 DIAGNOSIS — E785 Hyperlipidemia, unspecified: Secondary | ICD-10-CM | POA: Diagnosis not present

## 2022-08-19 DIAGNOSIS — I130 Hypertensive heart and chronic kidney disease with heart failure and stage 1 through stage 4 chronic kidney disease, or unspecified chronic kidney disease: Principal | ICD-10-CM | POA: Diagnosis present

## 2022-08-19 DIAGNOSIS — Z833 Family history of diabetes mellitus: Secondary | ICD-10-CM

## 2022-08-19 DIAGNOSIS — J90 Pleural effusion, not elsewhere classified: Secondary | ICD-10-CM | POA: Diagnosis not present

## 2022-08-19 DIAGNOSIS — Z8249 Family history of ischemic heart disease and other diseases of the circulatory system: Secondary | ICD-10-CM

## 2022-08-19 DIAGNOSIS — R0600 Dyspnea, unspecified: Secondary | ICD-10-CM | POA: Diagnosis not present

## 2022-08-19 DIAGNOSIS — R0602 Shortness of breath: Secondary | ICD-10-CM | POA: Diagnosis not present

## 2022-08-19 DIAGNOSIS — R600 Localized edema: Secondary | ICD-10-CM

## 2022-08-19 DIAGNOSIS — I080 Rheumatic disorders of both mitral and aortic valves: Secondary | ICD-10-CM | POA: Diagnosis not present

## 2022-08-19 DIAGNOSIS — I5033 Acute on chronic diastolic (congestive) heart failure: Secondary | ICD-10-CM | POA: Diagnosis not present

## 2022-08-19 DIAGNOSIS — Z79899 Other long term (current) drug therapy: Secondary | ICD-10-CM | POA: Diagnosis not present

## 2022-08-19 DIAGNOSIS — D72829 Elevated white blood cell count, unspecified: Secondary | ICD-10-CM | POA: Insufficient documentation

## 2022-08-19 DIAGNOSIS — I350 Nonrheumatic aortic (valve) stenosis: Secondary | ICD-10-CM | POA: Diagnosis present

## 2022-08-19 DIAGNOSIS — I4892 Unspecified atrial flutter: Secondary | ICD-10-CM | POA: Diagnosis not present

## 2022-08-19 DIAGNOSIS — E119 Type 2 diabetes mellitus without complications: Secondary | ICD-10-CM

## 2022-08-19 DIAGNOSIS — R0789 Other chest pain: Secondary | ICD-10-CM | POA: Diagnosis not present

## 2022-08-19 DIAGNOSIS — Z7901 Long term (current) use of anticoagulants: Secondary | ICD-10-CM | POA: Diagnosis not present

## 2022-08-19 DIAGNOSIS — J810 Acute pulmonary edema: Secondary | ICD-10-CM

## 2022-08-19 DIAGNOSIS — E876 Hypokalemia: Secondary | ICD-10-CM | POA: Diagnosis present

## 2022-08-19 LAB — RESPIRATORY PANEL BY PCR

## 2022-08-19 LAB — IRON AND TIBC
Iron: 23 ug/dL — ABNORMAL LOW (ref 28–170)
Saturation Ratios: 8 % — ABNORMAL LOW (ref 10.4–31.8)
TIBC: 276 ug/dL (ref 250–450)
UIBC: 253 ug/dL

## 2022-08-19 LAB — URINALYSIS, ROUTINE W REFLEX MICROSCOPIC
Bacteria, UA: NONE SEEN
Bilirubin Urine: NEGATIVE
Glucose, UA: 50 mg/dL — AB
Hgb urine dipstick: NEGATIVE
Ketones, ur: NEGATIVE mg/dL
Nitrite: NEGATIVE
Protein, ur: 100 mg/dL — AB
Specific Gravity, Urine: 1.008 (ref 1.005–1.030)
pH: 6 (ref 5.0–8.0)

## 2022-08-19 LAB — COMPREHENSIVE METABOLIC PANEL
ALT: 43 U/L (ref 0–44)
AST: 72 U/L — ABNORMAL HIGH (ref 15–41)
Albumin: 3.7 g/dL (ref 3.5–5.0)
Alkaline Phosphatase: 55 U/L (ref 38–126)
Anion gap: 13 (ref 5–15)
BUN: 44 mg/dL — ABNORMAL HIGH (ref 8–23)
CO2: 18 mmol/L — ABNORMAL LOW (ref 22–32)
Calcium: 9.1 mg/dL (ref 8.9–10.3)
Chloride: 90 mmol/L — ABNORMAL LOW (ref 98–111)
Creatinine, Ser: 2.15 mg/dL — ABNORMAL HIGH (ref 0.44–1.00)
GFR, Estimated: 24 mL/min — ABNORMAL LOW (ref >60)
Glucose, Bld: 108 mg/dL — ABNORMAL HIGH (ref 70–99)
Potassium: 5.1 mmol/L (ref 3.5–5.1)
Sodium: 121 mmol/L — ABNORMAL LOW (ref 135–145)
Total Bilirubin: 0.8 mg/dL (ref 0.3–1.2)
Total Protein: 6.8 g/dL (ref 6.5–8.1)

## 2022-08-19 LAB — CBC WITH DIFFERENTIAL/PLATELET
Abs Immature Granulocytes: 0.13 10*3/uL — ABNORMAL HIGH (ref 0.00–0.07)
Basophils Absolute: 0.1 10*3/uL (ref 0.0–0.1)
Basophils Relative: 0 %
Eosinophils Absolute: 0.1 10*3/uL (ref 0.0–0.5)
Eosinophils Relative: 1 %
HCT: 27.5 % — ABNORMAL LOW (ref 36.0–46.0)
Hemoglobin: 9.7 g/dL — ABNORMAL LOW (ref 12.0–15.0)
Immature Granulocytes: 1 %
Lymphocytes Relative: 7 %
Lymphs Abs: 1.4 10*3/uL (ref 0.7–4.0)
MCH: 31.7 pg (ref 26.0–34.0)
MCHC: 35.3 g/dL (ref 30.0–36.0)
MCV: 89.9 fL (ref 80.0–100.0)
Monocytes Absolute: 0.9 10*3/uL (ref 0.1–1.0)
Monocytes Relative: 5 %
Neutro Abs: 16.1 10*3/uL — ABNORMAL HIGH (ref 1.7–7.7)
Neutrophils Relative %: 86 %
Platelets: 271 10*3/uL (ref 150–400)
RBC: 3.06 MIL/uL — ABNORMAL LOW (ref 3.87–5.11)
RDW: 12.3 % (ref 11.5–15.5)
WBC: 18.7 10*3/uL — ABNORMAL HIGH (ref 4.0–10.5)
nRBC: 0 % (ref 0.0–0.2)

## 2022-08-19 LAB — I-STAT BETA HCG BLOOD, ED (MC, WL, AP ONLY): I-stat hCG, quantitative: 7.4 m[IU]/mL — ABNORMAL HIGH (ref <5)

## 2022-08-19 LAB — MRSA NEXT GEN BY PCR, NASAL: MRSA by PCR Next Gen: NOT DETECTED

## 2022-08-19 LAB — OSMOLALITY, URINE: Osmolality, Ur: 225 mOsm/kg — ABNORMAL LOW (ref 300–900)

## 2022-08-19 LAB — GLUCOSE, CAPILLARY
Glucose-Capillary: 121 mg/dL — ABNORMAL HIGH (ref 70–99)
Glucose-Capillary: 116 mg/dL — ABNORMAL HIGH (ref 70–99)
Glucose-Capillary: 226 mg/dL — ABNORMAL HIGH (ref 70–99)
Glucose-Capillary: 219 mg/dL — ABNORMAL HIGH (ref 70–99)

## 2022-08-19 LAB — MAGNESIUM: Magnesium: 2.1 mg/dL (ref 1.7–2.4)

## 2022-08-19 LAB — OSMOLALITY: Osmolality: 276 mOsm/kg (ref 275–295)

## 2022-08-19 LAB — LACTIC ACID, PLASMA
Lactic Acid, Venous: 1.3 mmol/L (ref 0.5–1.9)
Lactic Acid, Venous: 0.9 mmol/L (ref 0.5–1.9)
Lactic Acid, Venous: 0.7 mmol/L (ref 0.5–1.9)

## 2022-08-19 LAB — HIV ANTIBODY (ROUTINE TESTING W REFLEX): HIV Screen 4th Generation wRfx: NONREACTIVE

## 2022-08-19 LAB — BRAIN NATRIURETIC PEPTIDE: B Natriuretic Peptide: 663.9 pg/mL — ABNORMAL HIGH (ref 0.0–100.0)

## 2022-08-19 LAB — NA AND K (SODIUM & POTASSIUM), RAND UR
Potassium Urine: 23 mmol/L
Sodium, Ur: 28 mmol/L

## 2022-08-19 LAB — RESP PANEL BY RT-PCR (RSV, FLU A&B, COVID)  RVPGX2
Influenza A by PCR: NEGATIVE
Influenza B by PCR: NEGATIVE
Resp Syncytial Virus by PCR: NEGATIVE
SARS Coronavirus 2 by RT PCR: NEGATIVE

## 2022-08-19 LAB — FERRITIN: Ferritin: 44 ng/mL (ref 11–307)

## 2022-08-19 LAB — TROPONIN I (HIGH SENSITIVITY)
Troponin I (High Sensitivity): 11 ng/L (ref <18)
Troponin I (High Sensitivity): 15 ng/L (ref <18)

## 2022-08-19 LAB — PROCALCITONIN: Procalcitonin: <0.1 ng/mL

## 2022-08-19 MED ORDER — CHLORHEXIDINE GLUCONATE CLOTH 2 % EX PADS
6.0000 | MEDICATED_PAD | Freq: Every day | CUTANEOUS | Status: DC
  Administered 2022-08-19 – 2022-08-22 (×4): 6 via TOPICAL

## 2022-08-19 MED ORDER — SODIUM CHLORIDE 0.9 % IV SOLN: 250.0000 mL | INTRAVENOUS | Status: DC | PRN

## 2022-08-19 MED ORDER — SODIUM CHLORIDE 0.9 % IV SOLN
1.0000 g | Freq: Once | INTRAVENOUS | Status: AC
  Administered 2022-08-19: 1 g via INTRAVENOUS
  Filled 2022-08-19: qty 10

## 2022-08-19 MED ORDER — DORZOLAMIDE HCL 2 % OP SOLN
1.0000 [drp] | Freq: Two times a day (BID) | OPHTHALMIC | Status: DC
  Administered 2022-08-19 – 2022-08-22 (×7): 1 [drp] via OPHTHALMIC
  Filled 2022-08-19: qty 10

## 2022-08-19 MED ORDER — MELATONIN 3 MG PO TABS
3.0000 mg | ORAL_TABLET | Freq: Every evening | ORAL | Status: DC | PRN
  Administered 2022-08-19 – 2022-08-21 (×3): 3 mg via ORAL
  Filled 2022-08-19 (×3): qty 1

## 2022-08-19 MED ORDER — INSULIN ASPART 100 UNIT/ML IJ SOLN
0.0000 [IU] | Freq: Three times a day (TID) | INTRAMUSCULAR | Status: DC
  Administered 2022-08-19 – 2022-08-21 (×5): 3 [IU] via SUBCUTANEOUS
  Administered 2022-08-21: 5 [IU] via SUBCUTANEOUS
  Administered 2022-08-21: 9 [IU] via SUBCUTANEOUS
  Administered 2022-08-22: 5 [IU] via SUBCUTANEOUS
  Administered 2022-08-22: 7 [IU] via SUBCUTANEOUS

## 2022-08-19 MED ORDER — TIMOLOL MALEATE 0.5 % OP SOLN
1.0000 [drp] | Freq: Two times a day (BID) | OPHTHALMIC | Status: DC
  Administered 2022-08-19 – 2022-08-22 (×7): 1 [drp] via OPHTHALMIC
  Filled 2022-08-19: qty 5

## 2022-08-19 MED ORDER — RIVAROXABAN 15 MG PO TABS
15.0000 mg | ORAL_TABLET | Freq: Every day | ORAL | Status: DC
  Administered 2022-08-19 – 2022-08-21 (×3): 15 mg via ORAL
  Filled 2022-08-19 (×4): qty 1

## 2022-08-19 MED ORDER — ACETAMINOPHEN 325 MG PO TABS
650.0000 mg | ORAL_TABLET | Freq: Four times a day (QID) | ORAL | Status: DC | PRN
  Administered 2022-08-20: 650 mg via ORAL
  Filled 2022-08-19: qty 2

## 2022-08-19 MED ORDER — FUROSEMIDE 10 MG/ML IJ SOLN
40.0000 mg | Freq: Once | INTRAMUSCULAR | Status: DC
  Filled 2022-08-19: qty 4

## 2022-08-19 MED ORDER — ALBUTEROL SULFATE (2.5 MG/3ML) 0.083% IN NEBU: 2.5000 mg | INHALATION_SOLUTION | RESPIRATORY_TRACT | Status: DC | PRN

## 2022-08-19 MED ORDER — SODIUM CHLORIDE 0.9 % IV SOLN
500.0000 mg | Freq: Once | INTRAVENOUS | Status: AC
  Administered 2022-08-19: 500 mg via INTRAVENOUS
  Filled 2022-08-19: qty 5

## 2022-08-19 MED ORDER — METHOCARBAMOL 500 MG PO TABS
500.0000 mg | ORAL_TABLET | Freq: Every day | ORAL | Status: DC
  Administered 2022-08-19 – 2022-08-21 (×3): 500 mg via ORAL
  Filled 2022-08-19 (×3): qty 1

## 2022-08-19 MED ORDER — ATORVASTATIN CALCIUM 10 MG PO TABS
10.0000 mg | ORAL_TABLET | Freq: Every day | ORAL | Status: DC
  Administered 2022-08-19 – 2022-08-22 (×4): 10 mg via ORAL
  Filled 2022-08-19 (×4): qty 1

## 2022-08-19 MED ORDER — ACETAMINOPHEN 650 MG RE SUPP: 650.0000 mg | Freq: Four times a day (QID) | RECTAL | Status: DC | PRN

## 2022-08-19 MED ORDER — ALBUTEROL SULFATE HFA 108 (90 BASE) MCG/ACT IN AERS: 2.0000 | INHALATION_SPRAY | RESPIRATORY_TRACT | Status: DC | PRN

## 2022-08-19 MED ORDER — ARTIFICIAL TEARS OPHTHALMIC OINT
TOPICAL_OINTMENT | Freq: Two times a day (BID) | OPHTHALMIC | Status: DC
  Filled 2022-08-19: qty 3.5

## 2022-08-19 MED ORDER — GABAPENTIN 400 MG PO CAPS
400.0000 mg | ORAL_CAPSULE | Freq: Three times a day (TID) | ORAL | Status: DC
  Administered 2022-08-19 – 2022-08-21 (×7): 400 mg via ORAL
  Filled 2022-08-19 (×7): qty 1

## 2022-08-19 MED ORDER — ONDANSETRON HCL 4 MG PO TABS: 4.0000 mg | ORAL_TABLET | Freq: Four times a day (QID) | ORAL | Status: DC | PRN

## 2022-08-19 MED ORDER — FUROSEMIDE 10 MG/ML IJ SOLN
40.0000 mg | Freq: Once | INTRAMUSCULAR | Status: AC
  Administered 2022-08-19: 40 mg via INTRAVENOUS

## 2022-08-19 MED ORDER — FUROSEMIDE 10 MG/ML IJ SOLN
40.0000 mg | Freq: Two times a day (BID) | INTRAMUSCULAR | Status: DC
  Administered 2022-08-19 – 2022-08-22 (×6): 40 mg via INTRAVENOUS
  Filled 2022-08-19 (×6): qty 4

## 2022-08-19 MED ORDER — AMLODIPINE BESYLATE 10 MG PO TABS
10.0000 mg | ORAL_TABLET | Freq: Every day | ORAL | Status: DC
  Administered 2022-08-19 – 2022-08-22 (×4): 10 mg via ORAL
  Filled 2022-08-19 (×4): qty 1

## 2022-08-19 MED ORDER — SODIUM CHLORIDE 0.9% FLUSH: 3.0000 mL | INTRAVENOUS | Status: DC | PRN

## 2022-08-19 MED ORDER — FUROSEMIDE 10 MG/ML IJ SOLN
40.0000 mg | Freq: Once | INTRAMUSCULAR | Status: AC
  Administered 2022-08-19: 40 mg via INTRAVENOUS
  Filled 2022-08-19: qty 4

## 2022-08-19 MED ORDER — FUROSEMIDE 10 MG/ML IJ SOLN: 40.0000 mg | Freq: Two times a day (BID) | INTRAMUSCULAR | Status: DC

## 2022-08-19 MED ORDER — ONDANSETRON HCL 4 MG/2ML IJ SOLN: 4.0000 mg | Freq: Four times a day (QID) | INTRAMUSCULAR | Status: DC | PRN

## 2022-08-19 MED ORDER — METOPROLOL TARTRATE 25 MG PO TABS
25.0000 mg | ORAL_TABLET | Freq: Two times a day (BID) | ORAL | Status: DC
  Administered 2022-08-19 – 2022-08-22 (×7): 25 mg via ORAL
  Filled 2022-08-19 (×7): qty 1

## 2022-08-19 MED ORDER — SODIUM CHLORIDE 0.9% FLUSH
3.0000 mL | Freq: Two times a day (BID) | INTRAVENOUS | Status: DC
  Administered 2022-08-19 – 2022-08-22 (×6): 3 mL via INTRAVENOUS

## 2022-08-19 MED ORDER — LATANOPROST 0.005 % OP SOLN
1.0000 [drp] | Freq: Every day | OPHTHALMIC | Status: DC
  Administered 2022-08-19 – 2022-08-21 (×3): 1 [drp] via OPHTHALMIC
  Filled 2022-08-19: qty 2.5

## 2022-08-19 MED ORDER — ALBUTEROL SULFATE (2.5 MG/3ML) 0.083% IN NEBU
2.5000 mg | INHALATION_SOLUTION | RESPIRATORY_TRACT | Status: DC | PRN
  Administered 2022-08-19: 2.5 mg via RESPIRATORY_TRACT
  Filled 2022-08-19: qty 3

## 2022-08-19 NOTE — Assessment & Plan Note (Signed)
Well controlled Continue lopressor 25mg  BID, norvasc 10mg  daily Hold losartan 100mg  due to AKI on CKD.

## 2022-08-19 NOTE — ED Notes (Signed)
INCREASED O2TO 4L

## 2022-08-19 NOTE — Assessment & Plan Note (Signed)
Likely hypervolemia hyponatremia in setting of CHF exacerbation UA pending Follow with diuresis Urine studies were ordered in ED

## 2022-08-19 NOTE — Assessment & Plan Note (Signed)
Secondary to acute on chronic diastolic CHF exacerbation Continue IV diuresis R/o pneumonia, but low on differential On xarelto with no missed doses, PE very low on differential  Wean oxygen as tolerated

## 2022-08-19 NOTE — Assessment & Plan Note (Signed)
WBC to 18.7; however afebrile with on other source of infection. UA is still pending, but no urinary complaints.  Given CAP coverage due to limited ability to r/o consolidation on her CXR with acute respiratory failure Still think more reaction over infection Check PCT/RVP Already had her CAP abx today, will continue until PCT results.  Trend fever curve and CBC

## 2022-08-19 NOTE — ED Notes (Signed)
ED TO INPATIENT HANDOFF REPORT  ED Nurse Name and Phone #: Carron Curie, RN [phone]  S Name/Age/Gender Danielle Beck 72 y.o. female Room/Bed: 015C/015C  Code Status   Code Status: Not on file  Home/SNF/Other Home Patient oriented to: self, place, time, and situation Is this baseline? Yes   Triage Complete: Triage complete  Chief Complaint Acute exacerbation of CHF (congestive heart failure) ([place]) [I50.9]  Triage Note Per from home, c/o SOB X2 days.  Pt does have a hx of cardiac issues.     Allergies No Known Allergies  Level of Care/Admitting Diagnosis ED Disposition     ED Disposition  Admit   Condition  --   Comment  Hospital Area: [facility] [[id number]]  Level of Care: Telemetry Cardiac [103]  May admit patient to [facility] or [facility] if equivalent level of care is available:: No  Covid Evaluation: Asymptomatic - no recent exposure (last 10 days) testing not required  Diagnosis: Acute exacerbation of CHF (congestive heart failure) [facility]) [id number]  Admitting Physician: Kayleen Memos [id number]  Attending Physician: Kayleen Memos [id number]  Certification:: I certify this patient will need inpatient services for at least 2 midnights  Estimated Length of Stay: 2          B Medical/Surgery History Past Medical History:  Diagnosis Date   CHF (congestive heart failure) ([place])    Diabetes mellitus without complication ([place])    Hyperlipidemia    Hypertension    Past Surgical History:  Procedure Laterality Date   CATARACT EXTRACTION     TUBAL LIGATION  1985     A IV Location/Drains/Wounds Patient Lines/Drains/Airways Status     Active Line/Drains/Airways     Name Placement date Placement time Site Days   Peripheral IV 08/19/22 20 G 1" Anterior;Left Forearm 08/19/22  0425  Forearm  less than 1            Intake/Output Last 24 hours  Intake/Output Summary (Last 24 hours) at 08/19/2022 [id number] Last data filed at 08/19/2022  [id number] Gross per 24 hour  Intake 50 ml  Output --  Net 50 ml    Labs/Imaging Results for orders placed or performed during the hospital encounter of 08/19/22 (from the past 48 hour(s))  Comprehensive metabolic panel     Status: Abnormal   Collection Time: 08/19/22  4:15 AM  Result Value Ref Range   Sodium 121 (L) 135 - 145 mmol/L   Potassium 5.1 3.5 - 5.1 mmol/L    Comment: HEMOLYSIS AT THIS LEVEL MAY AFFECT RESULT   Chloride 90 (L) 98 - 111 mmol/L   CO2 18 (L) 22 - 32 mmol/L   Glucose, Bld 108 (H) 70 - 99 mg/dL    Comment: Glucose reference range applies only to samples taken after fasting for at least 8 hours.   BUN 44 (H) 8 - 23 mg/dL   Creatinine, Ser 2.15 (H) 0.44 - 1.00 mg/dL   Calcium 9.1 8.9 - 10.3 mg/dL   Total Protein 6.8 6.5 - 8.1 g/dL   Albumin 3.7 3.5 - 5.0 g/dL   AST 72 (H) 15 - 41 U/L    Comment: HEMOLYSIS AT THIS LEVEL MAY AFFECT RESULT   ALT 43 0 - 44 U/L    Comment: HEMOLYSIS AT THIS LEVEL MAY AFFECT RESULT   Alkaline Phosphatase 55 38 - 126 U/L   Total Bilirubin 0.8 0.3 - 1.2 mg/dL    Comment: HEMOLYSIS AT THIS LEVEL MAY AFFECT RESULT  GFR, Estimated 24 (L) >60 mL/min    Comment: (NOTE) Calculated using the CKD-EPI Creatinine Equation (2021)    Anion gap 13 5 - 15    Comment: Performed at [facility], [place] [address], [place], [place] [address]  Brain natriuretic peptide     Status: Abnormal   Collection Time: 08/19/22  4:15 AM  Result Value Ref Range   B Natriuretic Peptide 663.9 (H) 0.0 - 100.0 pg/mL    Comment: Performed at [facility] [address], [place], [place] [address]  Troponin I (High Sensitivity)     Status: None   Collection Time: 08/19/22  4:15 AM  Result Value Ref Range   Troponin I (High Sensitivity) 11 <18 ng/L    Comment: (NOTE) Elevated high sensitivity troponin I (hsTnI) values and significant  changes across serial measurements may suggest ACS but many other  chronic and acute conditions are known to elevate  hsTnI results.  Refer to the "Links" section for chest pain algorithms and additional  guidance. Performed at [facility], [place] [address] [place], [place] [address]   CBC with Differential     Status: Abnormal   Collection Time: 08/19/22  4:15 AM  Result Value Ref Range   WBC 18.7 (H) 4.0 - 10.5 K/uL   RBC 3.06 (L) 3.87 - 5.11 MIL/uL   Hemoglobin 9.7 (L) 12.0 - 15.0 g/dL   HCT 27.5 (L) 36.0 - 46.0 %   MCV 89.9 80.0 - 100.0 fL   MCH 31.7 26.0 - 34.0 pg   MCHC 35.3 30.0 - 36.0 g/dL   RDW 12.3 11.5 - 15.5 %   Platelets 271 150 - 400 K/uL   nRBC 0.0 0.0 - 0.2 %   Neutrophils Relative % 86 %   Neutro Abs 16.1 (H) 1.7 - 7.7 K/uL   Lymphocytes Relative 7 %   Lymphs Abs 1.4 0.7 - 4.0 K/uL   Monocytes Relative 5 %   Monocytes Absolute 0.9 0.1 - 1.0 K/uL   Eosinophils Relative 1 %   Eosinophils Absolute 0.1 0.0 - 0.5 K/uL   Basophils Relative 0 %   Basophils Absolute 0.1 0.0 - 0.1 K/uL   Immature Granulocytes 1 %   Abs Immature Granulocytes 0.13 (H) 0.00 - 0.07 K/uL    Comment: Performed at [facility] [address], [place],  [address]  Resp panel by RT-PCR (RSV, Flu A&B, Covid) Anterior Nasal Swab     Status: None   Collection Time: 08/19/22  4:16 AM   Specimen: Anterior Nasal Swab  Result Value Ref Range   SARS Coronavirus 2 by RT PCR NEGATIVE NEGATIVE   Influenza A by PCR NEGATIVE NEGATIVE   Influenza B by PCR NEGATIVE NEGATIVE    Comment: (NOTE) The Xpert Xpress SARS-CoV-2/FLU/RSV plus assay is intended as an aid in the diagnosis of influenza from Nasopharyngeal swab specimens and should not be used as a sole basis for treatment. Nasal washings and aspirates are unacceptable for Xpert Xpress SARS-CoV-2/FLU/RSV testing.  Fact Sheet for Patients: EntrepreneurPulse.com.au  Fact Sheet for Healthcare Providers: IncredibleEmployment.be  This test is not yet approved or cleared by the [place] FDA and has been  authorized for detection and/or diagnosis of SARS-CoV-2 by FDA under an Emergency Use Authorization (EUA). This EUA will remain in effect (meaning this test can be used) for the duration of the COVID-19 declaration under Section 564(b)(1) of the Act, 21 U.S.C. section 360bbb-3(b)(1), unless the authorization is  terminated or revoked.     Resp Syncytial Virus by PCR NEGATIVE NEGATIVE    Comment: (NOTE) Fact Sheet for Patients: EntrepreneurPulse.com.au  Fact Sheet for Healthcare Providers: IncredibleEmployment.be  This test is not yet approved or cleared by the [place] FDA and has been authorized for detection and/or diagnosis of SARS-CoV-2 by FDA under an Emergency Use Authorization (EUA). This EUA will remain in effect (meaning this test can be used) for the duration of the COVID-19 declaration under Section 564(b)(1) of the Act, 21 U.S.C. section 360bbb-3(b)(1), unless the authorization is terminated or revoked.  Performed at [facility], [place] [address] [place], [place] [address]   I-Stat beta hCG blood, ED ([facility], [facility], [facility] only)     Status: Abnormal   Collection Time: 08/19/22  4:31 AM  Result Value Ref Range   I-stat hCG, quantitative 7.4 (H) <5 mIU/mL   Comment 3            Comment:   GEST. AGE      CONC.  (mIU/mL)   <=1 WEEK        5 - 50     2 WEEKS       50 - 500     3 WEEKS       100 - 10,000     4 WEEKS     1,000 - 30,000        FEMALE AND NON-PREGNANT FEMALE:     LESS THAN 5 mIU/mL   Lactic acid, plasma     Status: None   Collection Time: 08/19/22  5:20 AM  Result Value Ref Range   Lactic Acid, Venous 1.3 0.5 - 1.9 mmol/L    Comment: Performed at [facility] [address] [place], [place] [address]   DG Chest 2 View  Result Date: 08/19/2022 CLINICAL DATA:  Shortness of breath. EXAM: CHEST - 2 VIEW COMPARISON:  03/05/2019 FINDINGS: The lungs are clear without focal pneumonia, edema, pneumothorax or pleural  effusion. Diffuse interstitial opacity is progressive in the interval suggesting pulmonary edema. No focal consolidation or substantial pleural effusion. The cardiopericardial silhouette is within normal limits for size. The visualized bony structures of the thorax are unremarkable. Telemetry leads overlie the chest. IMPRESSION: Interval progression of diffuse interstitial opacity suggesting pulmonary edema. Electronically Signed   By: Misty Stanley M.D.   On: 08/19/2022 05:28    Pending Labs Unresulted Labs (From admission, onward)     Start     Ordered   08/19/22 0720  Lactic acid, plasma  Now then every 2 hours,   R (with TIMED occurrences)      08/19/22 0604   08/19/22 0508  Na and K (sodium & potassium), rand urine  Once,   URGENT        08/19/22 0507   08/19/22 0508  Osmolality  Add-on,   AD        08/19/22 0507   08/19/22 0508  Osmolality, urine  Once,   URGENT        08/19/22 0507   08/19/22 0507  Urinalysis, Routine w reflex microscopic -Urine, Clean Catch  Once,   URGENT       Question:  Specimen Source  Answer:  Urine, Clean Catch   08/19/22 0507   08/19/22 0448  Blood culture (routine x 2)  BLOOD CULTURE X 2,   R (with STAT occurrences)      08/19/22 [id number]  Vitals/Pain Today's Vitals   08/19/22 0434 08/19/22 0545 08/19/22 0600 08/19/22 0611  BP:  (!) 121/51 130/62   Pulse:  87 86 87  Resp:  17    Temp:      TempSrc:      SpO2: 97% 93%  91%  PainSc:        Isolation Precautions No active isolations  Medications Medications  albuterol (VENTOLIN HFA) 108 (90 Base) MCG/ACT inhaler 2 puff (has no administration in time range)  azithromycin (ZITHROMAX) 500 mg in sodium chloride 0.9 % 250 mL IVPB (500 mg Intravenous New Bag/Given 08/19/22 0614)  furosemide (LASIX) injection 40 mg (40 mg Intravenous Given 08/19/22 0531)  cefTRIAXone (ROCEPHIN) 1 g in sodium chloride 0.9 % 100 mL IVPB (0 g Intravenous Stopped 08/19/22 [id number])    Mobility walks     Focused  Assessments respiratory   R Recommendations: See Admitting Provider Note  Report given to:   Additional Notes: Pt is A&O x4 - husband is an MD - very knowledgeable and great historian.

## 2022-08-19 NOTE — ED Provider Notes (Signed)
[facility] Provider Note   CSN: [id number] Arrival date & time: 08/19/22  0402     History  Chief Complaint  Patient presents with   Shortness of Breath    Danielle Beck is a 72 y.o. female.  With PMH of HTN, HLD, HFpEF, PAF on Xarelto, mitral regurgitation, aortic stenosis who presents with shortness of breath.  Patient has had worsening shortness of breath and new crackles and wheezes over the past 2 to 3 days.  She has been having associated orthopnea and dyspnea on exertion.  She has had approximately 10 to 15 pound weight gain over the past year.  She has had some increased swelling in her lower extremities.  She has been compliant with all of her medications including torsemide 20 mg twice daily and her daily Xarelto.  She denies any fevers, chills, congestion, rhinorrhea, sore throat, chest pain.  She just complains of chest discomfort with breathing especially when laying flat.  She is typically sleeping with 2 pillows a day but has been unable to.  She also has been having a cough but that has been dry.   Shortness of Breath      Home Medications Prior to Admission medications   Medication Sig Start Date End Date Taking? Authorizing Provider  amLODipine (NORVASC) 10 MG tablet Take 10 mg by mouth daily.  01/26/19   [provider]  atorvastatin (LIPITOR) 10 MG tablet Take 10 mg by mouth daily. 12/31/18   [provider]  calcium-vitamin D (OSCAL WITH D) 250-125 MG-UNIT tablet Take 1 tablet by mouth 2 (two) times daily.     [provider]  dapagliflozin propanediol (FARXIGA) 10 MG TABS tablet Take 10 mg by mouth daily.    [provider]  dorzolamide (TRUSOPT) 2 % ophthalmic solution Place 1 drop into the left eye 2 (two) times daily. 12/25/18   [provider]  Ferrous Sulfate (IRON) 325 (65 Fe) MG TABS 1 tablet    [provider]  gabapentin (NEURONTIN) 400 MG capsule Take  400 mg by mouth 3 (three) times daily. 01/05/19   [provider]  LANTUS SOLOSTAR 100 UNIT/ML Solostar Pen Inject 30 Units into the skin daily. 02/06/19   [provider]  latanoprost (XALATAN) 0.005 % ophthalmic solution Place 1 drop into both eyes at bedtime.  12/25/18   [provider]  losartan (COZAAR) 100 MG tablet TAKE 1 TABLET EVERY DAY 10/25/21   Patwardhan, Manish J, MD  metFORMIN (GLUCOPHAGE) 1000 MG tablet Take 1 tablet by mouth in the morning and at bedtime. 11/26/19   [provider]  methocarbamol (ROBAXIN) 500 MG tablet Take 500-1,000 mg by mouth every 6 (six) hours as needed. 04/10/22   [provider]  metoprolol tartrate (LOPRESSOR) 25 MG tablet TAKE 1 TABLET TWICE DAILY 08/18/22   Patwardhan, Reynold Bowen, MD  nitroGLYCERIN (NITROSTAT) 0.4 MG SL tablet Place 1 tablet (0.4 mg total) under the tongue every 5 (five) minutes as needed for chest pain. 04/17/22 07/16/22  Patwardhan, Reynold Bowen, MD  pyridOXINE (VITAMIN B-6) 100 MG tablet Take 100 mg by mouth daily.    [provider]  Rivaroxaban (XARELTO) 15 MG TABS tablet Take 1 tablet (15 mg total) by mouth daily with supper. 09/27/21   Patwardhan, Reynold Bowen, MD  thiamine (VITAMIN B-1) 100 MG tablet Take 100 mg by mouth daily.    [provider]  timolol (TIMOPTIC) 0.5 % ophthalmic solution Place 1  drop into the left eye 2 (two) times daily. 12/26/18   [provider]  torsemide (DEMADEX) 20 MG tablet TAKE 1 TABLET EVERY DAY AND 1 ADDITIONAL TABLET IF NEEDED FOR LEG SWELLING AS DIRECTED 05/09/22   Patwardhan, Manish J, MD  traMADol (ULTRAM) 50 MG tablet Take 50 mg by mouth 2 (two) times daily. 12/26/21   [provider]  vitamin B-12 (CYANOCOBALAMIN) 100 MCG tablet Take 100 mcg by mouth daily.    [provider]      Allergies    Patient has no known allergies.    Review of Systems   Review of Systems  Respiratory:  Positive for shortness of breath.      Physical Exam Updated Vital Signs BP 130/62   Pulse 87   Temp 98.8 F (37.1 C) (Oral)   Resp 17   SpO2 91%  Physical Exam Constitutional: Alert and oriented. Fatigued in appearance, but NAD Eyes: Conjunctivae are normal. ENT      Head: Normocephalic and atraumatic.      Nose: No congestion.      Mouth/Throat: Mucous membranes are moist.      Neck: No stridor. Cardiovascular: S1, S2, regular rate and rhythm equal palpable radial pulses Respiratory: Tachypnea, bilateral crackles, O2 sat high 80s on RA, improved to 94-97 on 2L [place], intermittent dry cough Gastrointestinal: Soft and nontender. There is no CVA tenderness. Musculoskeletal: Normal range of motion in all extremities. 2+ equal nontender pitting edema extending from feet to knee bilaterally Neurologic: Normal speech and language.  Moving extremities x 4 equally.  Sensation grossly intact.  No gross focal neurologic deficits are appreciated. Skin: Skin is warm, dry  Psychiatric: Mood and affect are normal. Speech and behavior are normal.  ED Results / Procedures / Treatments   Labs (all labs ordered are listed, but only abnormal results are displayed) Labs Reviewed  COMPREHENSIVE METABOLIC PANEL - Abnormal; Notable for the following components:      Result Value   Sodium 121 (*)    Chloride 90 (*)    CO2 18 (*)    Glucose, Bld 108 (*)    BUN 44 (*)    Creatinine, Ser 2.15 (*)    AST 72 (*)    GFR, Estimated 24 (*)    All other components within normal limits  BRAIN NATRIURETIC PEPTIDE - Abnormal; Notable for the following components:   B Natriuretic Peptide 663.9 (*)    All other components within normal limits  CBC WITH DIFFERENTIAL/PLATELET - Abnormal; Notable for the following components:   WBC 18.7 (*)    RBC 3.06 (*)    Hemoglobin 9.7 (*)    HCT 27.5 (*)    Neutro Abs 16.1 (*)    Abs Immature Granulocytes 0.13 (*)    All other components within normal limits  I-STAT BETA HCG BLOOD, ED ([facility], [facility], [facility]  ONLY) - Abnormal; Notable for the following components:   I-stat hCG, quantitative 7.4 (*)    All other components within normal limits  RESP PANEL BY RT-PCR (RSV, FLU A&B, COVID)  RVPGX2  CULTURE, BLOOD (ROUTINE X 2)  CULTURE, BLOOD (ROUTINE X 2)  LACTIC ACID, PLASMA  URINALYSIS, ROUTINE W REFLEX MICROSCOPIC  NA AND K (SODIUM & POTASSIUM), RAND UR  OSMOLALITY  OSMOLALITY, URINE  LACTIC ACID, PLASMA  LACTIC ACID, PLASMA  I-STAT VENOUS BLOOD GAS, ED  TROPONIN I (HIGH SENSITIVITY)  TROPONIN I (HIGH SENSITIVITY)    EKG EKG Interpretation  Date/Time:  Saturday August 19 2022 04:20:46 EDT Ventricular Rate:  84 PR Interval:  213 QRS Duration: 118 QT Interval:  408 QTC Calculation: 483 R Axis:   -37 Text Interpretation: Sinus rhythm  T wave changes lateral leads No significant change since last tracing Confirmed by Georgina Snell [phone]) on 08/19/2022 4:27:25 AM  Radiology DG Chest 2 View  Result Date: 08/19/2022 CLINICAL DATA:  Shortness of breath. EXAM: CHEST - 2 VIEW COMPARISON:  03/05/2019 FINDINGS: The lungs are clear without focal pneumonia, edema, pneumothorax or pleural effusion. Diffuse interstitial opacity is progressive in the interval suggesting pulmonary edema. No focal consolidation or substantial pleural effusion. The cardiopericardial silhouette is within normal limits for size. The visualized bony structures of the thorax are unremarkable. Telemetry leads overlie the chest. IMPRESSION: Interval progression of diffuse interstitial opacity suggesting pulmonary edema. Electronically Signed   By: Misty Stanley M.D.   On: 08/19/2022 05:28    Procedures .Critical Care  Performed by: Elgie Congo, MD Authorized by: Elgie Congo, MD   Critical care provider statement:    Critical care time (minutes):  35   Critical care was necessary to treat or prevent imminent or life-threatening deterioration of the following conditions:  Respiratory failure   Critical  care was time spent personally by me on the following activities:  Development of treatment plan with patient or surrogate, discussions with consultants, evaluation of patient's response to treatment, examination of patient, ordering and review of laboratory studies, ordering and review of radiographic studies, ordering and performing treatments and interventions, pulse oximetry, re-evaluation of patient's condition, review of old charts and obtaining history from patient or surrogate   Care discussed with: admitting provider       Medications Ordered in ED Medications  albuterol (VENTOLIN HFA) 108 (90 Base) MCG/ACT inhaler 2 puff (has no administration in time range)  azithromycin (ZITHROMAX) 500 mg in sodium chloride 0.9 % 250 mL IVPB (500 mg Intravenous New Bag/Given 08/19/22 0614)  furosemide (LASIX) injection 40 mg (40 mg Intravenous Given 08/19/22 0531)  cefTRIAXone (ROCEPHIN) 1 g in sodium chloride 0.9 % 100 mL IVPB (0 g Intravenous Stopped 08/19/22 [id number])    ED Course/ Medical Decision Making/ A&P                            Medical Decision Making  VALMA METZGER is a 73 y.o. female.  With PMH of HTN, HLD, HFpEF, PAF on Xarelto, mitral regurgitation, aortic stenosis who presents with shortness of breath.  Performed bedside POCUS echo which showed general normal squeeze of heart, no pericardial effusion however diffuse B-lines on bilateral lungs.  Patient's presentation is concerning for fluid overload based on my bedside ultrasound as well as chest x-ray obtained which are personally reviewed showing bilateral interstitial infiltrates concerning for diffuse progression of pulmonary edema.  This is consistent with her elevated BNP 663 today.  She is hyponatremic 121 which I suspect is hypervolemic.  She has an AKI creatinine 2.15 today.  Additionally, has a leukocytosis 18.7 with left shift.  She is afebrile without infectious complaints other than cough however with leukocytosis, blood  cultures sent and started on community-acquired pneumonia treatment.  Discussed case with Dr. Nevada Crane on-call hospitalist for admission for continued management.  Amount and/or Complexity of Data Reviewed Labs: ordered. Radiology: ordered.  Risk Prescription drug management. Decision regarding hospitalization.    Final Clinical Impression(s) / ED Diagnoses Final diagnoses:  Hypoxia  AKI (acute kidney injury) ([place])  Hyponatremia  Acute pulmonary edema [facility])    Rx / DC Orders ED Discharge Orders     None         Elgie Congo, MD 08/19/22 [phone]

## 2022-08-19 NOTE — ED Notes (Signed)
Lab note draw lactic acid @7 :20am

## 2022-08-19 NOTE — Progress Notes (Addendum)
[facility] night cross cover note:   I was notified by RN of this patient's increasing supplemental oxygen requirements.  She was admitted earlier today with acute on chronic diastolic heart failure, undergoing IV diuresis via Lasix.  She experienced desaturation on 3 L nasal cannula, prompting assessment by respiratory therapy, who ultimately initiated 12 L salter, upon which oxygen saturations have improved into the mid 90s.  Will obtain updated chest x-ray at this time as well as a additional dose of Lasix 40 mg IV x 1 now, with consideration for initiation of BiPAP if additional desaturations are noted on existing high flow salter.  Update: The patient appears more comfortable from a respiratory standpoint following additional dose of IV Lasix.  Updated CXR reflects interval increase in perihilar opacities, right greater than left, consistent with edema, although unable to rule out pneumonia, also continuing to show previously noted interstitial edema with small pleural effusions.  It was noted that prior procalcitonin level was not elevated, further rendering the possibility of pneumonia to be less likely, with plan to continue to trend procalcitonin level via morning labs over the next 2 days to further assess for any initially latent pneumonia contributing to the above.    Babs Bertin, DO Hospitalist

## 2022-08-19 NOTE — ED Triage Notes (Signed)
Per from home, c/o SOB X2 days.  Pt does have a hx of cardiac issues.

## 2022-08-19 NOTE — Assessment & Plan Note (Signed)
Follow up hgb is 9.2  Positive iron deficiency anemia with serum iron of 23, TIBC 276, ferritin 44 and transferrin saturation of 8. Will plan for IV iron during this hospitalization after completing cardiac work up.

## 2022-08-19 NOTE — Plan of Care (Signed)
  Problem: Education: Goal: Knowledge of General Education information will improve Description Including pain rating scale, medication(s)/side effects and non-pharmacologic comfort measures Outcome: Progressing   

## 2022-08-19 NOTE — Progress Notes (Signed)
Called to bedside due to desaturation, patient placed on 12L [place] with improvement in saturation to 95%. RN informed.

## 2022-08-19 NOTE — Assessment & Plan Note (Addendum)
Uncontrolled with hyperglycemia.   Patient will resume her insulin regimen at home with basal and short acting insulin.   Continue with statin therapy.

## 2022-08-19 NOTE — Assessment & Plan Note (Signed)
72 year old presenting with worsening shortness of breath, weight gain and mild LE swelling found to be hypoxic on room air to 88% at home and requiring 2-4L [place] to maintain oxygenation in acute on chronic diastolic CHF -admit to progressive -CXR suggestive of pulmonary edema with BNP to 663 and worsening shortness of breath/orthopnea, respiratory failure requiring oxygen indicative of CHF exacerbation  -last echo 11/23 with normal EF and grade 2 DD. Also moderate MR and AS which has progressed since prior echo  -repeat echo, MR/AS could be contributing as well  -strict I/O and daily weights -lasix 40mg  BID, monitor renal function closely  -on home torsemide 20mg  daily with second pill PRN  -hold farxiga for now with acute on chronic kidney injury as well as her ARB -continue lopressor  -has cardiology appointment on Monday outpatient

## 2022-08-19 NOTE — Progress Notes (Signed)
[facility] night cross cover note:   I was notified by RN of the patient's request for a sleep aid. I subsequently placed order for prn melatonin for insomnia.     Lin Glazier, DO Hospitalist  

## 2022-08-19 NOTE — H&P (Signed)
History and Physical    Patient: Danielle Beck [id number] DOB: Sep 28, 1950 DOA: 08/19/2022 DOS: the patient was seen and examined on 08/19/2022 PCP: Gaynelle Arabian, MD  Patient coming from:  home   - lives with her husband. Uses cane occasionally.    Chief Complaint: shortness of breath/wheezing    HPI: Danielle Beck is a 72 y.o. female with medical history significant of T2DM, HLD, HTN, grade II diastolic dysfunction, CKD, PAF on xarelto who presented to ED with complaints of shortness of breath. 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  A few days ago she started to have mild shortness of breath and wheezing, mainly when she laid down. She took some inhalers and felt better, but every time she laid down she felt like her chest was tight and she would wheeze. She has had no fever/chills or coughing. She started to have some LE swelling as well a few days ago. Her breathing and wheezing continued to get worse and her oxygen dropped below 90% at home so she was brought to the hospital. She has gained weight over the last 6 months (13 pounds), but nothing acute over the last few weeks/days.    Denies any fever/chills, vision changes/headaches, chest pain or palpitations, cough, abdominal pain, N/V/D, dysuria.   She does not smoke or drink alcohol.   ER Course:  vitals: afebrile, bp: 124/70, HR: 85, RR: 19, oxygen: 94% on 2L increased to 4L Pertinent labs: wbc: 18.7, hgb: 9.7, sodium: 121, BUN: 44, creatinine: 2.15,  BNP 663, troponin x 1 wnl,  CXR: interval progression of diffuse interstitial opacity suggesting pulmonary edema.  In ED: given albuterol neb, zithromax/rocephin and 40mg  of lasix. TRH asked to admit.   Review of Systems: As mentioned in the history of present illness. All other systems reviewed and are negative. Past Medical History:  Diagnosis Date   CHF (congestive heart failure) ([place])    Diabetes mellitus without complication ([place])    Hyperlipidemia    Hypertension     Past Surgical History:  Procedure Laterality Date   CATARACT EXTRACTION     TUBAL LIGATION  1985   Social History:  reports that she has never smoked. She has never used smokeless tobacco. She reports that she does not currently use alcohol. She reports that she does not currently use drugs.  No Known Allergies  Family History  Problem Relation Age of Onset   Diabetes Mother    Heart failure Father    Diabetes Brother    Diabetes Brother    Diabetes Brother    Hypertension Brother    Breast cancer Neg Hx     Prior to Admission medications   Medication Sig Start Date End Date Taking? Authorizing Provider  amLODipine (NORVASC) 10 MG tablet Take 10 mg by mouth daily.  01/26/19   [provider]  atorvastatin (LIPITOR) 10 MG tablet Take 10 mg by mouth daily. 12/31/18   [provider]  calcium-vitamin D (OSCAL WITH D) 250-125 MG-UNIT tablet Take 1 tablet by mouth 2 (two) times daily.     [provider]  dapagliflozin propanediol (FARXIGA) 10 MG TABS tablet Take 10 mg by mouth daily.    [provider]  dorzolamide (TRUSOPT) 2 % ophthalmic solution Place 1 drop into the left eye 2 (two) times daily. 12/25/18   [provider]  Ferrous Sulfate (IRON) 325 (65 Fe) MG TABS 1 tablet    [provider]  gabapentin (NEURONTIN) 400 MG capsule Take 400  mg by mouth 3 (three) times daily. 01/05/19   [provider]  LANTUS SOLOSTAR 100 UNIT/ML Solostar Pen Inject 30 Units into the skin daily. 02/06/19   [provider]  latanoprost (XALATAN) 0.005 % ophthalmic solution Place 1 drop into both eyes at bedtime.  12/25/18   [provider]  losartan (COZAAR) 100 MG tablet TAKE 1 TABLET EVERY DAY 10/25/21   Patwardhan, Manish J, MD  metFORMIN (GLUCOPHAGE) 1000 MG tablet Take 1 tablet by mouth in the morning and at bedtime. 11/26/19   [provider]  methocarbamol (ROBAXIN) 500 MG tablet Take 500-1,000 mg by mouth every  6 (six) hours as needed. 04/10/22   [provider]  metoprolol tartrate (LOPRESSOR) 25 MG tablet TAKE 1 TABLET TWICE DAILY 08/18/22   Patwardhan, Reynold Bowen, MD  nitroGLYCERIN (NITROSTAT) 0.4 MG SL tablet Place 1 tablet (0.4 mg total) under the tongue every 5 (five) minutes as needed for chest pain. 04/17/22 07/16/22  Patwardhan, Reynold Bowen, MD  pyridOXINE (VITAMIN B-6) 100 MG tablet Take 100 mg by mouth daily.    [provider]  Rivaroxaban (XARELTO) 15 MG TABS tablet Take 1 tablet (15 mg total) by mouth daily with supper. 09/27/21   Patwardhan, Reynold Bowen, MD  thiamine (VITAMIN B-1) 100 MG tablet Take 100 mg by mouth daily.    [provider]  timolol (TIMOPTIC) 0.5 % ophthalmic solution Place 1 drop into the left eye 2 (two) times daily. 12/26/18   [provider]  torsemide (DEMADEX) 20 MG tablet TAKE 1 TABLET EVERY DAY AND 1 ADDITIONAL TABLET IF NEEDED FOR LEG SWELLING AS DIRECTED 05/09/22   Patwardhan, Manish J, MD  traMADol (ULTRAM) 50 MG tablet Take 50 mg by mouth 2 (two) times daily. 12/26/21   [provider]  vitamin B-12 (CYANOCOBALAMIN) 100 MCG tablet Take 100 mcg by mouth daily.    [provider]    Physical Exam: Vitals:   08/19/22 0617 08/19/22 0619 08/19/22 0656 08/19/22 0800  BP: 102/70 102/70 (!) 133/56 136/70  Pulse: 86 84 85 89  Resp:  20 20 (!) 21  Temp:  98.7 F (37.1 C) 98.2 F (36.8 C)   TempSrc:  Oral Oral   SpO2:  93% 96% 92%  Weight:   90.9 kg   Height:   5\' 3"  (1.6 m)    General:  Appears calm and comfortable and is in NAD Eyes:  PERRL, EOMI, normal lids, iris ENT:  grossly normal hearing, lips & tongue, mmm; appropriate dentition Neck:  no LAD, masses or thyromegaly; no carotid bruits, no JVD Cardiovascular:  RRR, 3/6 systolic murmur. Trace LE edema.  Respiratory:   RLL crackles, otherwise clear. No wheezing. Normal respiratory effort. Abdomen:  soft, NT, ND, NABS Back:   normal alignment, no CVAT Skin:  no  rash or induration seen on limited exam Musculoskeletal:  grossly normal tone BUE/BLE, good ROM, no bony abnormality Lower extremity:   Limited foot exam with no ulcerations.  2+ distal pulses. Psychiatric:  grossly normal mood and affect, speech fluent and appropriate, AOx3 Neurologic:  CN 2-12 grossly intact, moves all extremities in coordinated fashion, sensation intact   Radiological Exams on Admission: Independently reviewed - see discussion in A/P where applicable  DG Chest 2 View  Result Date: 08/19/2022 CLINICAL DATA:  Shortness of breath. EXAM: CHEST - 2 VIEW COMPARISON:  03/05/2019 FINDINGS: The lungs are clear without focal pneumonia, edema, pneumothorax or pleural effusion. Diffuse interstitial opacity is progressive in the  interval suggesting pulmonary edema. No focal consolidation or substantial pleural effusion. The cardiopericardial silhouette is within normal limits for size. The visualized bony structures of the thorax are unremarkable. Telemetry leads overlie the chest. IMPRESSION: Interval progression of diffuse interstitial opacity suggesting pulmonary edema. Electronically Signed   By: Misty Stanley M.D.   On: 08/19/2022 05:28    EKG: Independently reviewed.  NSR with rate 84; nonspecific ST changes with no evidence of acute ischemia   Labs on Admission: I have personally reviewed the available labs and imaging studies at the time of the admission.  Pertinent labs:    wbc: 18.7,  hgb: 9.7,  sodium: 121,  BUN: 44,  creatinine: 2.15,  BNP 663 , troponin x 1 wnl,   Assessment and Plan: Principal Problem:   Acute on chronic diastolic CHF (congestive heart failure) (HCC) Active Problems:   Acute respiratory failure with hypoxia (HCC)   Acute renal failure superimposed on stage 3b chronic kidney disease (HCC)   Hyponatremia   Leukocytosis   Normocytic anemia   Type 2 diabetes mellitus (HCC)   PAF (paroxysmal atrial fibrillation) (HCC)   Essential hypertension     Assessment and Plan: * Acute on chronic diastolic CHF (congestive heart failure) ([place]) 72 year old presenting with worsening shortness of breath, weight gain and mild LE swelling found to be hypoxic on room air to 88% at home and requiring 2-4L [place] to maintain oxygenation in acute on chronic diastolic CHF -admit to progressive -CXR suggestive of pulmonary edema with BNP to 663 and worsening shortness of breath/orthopnea, respiratory failure requiring oxygen indicative of CHF exacerbation  -last echo 11/23 with normal EF and grade 2 DD. Also moderate MR and AS which has progressed since prior echo  -repeat echo, MR/AS could be contributing as well  -strict I/O and daily weights -lasix 40mg  BID, monitor renal function closely  -on home torsemide 20mg  daily with second pill PRN  -hold farxiga for now with acute on chronic kidney injury as well as her ARB -continue lopressor  -has cardiology appointment on Monday outpatient   Acute respiratory failure with hypoxia ([place]) Secondary to acute on chronic diastolic CHF exacerbation Continue IV diuresis R/o pneumonia, but low on differential On xarelto with no missed doses, PE very low on differential  Wean oxygen as tolerated   Acute renal failure superimposed on stage 3b chronic kidney disease (HCC) Baseline creatinine is 1.3, followed by nephrology, Dr. Arman Filter  Creatinine toda is 2.15 and likely pre renal in setting of CHF exacerbation/hypoperfusion  UA pending Strict I/O Hold nephrotoxic drugs (metformin, ARB and farxiga) Continuing IV lasix, but will need to monitor renal function closely  Trend    Hyponatremia Likely hypervolemia hyponatremia in setting of CHF exacerbation UA pending Follow with diuresis Urine studies were ordered in ED   Leukocytosis WBC to 18.7; however afebrile with on other source of infection. UA is still pending, but no urinary complaints.  Given CAP coverage due to limited ability to r/o consolidation on  her CXR with acute respiratory failure Still think more reaction over infection Check PCT/RVP Already had her CAP abx today, will continue until PCT results.  Trend fever curve and CBC   Normocytic anemia Baseline around 10.0, likely due to ACD from CKD Check iron studies Continue home iron replacement UTD on cscope. Recall q 5 years, due next year   Type 2 diabetes mellitus ([place]) Recent A1C in 05/2022, well controlled at 6.3 Hold metformin Hold farxiga with acute on chronic  kidney injury  On lantus 30 units but husband states she is very sensitive if not eating 3 solid meals a day BS normal, will hold long acting and watch sugars for now SSI and accuchecks qac/hs   PAF (paroxysmal atrial fibrillation) (HCC) Rate controlled in NSR Continue lopressor 25mg  BID Continue xarelto  Continue cardiac monitoring with CHF exacerbation   Essential hypertension Well controlled Continue lopressor 25mg  BID, norvasc 10mg  daily Hold losartan 100mg  due to AKI on CKD.      Advance Care Planning:   Code Status: Full Code   Consults: none   DVT Prophylaxis: xarelto   Family Communication: husband at bedside   Severity of Illness: The appropriate patient status for this patient is INPATIENT. Inpatient status is judged to be reasonable and necessary in order to provide the required intensity of service to ensure the patient's safety. The patient's presenting symptoms, physical exam findings, and initial radiographic and laboratory data in the context of their chronic comorbidities is felt to place them at high risk for further clinical deterioration. Furthermore, it is not anticipated that the patient will be medically stable for discharge from the hospital within 2 midnights of admission.   * I certify that at the point of admission it is my clinical judgment that the patient will require inpatient hospital care spanning beyond 2 midnights from the point of admission due to high intensity of  service, high risk for further deterioration and high frequency of surveillance required.*  Author: Orma Flaming, MD 08/19/2022 8:44 AM  For on call review [email or web address].

## 2022-08-19 NOTE — Assessment & Plan Note (Addendum)
Hyponatremia.  Renal function with serum cr at 2,10 with K at 3,8 and serum bicarbonate at 18,  Na 125 and Mg at 2.4   Plan to continue diuresis with furosemide 40 mg IV q12 hrs.   At home patient on torsemide and dapagliflozin.

## 2022-08-19 NOTE — Assessment & Plan Note (Signed)
Rate controlled in NSR Continue lopressor 25mg  BID Continue xarelto  Continue cardiac monitoring with CHF exacerbation

## 2022-08-20 ENCOUNTER — Other Ambulatory Visit (HOSPITAL_COMMUNITY): Payer: Medicare HMO

## 2022-08-20 ENCOUNTER — Inpatient Hospital Stay (HOSPITAL_COMMUNITY): Payer: Medicare HMO

## 2022-08-20 DIAGNOSIS — E1169 Type 2 diabetes mellitus with other specified complication: Secondary | ICD-10-CM

## 2022-08-20 DIAGNOSIS — N179 Acute kidney failure, unspecified: Secondary | ICD-10-CM | POA: Diagnosis not present

## 2022-08-20 DIAGNOSIS — N1832 Chronic kidney disease, stage 3b: Secondary | ICD-10-CM

## 2022-08-20 DIAGNOSIS — I10 Essential (primary) hypertension: Secondary | ICD-10-CM | POA: Diagnosis not present

## 2022-08-20 DIAGNOSIS — D649 Anemia, unspecified: Secondary | ICD-10-CM | POA: Diagnosis not present

## 2022-08-20 DIAGNOSIS — E785 Hyperlipidemia, unspecified: Secondary | ICD-10-CM

## 2022-08-20 DIAGNOSIS — I5033 Acute on chronic diastolic (congestive) heart failure: Secondary | ICD-10-CM | POA: Diagnosis not present

## 2022-08-20 DIAGNOSIS — E669 Obesity, unspecified: Secondary | ICD-10-CM

## 2022-08-20 LAB — CBC
HCT: 25.7 % — ABNORMAL LOW (ref 36.0–46.0)
Hemoglobin: 9.2 g/dL — ABNORMAL LOW (ref 12.0–15.0)
MCH: 32.1 pg (ref 26.0–34.0)
MCHC: 35.8 g/dL (ref 30.0–36.0)
MCV: 89.5 fL (ref 80.0–100.0)
Platelets: 246 10*3/uL (ref 150–400)
RBC: 2.87 MIL/uL — ABNORMAL LOW (ref 3.87–5.11)
RDW: 12.3 % (ref 11.5–15.5)
WBC: 12.8 10*3/uL — ABNORMAL HIGH (ref 4.0–10.5)
nRBC: 0 % (ref 0.0–0.2)

## 2022-08-20 LAB — BASIC METABOLIC PANEL
Anion gap: 10 (ref 5–15)
BUN: 42 mg/dL — ABNORMAL HIGH (ref 8–23)
CO2: 21 mmol/L — ABNORMAL LOW (ref 22–32)
Calcium: 8.9 mg/dL (ref 8.9–10.3)
Chloride: 95 mmol/L — ABNORMAL LOW (ref 98–111)
Creatinine, Ser: 2.06 mg/dL — ABNORMAL HIGH (ref 0.44–1.00)
GFR, Estimated: 25 mL/min — ABNORMAL LOW (ref >60)
Glucose, Bld: 235 mg/dL — ABNORMAL HIGH (ref 70–99)
Potassium: 3.5 mmol/L (ref 3.5–5.1)
Sodium: 126 mmol/L — ABNORMAL LOW (ref 135–145)

## 2022-08-20 LAB — GLUCOSE, CAPILLARY
Glucose-Capillary: 246 mg/dL — ABNORMAL HIGH (ref 70–99)
Glucose-Capillary: 220 mg/dL — ABNORMAL HIGH (ref 70–99)
Glucose-Capillary: 234 mg/dL — ABNORMAL HIGH (ref 70–99)
Glucose-Capillary: 306 mg/dL — ABNORMAL HIGH (ref 70–99)

## 2022-08-20 LAB — PROCALCITONIN: Procalcitonin: 0.13 ng/mL

## 2022-08-20 MED ORDER — POTASSIUM CHLORIDE CRYS ER 20 MEQ PO TBCR
40.0000 meq | EXTENDED_RELEASE_TABLET | Freq: Once | ORAL | Status: AC
  Administered 2022-08-20: 40 meq via ORAL
  Filled 2022-08-20: qty 2

## 2022-08-20 MED ORDER — SODIUM CHLORIDE 0.9 % IV SOLN
500.0000 mg | Freq: Every day | INTRAVENOUS | Status: DC
  Administered 2022-08-20: 500 mg via INTRAVENOUS
  Filled 2022-08-20 (×2): qty 5

## 2022-08-20 MED ORDER — SODIUM CHLORIDE 0.9 % IV SOLN
1.0000 g | Freq: Every day | INTRAVENOUS | Status: DC
  Administered 2022-08-20: 1 g via INTRAVENOUS
  Filled 2022-08-20: qty 10

## 2022-08-20 NOTE — Assessment & Plan Note (Signed)
Calculated BMI is 34,2  

## 2022-08-20 NOTE — Hospital Course (Signed)
Danielle Beck was admitted to the hospital with the working diagnosis of heart failure exacerbation.   72 yo female with the past medical history of hypertension, dyslipidemia, T2DM, CKD, atrial fibrillation and heart failure who presented with dyspnea and wheezing. Reported few days of worsening dyspnea and wheezing, along with PND and lower extremity edema. Positive weight gain 13 lbs over last 6 months. On the day of  hospitalization her 02 saturation was 90%, and she was brought to the hospital. On her initial physical examination her blood pressure was 124/70, HR 85, RR 19 and 02 saturation 94% on 4 L/min per [place], lungs with right lower lobes rales with no wheezing, heart with S1 and S2 present and regular with systolic murmur 3/6, abdomen with no distention and positive lower extremity edema.   Na 121, K 5,1 Cl 90 bicarbonate 18, glucose 108 bun 44 cr 2,15  AST 72 and ALT 43  BNP 663  High sensitive troponin 15 and 11  Wbc 18,7 hgb 9,7 plt 271  Sars covid 19 negative  Viral respiratory panel negative  Urine analysis SG 1,008, protein 100, glucose 50, small leukocytes.   Chest radiograph with cardiomegaly, bilateral hilar vascular congestion, cephalization of the vasculature and bilateral central interstitial infiltrates, small bilateral pleural effusions.   EKG 84 bpm, left axis deviation, interventricular conduction delay, sinus rhythm with no significant ST segment changes, negative T wave lead I and AvL.   03/25 patient placed on furosemide for diuresis, volume status is improving but not back to baseline.  03/26 improved volume status and renal function, plan to continue diuresis at home with oral torsemide, close follow up as outpatient.

## 2022-08-20 NOTE — Progress Notes (Addendum)
Progress Note   Patient: Danielle Beck [id number] DOB: July 24, 1950 DOA: 08/19/2022     1 DOS: the patient was seen and examined on 08/20/2022   Brief hospital course: Mrs. Tilghman was admitted to the hospital with the working diagnosis of heart failure exacerbation.   72 yo female with the past medical history of hypertension, dyslipidemia, T2DM, CKD, atrial fibrillation and heart failure who presented with dyspnea and wheezing. Reported few days of worsening dyspnea and wheezing, along with PND and lower extremity edema. Positive weight gain 13 lbs over last 6 months. On the day of  hospitalization her 02 saturation was 90%, and she was brought to the hospital. On her initial physical examination her blood pressure was 124/70, HR 85, RR 19 and 02 saturation 94% on 4 L/min per , lungs with right lower lobes rales with no wheezing, heart with S1 and S2 present and regular with systolic murmur 3/6, abdomen with no distention and positive lower extremity edema.   Na 121, K 5,1 Cl 90 bicarbonate 18, glucose 108 bun 44 cr 2,15  AST 72 and ALT 43  BNP 663  High sensitive troponin 15 and 11  Wbc 18,7 hgb 9,7 plt 271  Sars covid 19 negative  Viral respiratory panel negative  Urine analysis SG 1,008, protein 100, glucose 50, small leukocytes.   Chest radiograph with cardiomegaly, bilateral hilar vascular congestion, cephalization of the vasculature and bilateral central interstitial infiltrates, small bilateral pleural effusions.   EKG 84 bpm, left axis deviation, interventricular conduction delay, sinus rhythm with no significant ST segment changes, negative T wave lead I and AvL.   Assessment and Plan: * Acute on chronic diastolic CHF (congestive heart failure) ([place]) 2019 echocardiogram with preserved LV systolic function, mild MR.  Patient follows with Dr Virgina Jock from cardiology, echocardiogram from 03/2022 with preserved LV systolic function, moderate aortic stenosis, moderate MR.  (Old records personally reviewed).   Urine output is [id number] cc  Systolic blood pressure is 127 to 128 mmHg.   Plan to continue diuresis with furosemide 40 mg IV q12 hrs.  Blood pressure monitoring.  Limited pharmacologic therapy due to reduced GFR.  Follow up with new limited echocardiogram. (Patient had no recent change in her lifestyle or medical regimen).    Acute hypoxemic respiratory failure, due to cardiogenic pulmonary edema.  Reactive leukocytosis.  Chest radiograph with no signs of mnemonic infiltrate. Plan to discontinue antibiotic therapy, pneumonia has been ruled out.   Acute renal failure superimposed on stage 3b chronic kidney disease (HCC) Hyponatremia.  Patient continue with hypervolemia.  Renal function with serum cr at 2,0 with K at 3,5 and serum bicarbonate at 21. Na 126 and BUN 42.   Plan to continue diuresis with furosemide 40 mg IV q12 hrs.  Add 40 meq Kcl to prevent hypokalemia.  At home patient on torsemide and dapagliflozin.   PAF (paroxysmal atrial fibrillation) (HCC) Continue rate control with metoprolol. Continue anticoagulation with apixaban.   Normocytic anemia Follow up hgb is 9.2  Positive iron deficiency anemia with serum iron of 23, TIBC 276, ferritin 44 and transferrin saturation of 8. Will plan for IV iron during this hospitalization after completing cardiac work up.   Essential hypertension Continue blood pressure monitoring. Aggressive diuresis with loop diuretic.  Blood pressure control with amlodipine and metoprolol.  Hold on losartan.  Type 2 diabetes mellitus with hyperlipidemia (HCC) Uncontrolled with hyperglycemia.   Fasting glucose this am 235 mg/dl,.   Plan to continue insulin sliding scale for  glucose cover and monitoring.  Patient is tolerating po well.   Continue with statin therapy.   Class 1 obesity Calculated BMI is 34,2   Subjective: Patient with improvement in dyspnea but not back to baseline, continue to  have congestion., no chest pain.   Physical Exam: Vitals:   08/19/22 2213 08/20/22 0546 08/20/22 0740 08/20/22 1210  BP: (!) 131/56 (!) 121/51 128/64 127/65  Pulse: 84 83 83 77  Resp: 20 (!) 22 (!) 23 (!) 21  Temp: 99.4 F (37.4 C) 99.2 F (37.3 C) 98.9 F (37.2 C) 99.1 F (37.3 C)  TempSrc: Oral Oral Oral Oral  SpO2: 92% 95% 90% 95%  Weight:  87.8 kg    Height:       Neurology awake and alert ENT with mild pallor Cardiovascular with S1 and S2 present and regular with no gallops, or rubs, positive systolic murmur at the apex Moderate JVD Trace lower extremity edema Respiratory with prolonged expiratory phase and expiratory wheezing, no rales or rhonchi Abdomen with no distention  Data Reviewed:   Family Communication: I spoke with patient's daughter (husband over the phone) at the bedside, we talked in detail about patient's condition, plan of care and prognosis and all questions were addressed.  Disposition: Status is: Inpatient Remains inpatient appropriate because: heart failure   Planned Discharge Destination: Home  Author: Tawni Millers, MD 08/20/2022 1:11 PM  For on call review [email or web address].

## 2022-08-20 NOTE — Assessment & Plan Note (Addendum)
Echocardiogram from 03/2022 with preserved LV systolic function, moderate aortic stenosis, moderate MR. (Old records personally reviewed).  Limited echocardiogram with LV EF 55 to 123456, RV systolic function preserved, moderate aortic stenosis. Mild to moderate mitral regurgitation.   Patient had furosemide IV for diuresis, negative fluid balance was achieved, -8,669 ml, with significant improvement in her symptoms.   Patient will continue diuresis with torsemide 40 mg in am and 20 mg in pm.  Continue metoprolol and blood pressure control with amlodipine.  Hold on losartan and SGLT 2 inh for now.  Limited pharmacologic therapy due to reduced GFR.  Follow up with cardiology as outpatient.   Acute hypoxemic respiratory failure, due to cardiogenic pulmonary edema. Pneumonia was ruled out. (Reactive leukocytosis improving).   At the time of her discharge her oxygenation improved, at 97% on room air with no 02 desaturation below 88% on ambulation.

## 2022-08-21 ENCOUNTER — Telehealth: Payer: Self-pay

## 2022-08-21 ENCOUNTER — Inpatient Hospital Stay (HOSPITAL_COMMUNITY): Payer: Medicare HMO

## 2022-08-21 ENCOUNTER — Ambulatory Visit: Payer: Medicare HMO | Admitting: Cardiology

## 2022-08-21 DIAGNOSIS — I10 Essential (primary) hypertension: Secondary | ICD-10-CM | POA: Diagnosis not present

## 2022-08-21 DIAGNOSIS — N179 Acute kidney failure, unspecified: Secondary | ICD-10-CM | POA: Diagnosis not present

## 2022-08-21 DIAGNOSIS — D649 Anemia, unspecified: Secondary | ICD-10-CM | POA: Diagnosis not present

## 2022-08-21 DIAGNOSIS — I5033 Acute on chronic diastolic (congestive) heart failure: Secondary | ICD-10-CM | POA: Diagnosis not present

## 2022-08-21 LAB — GLUCOSE, CAPILLARY
Glucose-Capillary: 268 mg/dL — ABNORMAL HIGH (ref 70–99)
Glucose-Capillary: 345 mg/dL — ABNORMAL HIGH (ref 70–99)
Glucose-Capillary: 358 mg/dL — ABNORMAL HIGH (ref 70–99)
Glucose-Capillary: 217 mg/dL — ABNORMAL HIGH (ref 70–99)
Glucose-Capillary: 277 mg/dL — ABNORMAL HIGH (ref 70–99)

## 2022-08-21 LAB — BASIC METABOLIC PANEL
Anion gap: 9 (ref 5–15)
BUN: 45 mg/dL — ABNORMAL HIGH (ref 8–23)
CO2: 18 mmol/L — ABNORMAL LOW (ref 22–32)
Calcium: 8.2 mg/dL — ABNORMAL LOW (ref 8.9–10.3)
Chloride: 98 mmol/L (ref 98–111)
Creatinine, Ser: 2.1 mg/dL — ABNORMAL HIGH (ref 0.44–1.00)
GFR, Estimated: 25 mL/min — ABNORMAL LOW (ref >60)
Glucose, Bld: 280 mg/dL — ABNORMAL HIGH (ref 70–99)
Potassium: 3.8 mmol/L (ref 3.5–5.1)
Sodium: 125 mmol/L — ABNORMAL LOW (ref 135–145)

## 2022-08-21 LAB — ECHOCARDIOGRAM LIMITED
AR max vel: 1.11 cm2
AV Area VTI: 1.05 cm2
AV Area mean vel: 1.08 cm2
AV Mean grad: 18 mmHg
AV Peak grad: 29.4 mmHg
Ao pk vel: 2.71 m/s
Area-P 1/2: 2.67 cm2
Height: 63 in
Weight: 2994.73 oz

## 2022-08-21 LAB — MAGNESIUM: Magnesium: 2.4 mg/dL (ref 1.7–2.4)

## 2022-08-21 MED ORDER — INSULIN GLARGINE-YFGN 100 UNIT/ML ~~LOC~~ SOLN: 15.0000 [IU] | Freq: Every day | SUBCUTANEOUS | Status: DC

## 2022-08-21 MED ORDER — BISACODYL 5 MG PO TBEC
10.0000 mg | DELAYED_RELEASE_TABLET | Freq: Once | ORAL | Status: AC
  Administered 2022-08-21: 10 mg via ORAL
  Filled 2022-08-21: qty 2

## 2022-08-21 MED ORDER — INSULIN GLARGINE-YFGN 100 UNIT/ML ~~LOC~~ SOLN
15.0000 [IU] | Freq: Every day | SUBCUTANEOUS | Status: DC
  Administered 2022-08-21 – 2022-08-22 (×2): 15 [IU] via SUBCUTANEOUS
  Filled 2022-08-21 (×2): qty 0.15

## 2022-08-21 MED ORDER — POLYETHYLENE GLYCOL 3350 17 G PO PACK
17.0000 g | PACK | Freq: Two times a day (BID) | ORAL | Status: DC
  Administered 2022-08-21: 17 g via ORAL
  Filled 2022-08-21: qty 1

## 2022-08-21 MED ORDER — GABAPENTIN 300 MG PO CAPS
300.0000 mg | ORAL_CAPSULE | Freq: Two times a day (BID) | ORAL | Status: DC
  Administered 2022-08-21 – 2022-08-22 (×2): 300 mg via ORAL
  Filled 2022-08-21 (×2): qty 1

## 2022-08-21 NOTE — Progress Notes (Signed)
  Transition of Care [facility]) Screening Note   Patient Details  Name: AERO MANASSE Date of Birth: Aug 23, 1950   Transition of Care [facility]) CM/SW Contact:    Cyndi Bender, RN Phone Number: 08/21/2022, 9:05 AM    Transition of Care Department [facility]) has reviewed patient and no TOC needs have been identified at this time. We will continue to monitor patient advancement through interdisciplinary progression rounds. If new patient transition needs arise, please place a TOC consult.

## 2022-08-21 NOTE — Telephone Encounter (Signed)
Patient is still admitted in the hospital.  

## 2022-08-21 NOTE — Inpatient Diabetes Management (Addendum)
Inpatient Diabetes Program Recommendations  AACE/ADA: New Consensus Statement on Inpatient Glycemic Control (2015)  Target Ranges:  Prepandial:   less than 140 mg/dL      Peak postprandial:   less than 180 mg/dL (1-2 hours)      Critically ill patients:  140 - 180 mg/dL   Lab Results  Component Value Date   GLUCAP 268 (H) 08/21/2022    Latest Reference Range & Units 08/20/22 05:57 08/20/22 11:42 08/20/22 16:37 08/20/22 21:17 08/21/22 06:01  Glucose-Capillary 70 - 99 mg/dL 246 (H) Novolog 3 units @ 0754 am 220 (H) Novolog 3 units  234 (H) Novolog 3 units  306 (H) 268 (H)  (H): Data is abnormally high  Diabetes history: DM2 Outpatient Diabetes medications: Lantus 30 units qd, Farxiga 10 mg qd, Metformin 1 gm bid Current orders for Inpatient glycemic control: Novolog 0-9 units tid correction  Inpatient Diabetes Program Recommendations:   CBGs ranging 246-306 over the past 24 hrs. Please consider: -Add Semglee 15 units qd (50% home basal insulin dose) -Add Novolog 0-5 units hs correction -Add Novolog 3 units tid meal coverage if eats 50% -A1c to determine prehospital glycemic control  Thank you, Danielle Roys E. Pelagia Iacobucci, RN, MSN, CDE  Diabetes Coordinator Inpatient Glycemic Control Team Team Pager [phone] (8am-5pm) 08/21/2022 10:35 AM

## 2022-08-21 NOTE — Progress Notes (Signed)
Echocardiogram 2D Echocardiogram has been performed.  Oneal Deputy Boykin Baetz RDCS 08/21/2022, 11:17 AM

## 2022-08-21 NOTE — Evaluation (Signed)
Physical Therapy Evaluation Patient Details Name: Danielle Beck MRN: [id number] DOB: 1950/06/14 Today's Date: 08/21/2022  History of Present Illness  Pt is a 72 y.o. female admitted 3/23 with acute on chronic diastolic heart failure. PMH:  HTN, dyslipidemia, T2DM, CKD, a fib, and heart failure   Clinical Impression  Pt admitted with above diagnosis. PTA pt lived at home with husband, mod I mobility household distances. Pt currently with functional limitations due to the deficits listed below (see PT Problem List). On eval, pt required min assist transfers, and min/HHA amb 25'. SpO2 98% at rest on 2L. Mobilized on RA with SpO2 96%. Pt remained on RA at end of session. Mobility limited by fatigue and abdominal pain. Pt on BSC just prior to PT eval, straining to have BM with minimal success. Pt with continued c/o constipation. Pt will benefit from acute skilled PT to increase their independence and safety with mobility to allow discharge.          Recommendations for follow up therapy are one component of a multi-disciplinary discharge planning process, led by the attending physician.  Recommendations may be updated based on patient status, additional functional criteria and insurance authorization.  Follow Up Recommendations       Assistance Recommended at Discharge Intermittent Supervision/Assistance  Patient can return home with the following  A little help with walking and/or transfers;A little help with bathing/dressing/bathroom;Assist for transportation;Assistance with cooking/housework    Equipment Recommendations None recommended by PT  Recommendations for Other Services       Functional Status Assessment Patient has had a recent decline in their functional status and demonstrates the ability to make significant improvements in function in a reasonable and predictable amount of time.     Precautions / Restrictions Precautions Precautions: Other (comment) Precaution Comments:  watch sats      Mobility  Bed Mobility               General bed mobility comments: Pt received in recliner.    Transfers Overall transfer level: Needs assistance Equipment used: 1 person hand held assist Transfers: Sit to/from Stand Sit to Stand: Min assist           General transfer comment: increased time to power up    Ambulation/Gait Ambulation/Gait assistance: Min assist Gait Distance (Feet): 25 Feet Assistive device: 1 person hand held assist Gait Pattern/deviations: Step-through pattern, Decreased stride length Gait velocity: decreased     General Gait Details: Amb distance limited by abdominal pain from constipation. Mobilized on RA with SpO2 96%.  Stairs            Wheelchair Mobility    Modified Rankin (Stroke Patients Only)       Balance Overall balance assessment: Mild deficits observed, not formally tested                                           Pertinent Vitals/Pain Pain Assessment Pain Assessment: Faces Faces Pain Scale: Hurts even more Pain Location: abdomen due to constipation Pain Descriptors / Indicators: Discomfort, Grimacing Pain Intervention(s): Monitored during session, Repositioned, Limited activity within patient's tolerance    Home Living Family/patient expects to be discharged to:: Private residence Living Arrangements: Spouse/significant other Available Help at Discharge: Family;Available 24 hours/day Type of Home: House         Home Layout: One level Home Equipment: Cane - single point;Shower seat;Transport  chair      Prior Function Prior Level of Function : Independent/Modified Independent             Mobility Comments: household ambulator, occasional use of cane. Transport chair for increased distances in the community. ADLs Comments: Pt does the cooking. Pt/spouse have housekeeper for cleaning.     Hand Dominance        Extremity/Trunk Assessment   Upper Extremity  Assessment Upper Extremity Assessment: Defer to OT evaluation    Lower Extremity Assessment Lower Extremity Assessment: Generalized weakness    Cervical / Trunk Assessment Cervical / Trunk Assessment: Normal  Communication   Communication: No difficulties  Cognition Arousal/Alertness: Awake/alert Behavior During Therapy: WFL for tasks assessed/performed Overall Cognitive Status: Within Functional Limits for tasks assessed                                          General Comments General comments (skin integrity, edema, etc.): SpO2 98% on 2L at rest. Mobilized on RA with SpO2 96%. Pt remained on RA at end of session. RN notified.    Exercises     Assessment/Plan    PT Assessment Patient needs continued PT services  PT Problem List Decreased strength;Decreased balance;Decreased mobility;Decreased activity tolerance       PT Treatment Interventions Functional mobility training;Balance training;Patient/family education;Gait training;Therapeutic activities;Therapeutic exercise    PT Goals (Current goals can be found in the Care Plan section)  Acute Rehab PT Goals Patient Stated Goal: home PT Goal Formulation: With patient/family Time For Goal Achievement: 09/04/22 Potential to Achieve Goals: Good    Frequency Min 1X/week     Co-evaluation               AM-PAC PT "6 Clicks" Mobility  Outcome Measure Help needed turning from your back to your side while in a flat bed without using bedrails?: A Little Help needed moving from lying on your back to sitting on the side of a flat bed without using bedrails?: A Little Help needed moving to and from a bed to a chair (including a wheelchair)?: A Little Help needed standing up from a chair using your arms (e.g., wheelchair or bedside chair)?: A Little Help needed to walk in hospital room?: A Little Help needed climbing 3-5 steps with a railing? : A Lot 6 Click Score: 17    End of Session Equipment  Utilized During Treatment: Gait belt Activity Tolerance: Patient limited by pain;Patient limited by fatigue Patient left: with call bell/phone within reach;with family/visitor present;Other (comment) (on [facility]) Nurse Communication: Mobility status;Other (comment) (Pt on BSC. Pt on RA at end of session, SpO2 96%.) PT Visit Diagnosis: Muscle weakness (generalized) (M62.81);Difficulty in walking, not elsewhere classified (R26.2)    Time: [id number] PT Time Calculation (min) (ACUTE ONLY): 16 min   Charges:   PT Evaluation $PT Eval Moderate Complexity: 1 Mod          Gloriann Loan., PT  Office # [phone]   Lorriane Shire 08/21/2022, 10:41 AM

## 2022-08-21 NOTE — Consult Note (Signed)
CARDIOLOGY CONSULT NOTE  Patient ID: Danielle Beck MRN: [id number] DOB/AGE: 1950-11-21 72 y.o.  Admit date: 08/19/2022 Referring Physician: [facility] hospitalist Reason for Consultation:  CHF  HPI:   72 y.o. female with hypertension, diabetes mellitus, HFpEF, paroxysmal Afib/flutter, PSVT, mod AS, mod MR< admitted with acute on chronic HFpEF.  Patient presented on 08/19/2022 with worsening shortness of breath and leg edema. On presentation, she was hypoxic, requiring 4 L O2. Over the next two days. She has been diuresed very well with IV lasix, net -ve 7L. Breathing is improved but not back to baseline. Leg edema has resolved. Cr 2/15-->2.06-->2.1.   Past Medical History:  Diagnosis Date   CHF (congestive heart failure) (HCC)    Diabetes mellitus without complication ([place])    Hyperlipidemia    Hypertension      Past Surgical History:  Procedure Laterality Date   CATARACT EXTRACTION     TUBAL LIGATION  1985      Family History  Problem Relation Age of Onset   Diabetes Mother    Heart failure Father    Diabetes Brother    Diabetes Brother    Diabetes Brother    Hypertension Brother    Breast cancer Neg Hx      Social History: Social History   Socioeconomic History   Marital status: Married    Spouse name: Not on file   Number of children: 3   Years of education: Not on file   Highest education level: Not on file  Occupational History   Not on file  Tobacco Use   Smoking status: Never   Smokeless tobacco: Never  Vaping Use   Vaping Use: Never used  Substance and Sexual Activity   Alcohol use: Not Currently   Drug use: Not Currently   Sexual activity: Not on file  Other Topics Concern   Not on file  Social History Narrative   Not on file   Social Determinants of Health   Financial Resource Strain: Not on file  Food Insecurity: Not on file  Transportation Needs: Not on file  Physical Activity: Not on file  Stress: Not on file  Social Connections: Not  on file  Intimate Partner Violence: Not on file     Medications Prior to Admission  Medication Sig Dispense Refill Last Dose   amLODipine (NORVASC) 10 MG tablet Take 10 mg by mouth daily.    Past Week   atorvastatin (LIPITOR) 10 MG tablet Take 10 mg by mouth daily.   Past Week   calcium-vitamin D (OSCAL WITH D) 250-125 MG-UNIT tablet Take 1 tablet by mouth 2 (two) times daily.    Past Week   dapagliflozin propanediol (FARXIGA) 10 MG TABS tablet Take 10 mg by mouth daily.   Past Week   dorzolamide (TRUSOPT) 2 % ophthalmic solution Place 1 drop into both eyes 2 (two) times daily.   Past Week   Ferrous Sulfate (IRON) 325 (65 Fe) MG TABS Take 325 mg by mouth daily.   Past Week   gabapentin (NEURONTIN) 400 MG capsule Take 800 mg by mouth 3 (three) times daily.   Past Week   LANTUS SOLOSTAR 100 UNIT/ML Solostar Pen Inject 30 Units into the skin daily.   Past Week   latanoprost (XALATAN) 0.005 % ophthalmic solution Place 1 drop into both eyes at bedtime.    Past Week   losartan (COZAAR) 100 MG tablet TAKE 1 TABLET EVERY DAY (Patient taking differently: Take 100 mg by mouth daily.) 90 tablet  3 Past Week   metFORMIN (GLUCOPHAGE) 1000 MG tablet Take 1 tablet by mouth in the morning and at bedtime.   Past Week   methocarbamol (ROBAXIN) 500 MG tablet Take 500-1,000 mg by mouth every 6 (six) hours as needed for muscle spasms.   Past Week   metoprolol tartrate (LOPRESSOR) 25 MG tablet TAKE 1 TABLET TWICE DAILY (Patient taking differently: Take 25 mg by mouth 2 (two) times daily.) 180 tablet 3 Past Week   nitroGLYCERIN (NITROSTAT) 0.4 MG SL tablet Place 1 tablet (0.4 mg total) under the tongue every 5 (five) minutes as needed for chest pain. 30 tablet 3    pyridOXINE (VITAMIN B-6) 100 MG tablet Take 100 mg by mouth daily.   Past Week   Rivaroxaban (XARELTO) 15 MG TABS tablet Take 1 tablet (15 mg total) by mouth daily with supper. 90 tablet 3 Past Week   thiamine (VITAMIN B-1) 100 MG tablet Take 100 mg by  mouth daily.   Past Week   timolol (TIMOPTIC) 0.5 % ophthalmic solution Place 1 drop into both eyes 2 (two) times daily.   Past Week   torsemide (DEMADEX) 20 MG tablet TAKE 1 TABLET EVERY DAY AND 1 ADDITIONAL TABLET IF NEEDED FOR LEG SWELLING AS DIRECTED (Patient taking differently: Take 20 mg by mouth 2 (two) times daily.) 180 tablet 3 Past Week   traMADol (ULTRAM) 50 MG tablet Take 50 mg by mouth every 6 (six) hours as needed for moderate pain.   Past Week   vitamin B-12 (CYANOCOBALAMIN) 100 MCG tablet Take 100 mcg by mouth daily.   Past Week    Review of Systems  Cardiovascular:  Positive for dyspnea on exertion (Improving). Negative for chest pain, leg swelling, palpitations and syncope.      Physical Exam: Physical Exam Vitals and nursing note reviewed.  Constitutional:      General: She is not in acute distress. Neck:     Vascular: No JVD.  Cardiovascular:     Rate and Rhythm: Normal rate and regular rhythm.     Heart sounds: Murmur heard.     Harsh midsystolic murmur is present with a grade of 3/6 at the upper right sternal border radiating to the neck.     High-pitched blowing holosystolic murmur of grade 3/6 is also present at the apex.  Pulmonary:     Effort: Pulmonary effort is normal.     Breath sounds: Examination of the right-lower field reveals rales. Examination of the left-lower field reveals rales. Rales present. No wheezing.  Musculoskeletal:     Right lower leg: No edema.     Left lower leg: No edema.        Imaging/tests reviewed and independently interpreted: Lab Results: CBC, BMP, BNP  CXR 08/21/2022: Borderline to mild cardiomegaly with vascular congestion and mild interstitial edema. Probable small effusions. Findings do not appear significantly changed     Cardiac Studies:  Telemetry 08/21/2022: No significant arrhythmia  EKG 08/21/2022: Sinus rhythm with 1st degree A-V block Left axis deviation Left ventricular hypertrophy with  repolarization abnormality ( R in aVL , Cornell product ) Cannot rule out Septal infarct , age undetermined Abnormal ECG When compared with ECG of 19-Aug-2022 04:20,  Echocardiogram 08/21/2022:  1. Left ventricular ejection fraction, by estimation, is 55 to 60%. The  left ventricle has normal function. The left ventricle has no regional  wall motion abnormalities. Left ventricular diastolic parameters are  indeterminate.   2. Right ventricular systolic function is normal. The  right ventricular  size is normal.   3. The aortic valve is calcified. Moderate aortic valve stenosis. Aortic  valve area, by VTI measures 1.05 cm. Aortic valve mean gradient measures  18.0 mmHg. Aortic valve Vmax measures 2.71 m/s.   4. The inferior vena cava is normal in size with <50% respiratory  variability, suggesting right atrial pressure of 8 mmHg.   Comparison(s): A prior study was performed on 04/06/2022. AS and MR are no  worse than before.    Assessment & Recommendations:   72 y.o. female with hypertension, diabetes mellitus, HFpEF, paroxysmal Afib/flutter, PSVT, mod AS, mod MR< admitted with acute on chronic HFpEF.  Acute on chronic HFpEF: Likely increasing diuretic requirement cause of presentation. AS, MR do not appear severe. Continue IV diuresis with IV lasxi, as you are doing. Continued to hold ARB for now.  Re-assess volume status, especially rales in am.  Continue I/O, daily weight checks.   Paroxysmal Afib/flutter + PSVT: Improved. CHA2DS2VASc score 4, annual stroke risk 5%. Continue Xarelto 15 mg daily, given GFR <50. Stable normocytic anemia   Hypertension: Controlled    Discussed interpretation of tests and management recommendations with the primary team     Nigel Mormon, MD Pager: [phone] Office: [phone]

## 2022-08-21 NOTE — Evaluation (Signed)
Occupational Therapy Evaluation Patient Details Name: Danielle Beck MRN: [id number] DOB: 10-23-1950 Today's Date: 08/21/2022   History of Present Illness Pt is a 72 y.o. female admitted 3/23 with acute on chronic diastolic heart failure. PMH:  HTN, dyslipidemia, T2DM, CKD, a fib, and heart failure   Clinical Impression   Pt reports independent at baseline with ADLs and functional mobility, does report having a housekeeper come 1x/month to clean. Pt uses cane at baseline for mobility. Pt currently needing min-max A for ADLs, min guard for bed mobility, and min A for transfers/in-room ambulation with RW. SpO2 down to 90% on RA with short distance ambulation in room, pt with 1/4 DOE. Cues for breathing technique. Pt presenting with impairments listed below, will follow acutely. Pt would benefit from continued OT services with in the home at d/c to maximize safety and independence with ADLs/mobility.      Recommendations for follow up therapy are one component of a multi-disciplinary discharge planning process, led by the attending physician.  Recommendations may be updated based on patient status, additional functional criteria and insurance authorization.   Assistance Recommended at Discharge Intermittent Supervision/Assistance  Patient can return home with the following A little help with walking and/or transfers;A little help with bathing/dressing/bathroom;Assistance with cooking/housework;Direct supervision/assist for medications management;Help with stairs or ramp for entrance;Direct supervision/assist for financial management;Assist for transportation    Functional Status Assessment  Patient has had a recent decline in their functional status and demonstrates the ability to make significant improvements in function in a reasonable and predictable amount of time.  Equipment Recommendations  Other (comment) (RW)    Recommendations for Other Services PT consult     Precautions /  Restrictions Precautions Precautions: Other (comment) Precaution Comments: watch sats Restrictions Weight Bearing Restrictions: No      Mobility Bed Mobility Overal bed mobility: Needs Assistance Bed Mobility: Supine to Sit     Supine to sit: Min guard          Transfers Overall transfer level: Needs assistance Equipment used: Rolling walker (2 wheels) Transfers: Sit to/from Stand Sit to Stand: Min assist                  Balance Overall balance assessment: Mild deficits observed, not formally tested                                         ADL either performed or assessed with clinical judgement   ADL Overall ADL's : Needs assistance/impaired Eating/Feeding: Set up;Sitting   Grooming: Min guard;Sitting;Standing   Upper Body Bathing: Minimal assistance;Sitting   Lower Body Bathing: Moderate assistance;Sitting/lateral leans   Upper Body Dressing : Minimal assistance;Sitting   Lower Body Dressing: Moderate assistance   Toilet Transfer: Minimal assistance;Ambulation;Regular Toilet;Rolling walker (2 wheels)   Toileting- Clothing Manipulation and Hygiene: Maximal assistance Toileting - Clothing Manipulation Details (indicate cue type and reason): catheter     Functional mobility during ADLs: Minimal assistance;Rolling walker (2 wheels)       Vision   Vision Assessment?: No apparent visual deficits     Perception Perception Perception Tested?: No   Praxis Praxis Praxis tested?: Not tested    Pertinent Vitals/Pain Pain Assessment Pain Assessment: No/denies pain     Hand Dominance Right   Extremity/Trunk Assessment Upper Extremity Assessment Upper Extremity Assessment: Generalized weakness   Lower Extremity Assessment Lower Extremity Assessment: Defer to PT evaluation  Communication Communication Communication: No difficulties   Cognition Arousal/Alertness: Awake/alert Behavior During Therapy: WFL for tasks  assessed/performed Overall Cognitive Status: Within Functional Limits for tasks assessed                                       General Comments  SpO2 down to 90% on RA, cues for breathing technique to return to mid-high 90s    Exercises     Shoulder Instructions      Home Living Family/patient expects to be discharged to:: Private residence Living Arrangements: Spouse/significant other Available Help at Discharge: Family;Available 24 hours/day Type of Home: House Home Access: Level entry     Home Layout: Two level;Bed/bath upstairs Alternate Level Stairs-Number of Steps: flight   Bathroom Shower/Tub: Teacher, early years/pre: Standard     Home Equipment: Cane - single point;Shower seat;Transport chair   Additional Comments: does not wear O2 at home      Prior Functioning/Environment Prior Level of Function : Independent/Modified Independent             Mobility Comments: household ambulator, occasional use of cane. Transport chair for increased distances in the community. ADLs Comments: Pt does the cooking. Pt/spouse have housekeeper for cleaning.        OT Problem List: Decreased strength;Decreased range of motion;Decreased activity tolerance;Impaired balance (sitting and/or standing);Cardiopulmonary status limiting activity      OT Treatment/Interventions: Self-care/ADL training;Therapeutic exercise;Energy conservation;DME and/or AE instruction;Therapeutic activities;Balance training;Patient/family education    OT Goals(Current goals can be found in the care plan section) ADL Goals Pt Will Perform Upper Body Dressing: with modified independence;sitting Pt Will Perform Lower Body Dressing: with modified independence;sit to/from stand;sitting/lateral leans Pt Will Transfer to Toilet: with supervision;ambulating;regular height toilet Pt Will Perform Tub/Shower Transfer: Tub transfer;Shower transfer;with supervision;ambulating;shower seat   OT Frequency: Min 2X/week    Co-evaluation              AM-PAC OT "6 Clicks" Daily Activity     Outcome Measure Help from another person eating meals?: None Help from another person taking care of personal grooming?: A Little Help from another person toileting, which includes using toliet, bedpan, or urinal?: A Lot Help from another person bathing (including washing, rinsing, drying)?: A Lot Help from another person to put on and taking off regular upper body clothing?: A Little Help from another person to put on and taking off regular lower body clothing?: A Lot 6 Click Score: 16   End of Session Equipment Utilized During Treatment: Gait belt;Rolling walker (2 wheels) Nurse Communication: Mobility status  Activity Tolerance: Patient tolerated treatment well Patient left: in chair;with call bell/phone within reach;with family/visitor present  OT Visit Diagnosis: Unsteadiness on feet (R26.81);Other abnormalities of gait and mobility (R26.89);Muscle weakness (generalized) (M62.81)                Time: [id number] OT Time Calculation (min): 20 min Charges:  OT General Charges $OT Visit: 1 Visit OT Evaluation $OT Eval Moderate Complexity: 1 Mod  Danielle Beck, OTD, OTR/L SecureChat Preferred Acute Rehab [phone]   Danielle Beck 08/21/2022, 2:47 PM

## 2022-08-21 NOTE — Progress Notes (Signed)
Heart Failure Navigator Progress Note  Assessed for Heart & Vascular TOC clinic readiness.  Patient does not meet criteria due to [facility] patient.   Navigator will sign off at this time.    Azhane Eckart, BSN, RN Heart Failure Nurse Navigator Secure Chat Only   

## 2022-08-21 NOTE — Progress Notes (Signed)
Pt called and stated that she was having some chest pressure- upon entering her room her O2 was 87% on room air. BP 130/53- temp was 98.13F. Placed pt back on 2L [place], obtained EKG- Within a few mins patient stated that she was feeling much better. No more chest pressure. Advised pt to call immediately if this happens again. Will continue to monitor closely.

## 2022-08-21 NOTE — Progress Notes (Signed)
Progress Note   Patient: Danielle Beck [id number] DOB: 1950/10/04 DOA: 08/19/2022     2 DOS: the patient was seen and examined on 08/21/2022   Brief hospital course: Danielle Beck was admitted to the hospital with the working diagnosis of heart failure exacerbation.   72 yo female with the past medical history of hypertension, dyslipidemia, T2DM, CKD, atrial fibrillation and heart failure who presented with dyspnea and wheezing. Reported few days of worsening dyspnea and wheezing, along with PND and lower extremity edema. Positive weight gain 13 lbs over last 6 months. On the day of  hospitalization her 02 saturation was 90%, and she was brought to the hospital. On her initial physical examination her blood pressure was 124/70, HR 85, RR 19 and 02 saturation 94% on 4 L/min per [place], lungs with right lower lobes rales with no wheezing, heart with S1 and S2 present and regular with systolic murmur 3/6, abdomen with no distention and positive lower extremity edema.   Na 121, K 5,1 Cl 90 bicarbonate 18, glucose 108 bun 44 cr 2,15  AST 72 and ALT 43  BNP 663  High sensitive troponin 15 and 11  Wbc 18,7 hgb 9,7 plt 271  Sars covid 19 negative  Viral respiratory panel negative  Urine analysis SG 1,008, protein 100, glucose 50, small leukocytes.   Chest radiograph with cardiomegaly, bilateral hilar vascular congestion, cephalization of the vasculature and bilateral central interstitial infiltrates, small bilateral pleural effusions.   EKG 84 bpm, left axis deviation, interventricular conduction delay, sinus rhythm with no significant ST segment changes, negative T wave lead I and AvL.   Assessment and Plan: * Acute on chronic diastolic CHF (congestive heart failure) ([place]) 2019 echocardiogram with preserved LV systolic function, mild MR.  Patient follows with Dr Virgina Jock from cardiology, echocardiogram from 03/2022 with preserved LV systolic function, moderate aortic stenosis, moderate MR.  (Old records personally reviewed).   Urine output is [id number]  cc  Systolic blood pressure is 124 to 118 mmHg.   Plan to continue diuresis with furosemide 40 mg IV q12 hrs.  Limited pharmacologic therapy due to reduced GFR.  Pending limited echocardiogram.   Acute hypoxemic respiratory failure, due to cardiogenic pulmonary edema.  02 saturation is 92% on 2 L/min per [place].  Reactive leukocytosis, today down to 12,8   Chest radiograph with no signs of mnemonic infiltrate. Pneumonia has been ruled out.  Antibiotics have been discontinued.   Acute renal failure superimposed on stage 3b chronic kidney disease (HCC) Hyponatremia.  Renal function with serum cr at 2,10 with K at 3,8 and serum bicarbonate at 18,  Na 125 and Mg at 2.4   Plan to continue diuresis with furosemide 40 mg IV q12 hrs.   At home patient on torsemide and dapagliflozin.   PAF (paroxysmal atrial fibrillation) (HCC) Continue rate control with metoprolol. Continue anticoagulation with apixaban.   Normocytic anemia Follow up hgb is 9.2  Positive iron deficiency anemia with serum iron of 23, TIBC 276, ferritin 44 and transferrin saturation of 8. Will plan for IV iron during this hospitalization after completing cardiac work up.   Essential hypertension Continue blood pressure monitoring. Aggressive diuresis with loop diuretic.  Blood pressure control with amlodipine and metoprolol.  Hold on losartan.  Type 2 diabetes mellitus with hyperlipidemia (HCC) Uncontrolled with hyperglycemia.   Fasting glucose this am 280 mg/dl,.   Plan to continue insulin sliding scale for glucose cover and monitoring.  Patient is tolerating po well.   Continue  with statin therapy.   Class 1 obesity Calculated BMI is 34,2        Subjective: Patient with improvement in dyspnea but not yet back to baseline, no chest pain, continue to use supplemental 02 per [place]   Physical Exam: Vitals:   08/20/22 1959 08/20/22 2306 08/21/22  0603 08/21/22 0727  BP:   (!) 118/56 (!) 124/55  Pulse:    75  Resp:    19  Temp: 99.8 F (37.7 C) 98 F (36.7 C) 98.3 F (36.8 C) 98.6 F (37 C)  TempSrc: Oral Axillary Oral Oral  SpO2:    94%  Weight:   84.9 kg   Height:       Neurology awake and alert ENT with mild pallor Cardiovascular with S1 and S2 present and regular with no gallops, positive systolic murmur at the apex, no rubs.  Positive moderate JVD Trace lower extremity edema Respiratory with rales bilaterally with no wheezing or rhonchi Abdomen with no distention  Data Reviewed:    Family Communication: no family at the bedside   Disposition: Status is: Inpatient Remains inpatient appropriate because: IV diuresis.   Planned Discharge Destination: Home      Author: Tawni Millers, MD 08/21/2022 8:53 AM  For on call review [email or web address].

## 2022-08-21 NOTE — Telephone Encounter (Signed)
From patient.

## 2022-08-22 ENCOUNTER — Other Ambulatory Visit (HOSPITAL_COMMUNITY): Payer: Self-pay

## 2022-08-22 DIAGNOSIS — D649 Anemia, unspecified: Secondary | ICD-10-CM | POA: Diagnosis not present

## 2022-08-22 DIAGNOSIS — N179 Acute kidney failure, unspecified: Secondary | ICD-10-CM | POA: Diagnosis not present

## 2022-08-22 DIAGNOSIS — I480 Paroxysmal atrial fibrillation: Secondary | ICD-10-CM

## 2022-08-22 DIAGNOSIS — I5033 Acute on chronic diastolic (congestive) heart failure: Secondary | ICD-10-CM | POA: Diagnosis not present

## 2022-08-22 LAB — BASIC METABOLIC PANEL
Anion gap: 13 (ref 5–15)
BUN: 51 mg/dL — ABNORMAL HIGH (ref 8–23)
CO2: 22 mmol/L (ref 22–32)
Calcium: 8.8 mg/dL — ABNORMAL LOW (ref 8.9–10.3)
Chloride: 96 mmol/L — ABNORMAL LOW (ref 98–111)
Creatinine, Ser: 1.93 mg/dL — ABNORMAL HIGH (ref 0.44–1.00)
GFR, Estimated: 28 mL/min — ABNORMAL LOW (ref >60)
Glucose, Bld: 364 mg/dL — ABNORMAL HIGH (ref 70–99)
Potassium: 3.4 mmol/L — ABNORMAL LOW (ref 3.5–5.1)
Sodium: 131 mmol/L — ABNORMAL LOW (ref 135–145)

## 2022-08-22 LAB — MAGNESIUM: Magnesium: 2.6 mg/dL — ABNORMAL HIGH (ref 1.7–2.4)

## 2022-08-22 LAB — GLUCOSE, CAPILLARY
Glucose-Capillary: 344 mg/dL — ABNORMAL HIGH (ref 70–99)
Glucose-Capillary: 285 mg/dL — ABNORMAL HIGH (ref 70–99)

## 2022-08-22 MED ORDER — POTASSIUM CHLORIDE CRYS ER 20 MEQ PO TBCR
40.0000 meq | EXTENDED_RELEASE_TABLET | Freq: Once | ORAL | Status: AC
  Administered 2022-08-22: 40 meq via ORAL
  Filled 2022-08-22: qty 2

## 2022-08-22 MED ORDER — BISACODYL 5 MG PO TBEC
10.0000 mg | DELAYED_RELEASE_TABLET | Freq: Once | ORAL | Status: AC
  Administered 2022-08-22: 10 mg via ORAL
  Filled 2022-08-22: qty 2

## 2022-08-22 MED ORDER — TORSEMIDE 20 MG PO TABS
ORAL_TABLET | ORAL | 0 refills | Status: DC
  Filled 2022-08-22: qty 90, 30d supply, fill #0

## 2022-08-22 MED ORDER — GABAPENTIN 300 MG PO CAPS
300.0000 mg | ORAL_CAPSULE | Freq: Two times a day (BID) | ORAL | 0 refills | Status: DC
  Filled 2022-08-22: qty 60, 30d supply, fill #0

## 2022-08-22 NOTE — Progress Notes (Signed)
Mobility Specialist Progress Note:   08/22/22 1022  Mobility  Activity Ambulated with assistance in hallway  Level of Assistance Contact guard assist, steadying assist  Assistive Device Front wheel walker  Distance Ambulated (ft) 100 ft  Activity Response Tolerated well  $Mobility charge 1 Mobility   Pt in bed willing to participate in mobility. No complaints of pain. Left in chair with call bell in reach and all needs met.  Gareth Eagle Camy Leder Mobility Specialist Please contact via [employer] or  Rehab Office at [phone]

## 2022-08-22 NOTE — Plan of Care (Signed)
  Problem: Education: Goal: Knowledge of General Education information will improve Description: Including pain rating scale, medication(s)/side effects and non-pharmacologic comfort measures Outcome: Progressing   Problem: Health Behavior/Discharge Planning: Goal: Ability to manage health-related needs will improve Outcome: Progressing   Problem: Clinical Measurements: Goal: Ability to maintain clinical measurements within normal limits will improve Outcome: Progressing Goal: Will remain free from infection Outcome: Progressing Goal: Diagnostic test results will improve Outcome: Progressing Goal: Respiratory complications will improve Outcome: Progressing Goal: Cardiovascular complication will be avoided Outcome: Progressing   Problem: Activity: Goal: Risk for activity intolerance will decrease Outcome: Progressing   Problem: Nutrition: Goal: Adequate nutrition will be maintained Outcome: Progressing   Problem: Coping: Goal: Level of anxiety will decrease Outcome: Progressing   Problem: Elimination: Goal: Will not experience complications related to bowel motility Outcome: Progressing Goal: Will not experience complications related to urinary retention Outcome: Progressing   Problem: Pain Managment: Goal: General experience of comfort will improve Outcome: Progressing   Problem: Safety: Goal: Ability to remain free from injury will improve Outcome: Progressing   Problem: Skin Integrity: Goal: Risk for impaired skin integrity will decrease Outcome: Progressing   Problem: Education: Goal: Ability to describe self-care measures that may prevent or decrease complications (Diabetes Survival Skills Education) will improve Outcome: Progressing Goal: Individualized Educational Video(s) Outcome: Progressing   Problem: Coping: Goal: Ability to adjust to condition or change in health will improve Outcome: Progressing   Problem: Fluid Volume: Goal: Ability to  maintain a balanced intake and output will improve Outcome: Progressing   Problem: Health Behavior/Discharge Planning: Goal: Ability to identify and utilize available resources and services will improve Outcome: Progressing Goal: Ability to manage health-related needs will improve Outcome: Progressing   Problem: Metabolic: Goal: Ability to maintain appropriate glucose levels will improve Outcome: Progressing   Problem: Nutritional: Goal: Maintenance of adequate nutrition will improve Outcome: Progressing Goal: Progress toward achieving an optimal weight will improve Outcome: Progressing   Problem: Skin Integrity: Goal: Risk for impaired skin integrity will decrease Outcome: Progressing   Problem: Tissue Perfusion: Goal: Adequacy of tissue perfusion will improve Outcome: Progressing   Problem: Education: Goal: Ability to demonstrate management of disease process will improve Outcome: Progressing Goal: Ability to verbalize understanding of medication therapies will improve Outcome: Progressing Goal: Individualized Educational Video(s) Outcome: Progressing   Problem: Activity: Goal: Capacity to carry out activities will improve Outcome: Progressing   Problem: Cardiac: Goal: Ability to achieve and maintain adequate cardiopulmonary perfusion will improve Outcome: Progressing   Problem: Education: Goal: Ability to demonstrate management of disease process will improve Outcome: Progressing Goal: Ability to verbalize understanding of medication therapies will improve Outcome: Progressing Goal: Individualized Educational Video(s) Outcome: Progressing   Problem: Activity: Goal: Capacity to carry out activities will improve Outcome: Progressing   Problem: Cardiac: Goal: Ability to achieve and maintain adequate cardiopulmonary perfusion will improve Outcome: Progressing

## 2022-08-22 NOTE — Progress Notes (Addendum)
Subjective:  Feels better No chest pain, dyspnea   Current Facility-Administered Medications:    0.9 %  sodium chloride infusion, 250 mL, Intravenous, PRN, Orma Flaming, MD   acetaminophen (TYLENOL) tablet 650 mg, 650 mg, Oral, Q6H PRN, 650 mg at 08/20/22 1923 **OR** acetaminophen (TYLENOL) suppository 650 mg, 650 mg, Rectal, Q6H PRN, Orma Flaming, MD   albuterol (PROVENTIL) (2.5 MG/3ML) 0.083% nebulizer solution 2.5 mg, 2.5 mg, Nebulization, Q4H PRN, Orma Flaming, MD, 2.5 mg at 08/19/22 1343   amLODipine (NORVASC) tablet 10 mg, 10 mg, Oral, Daily, Orma Flaming, MD, 10 mg at 08/22/22 [id number]   artificial tears (LACRILUBE) ophthalmic ointment, , Both Eyes, BID, Orma Flaming, MD, Given at 08/22/22 [phone]   atorvastatin (LIPITOR) tablet 10 mg, 10 mg, Oral, Daily, Orma Flaming, MD, 10 mg at 08/22/22 [id number]   Chlorhexidine Gluconate Cloth 2 % PADS 6 each, 6 each, Topical, Daily, Orma Flaming, MD, 6 each at 08/22/22 0920   dorzolamide (TRUSOPT) 2 % ophthalmic solution 1 drop, 1 drop, Left Eye, BID, Orma Flaming, MD, 1 drop at 08/22/22 [id number]   furosemide (LASIX) injection 40 mg, 40 mg, Intravenous, Q12H, Orma Flaming, MD, 40 mg at 08/22/22 0544   gabapentin (NEURONTIN) capsule 300 mg, 300 mg, Oral, BID, Arrien, Jimmy Picket, MD, 300 mg at 08/22/22 0916   insulin aspart (novoLOG) injection 0-9 Units, 0-9 Units, Subcutaneous, TID WC, Orma Flaming, MD, 7 Units at 08/22/22 0810   insulin glargine-yfgn (SEMGLEE) injection 15 Units, 15 Units, Subcutaneous, Daily, Arrien, Jimmy Picket, MD, 15 Units at 08/22/22 0920   latanoprost (XALATAN) 0.005 % ophthalmic solution 1 drop, 1 drop, Both Eyes, QHS, Orma Flaming, MD, 1 drop at 08/21/22 2115   melatonin tablet 3 mg, 3 mg, Oral, QHS PRN, Howerter, Justin B, DO, 3 mg at 08/21/22 2111   methocarbamol (ROBAXIN) tablet 500-1,000 mg, 500-1,000 mg, Oral, QHS, Orma Flaming, MD, 500 mg at 08/21/22 2110   metoprolol tartrate (LOPRESSOR) tablet 25 mg,  25 mg, Oral, BID, Orma Flaming, MD, 25 mg at 08/22/22 0916   ondansetron (ZOFRAN) tablet 4 mg, 4 mg, Oral, Q6H PRN **OR** ondansetron (ZOFRAN) injection 4 mg, 4 mg, Intravenous, Q6H PRN, Orma Flaming, MD   polyethylene glycol (MIRALAX / GLYCOLAX) packet 17 g, 17 g, Oral, BID, Arrien, Jimmy Picket, MD, 17 g at 08/21/22 1026   potassium chloride SA (KLOR-CON M) CR tablet 40 mEq, 40 mEq, Oral, Once, Arrien, Jimmy Picket, MD   Rivaroxaban Alveda Reasons) tablet 15 mg, 15 mg, Oral, Q supper, Orma Flaming, MD, 15 mg at 08/21/22 1632   sodium chloride flush (NS) 0.9 % injection 3 mL, 3 mL, Intravenous, Q12H, Orma Flaming, MD, 3 mL at 08/22/22 0920   sodium chloride flush (NS) 0.9 % injection 3 mL, 3 mL, Intravenous, PRN, Orma Flaming, MD   timolol (TIMOPTIC) 0.5 % ophthalmic solution 1 drop, 1 drop, Left Eye, BID, Orma Flaming, MD, 1 drop at 08/22/22 [id number]   Objective:  Vital Signs in the last 24 hours: Temp:  [97.6 F (36.4 C)-98.8 F (37.1 C)] 98.4 F (36.9 C) (03/26 0800) Pulse Rate:  [67-87] 67 (03/26 1051) Resp:  [15-22] 15 (03/26 1051) BP: (120-130)/(49-56) 127/56 (03/26 1051) SpO2:  [93 %-97 %] 97 % (03/26 1051) Weight:  [85 kg] 85 kg (03/26 0547)  Intake/Output from previous day: 03/25 0701 - 03/26 0700 In: 120 [P.O.:120] Out: 1625 [Urine:1625]  Physical Exam Vitals and nursing note reviewed.  Constitutional:      General: She is not in acute distress.  Neck:     Vascular: No JVD.  Cardiovascular:     Rate and Rhythm: Normal rate and regular rhythm.     Heart sounds: Murmur heard.     Harsh midsystolic murmur is present with a grade of 3/6 at the upper right sternal border radiating to the neck.     High-pitched blowing holosystolic murmur of grade 3/6 is also present at the apex.  Pulmonary:     Effort: Pulmonary effort is normal.     Breath sounds: Normal breath sounds. No wheezing or rales.  Musculoskeletal:     Right lower leg: No edema.     Left lower leg:  No edema.      Imaging/tests reviewed and independently interpreted: CBC, BMP, BNP   CXR 08/21/2022: Borderline to mild cardiomegaly with vascular congestion and mild interstitial edema. Probable small effusions. Findings do not appear significantly changed      Cardiac Studies:  Telemetry 08/22/2022: No significant arrhtymia  EKG EKG 08/21/2022: Sinus rhythm with 1st degree A-V block Left axis deviation Left ventricular hypertrophy with repolarization abnormality ( R in aVL , Cornell product ) Cannot rule out Septal infarct , age undetermined Abnormal ECG   Echocardiogram 08/21/2022:  1. Left ventricular ejection fraction, by estimation, is 55 to 60%. The  left ventricle has normal function. The left ventricle has no regional  wall motion abnormalities. Left ventricular diastolic parameters are  indeterminate.   2. Right ventricular systolic function is normal. The right ventricular  size is normal.   3. The aortic valve is calcified. Moderate aortic valve stenosis. Aortic  valve area, by VTI measures 1.05 cm. Aortic valve mean gradient measures  18.0 mmHg. Aortic valve Vmax measures 2.71 m/s.   4. The inferior vena cava is normal in size with <50% respiratory  variability, suggesting right atrial pressure of 8 mmHg.   Comparison(s): A prior study was performed on 04/06/2022. AS and MR are no  worse than before.       Assessment & Recommendations:  72 y.o. female with hypertension, diabetes mellitus, HFpEF, paroxysmal Afib/flutter, PSVT, mod AS, mod MR< admitted with acute on chronic HFpEF.   Acute on chronic HFpEF: Now resolved. AS, MR do not appear severe. Can switch to PO diuretics. Recommend torsemide 40 mg in am, 20 mg in pm, Continued to hold losartan and Farxiga for now. Will resume outpatient f/u after further improvement in renal function. Check outpatient labs on 4/1 (ordered for [facility]).   AKI: Improving with diuresis.  Paroxysmal Afib/flutter +  PSVT: In sinus rhythm, CHA2DS2VASc score 4, annual stroke risk 5%. Continue Xarelto 15 mg daily, given GFR <50. Stable normocytic anemia   Hypertension: Controlled   Okay to discharge today. Labs on 4/1 F/u on 4/4.  Discussed interpretation of tests and management recommendations with the primary team   Nigel Mormon, MD Pager: [phone] Office: [phone]

## 2022-08-22 NOTE — Progress Notes (Signed)
Physical Therapy Treatment Patient Details Name: Danielle Beck MRN: [id number] DOB: 04-May-1951 Today's Date: 08/22/2022   History of Present Illness Pt is a 71 y.o. female admitted 3/23 with acute on chronic diastolic heart failure. PMH:  HTN, dyslipidemia, T2DM, CKD, a fib, and heart failure    PT Comments    Pt received in chair, spouse present, pt agreeable to therapy session and with good participation in transfer training and instruction on supine/seated/standing BLE exercises per HEP. Pt needing min guard to minA for transfers with and without UE support and pt/spouse instructed on gait belt use with gait belt obtained for them to take home due to increased fall risk. Pt given handout to reinforce exercises which were reviewed, pt able to demo back with good tolerance. Pt sitting up in chair awaiting completion of discharge by RN at end of session, so she defers gait trial, having recently gotten back to chair from using the bathroom. Pt continues to benefit from PT services to progress toward functional mobility goals.   Recommendations for follow up therapy are one component of a multi-disciplinary discharge planning process, led by the attending physician.  Recommendations may be updated based on patient status, additional functional criteria and insurance authorization.  Follow Up Recommendations       Assistance Recommended at Discharge Intermittent Supervision/Assistance  Patient can return home with the following A little help with walking and/or transfers;A little help with bathing/dressing/bathroom;Assist for transportation;Assistance with cooking/housework   Equipment Recommendations  None recommended by PT    Recommendations for Other Services       Precautions / Restrictions Precautions Precautions: Other (comment) Precaution Comments: watch sats Restrictions Weight Bearing Restrictions: No     Mobility  Bed Mobility Overal bed mobility: Needs Assistance              General bed mobility comments: pt received up in recliner and wanted to stay in chair at end of session    Transfers Overall transfer level: Needs assistance Equipment used: Rolling walker (2 wheels) Transfers: Sit to/from Stand Sit to Stand: Min assist, Min guard           General transfer comment: initially minA from chair>RW, with reciprocal STS pt able to stand from chair pushing on knees x5 reps with min guard; pt/spouse given gait belt to take home    Ambulation/Gait               General Gait Details: pt defers; upcoming DC   Stairs             Wheelchair Mobility    Modified Rankin (Stroke Patients Only)       Balance Overall balance assessment: Mild deficits observed, not formally tested                                          Cognition Arousal/Alertness: Awake/alert Behavior During Therapy: WFL for tasks assessed/performed Overall Cognitive Status: Within Functional Limits for tasks assessed                                          Exercises Other Exercises Other Exercises: STS x 5 reps with hands on lap (no AD) Other Exercises: seated BLE AROM: ankle pumps, LAQ, hip flexion x5-10 reps ea (handout to reinforce) Other  Exercises: Reviewed handout on exercises with CHF including RPE scale, supine/standing exercises, use of gait belt during standing tasks for pt safety, activity pacing; spouse/pt receptive.    General Comments General comments (skin integrity, edema, etc.): no acute s/sx distress, pt on RA      Pertinent Vitals/Pain Pain Assessment Pain Assessment: No/denies pain Pain Intervention(s): Monitored during session     PT Goals (current goals can now be found in the care plan section) Acute Rehab PT Goals Patient Stated Goal: home PT Goal Formulation: With patient/family Time For Goal Achievement: 09/04/22 Progress towards PT goals: Progressing toward goals     Frequency    Min 1X/week      PT Plan Current plan remains appropriate       AM-PAC PT "6 Clicks" Mobility   Outcome Measure  Help needed turning from your back to your side while in a flat bed without using bedrails?: None Help needed moving from lying on your back to sitting on the side of a flat bed without using bedrails?: A Little Help needed moving to and from a bed to a chair (including a wheelchair)?: A Little Help needed standing up from a chair using your arms (e.g., wheelchair or bedside chair)?: A Little Help needed to walk in hospital room?: A Little Help needed climbing 3-5 steps with a railing? : A Lot 6 Click Score: 18    End of Session Equipment Utilized During Treatment: Gait belt Activity Tolerance: Patient tolerated treatment well;Patient limited by fatigue;Other (comment) (preparing for DC) Patient left: in chair;with call bell/phone within reach;with family/visitor present (spouse sitting in room with her) Nurse Communication: Mobility status PT Visit Diagnosis: Muscle weakness (generalized) (M62.81);Difficulty in walking, not elsewhere classified (R26.2)     Time: [id number] PT Time Calculation (min) (ACUTE ONLY): 10 min  Charges:  $Therapeutic Exercise: 8-22 mins                     Danielle Beck P., PTA Acute Rehabilitation Services Secure Chat Preferred 9a-5:30pm Office: [place] 08/22/2022, 3:05 PM

## 2022-08-22 NOTE — TOC Transition Note (Signed)
Transition of Care [facility]) - CM/SW Discharge Note   Patient Details  Name: SOPHIAGRACE QUINLEY MRN: [id number] Date of Birth: 20-Aug-1950  [facility] CM/SW Contact:  Cyndi Bender, RN Phone Number: 08/22/2022, 12:27 PM   Clinical Narrative:     Orders for [place] and DME. Spoke to patient and husband at bedside regarding transition needs. Choice offered and patient defers to [facility] to find highly rated [place] agency.  Tommi Rumps with [facility] accepted referral.  Patient is agreeable to use in house provider, [facility], for DME needs.  Kristin with [facility] notified of walker need.   Address, phone number and PCP confirmed.  Final next level of care: Home w Home Health Services Barriers to Discharge: Barriers Resolved   Patient Goals and CMS Choice CMS Medicare.gov Compare Post Acute Care list provided to:: Patient Choice offered to / list presented to : Patient  Discharge Placement    home                     Discharge Plan and Services Additional resources added to the After Visit Summary for                  DME Arranged: Walker rolling DME Agency: [facility] Date DME Agency Contacted: 08/22/22 Time DME Agency Contacted: 23 Representative spoke with at DME Agency: Erasmo Downer HH Arranged: PT, OT [place] Agency: [place] Date [place]: 08/22/22 Time [place]: 53 Representative spoke with at [place]: [place] ([place]) Interventions SDOH Screenings   Tobacco Use: Low Risk  (08/19/2022)     Readmission Risk Interventions    08/22/2022   12:27 PM 08/22/2022   12:26 PM  Readmission Risk Prevention Plan  Transportation Screening  Complete  PCP or Specialist Appt within 5-7 Days Not Complete Complete  Not Complete comments apt for 08/31/22   Home Care Screening  Complete  Medication Review (RN CM)  Complete

## 2022-08-22 NOTE — Inpatient Diabetes Management (Signed)
Inpatient Diabetes Program Recommendations  AACE/ADA: New Consensus Statement on Inpatient Glycemic Control (2015)  Target Ranges:  Prepandial:   less than 140 mg/dL      Peak postprandial:   less than 180 mg/dL (1-2 hours)      Critically ill patients:  140 - 180 mg/dL   Lab Results  Component Value Date   GLUCAP 344 (H) 08/22/2022    Latest Reference Range & Units 08/21/22 06:01 08/21/22 10:42 08/21/22 13:48 08/21/22 16:17 08/21/22 21:19 08/22/22 07:55  Glucose-Capillary 70 - 99 mg/dL 268 (H) 345 (H) Novolog 5 units 358 (H) Novolog 9 units 217 (H) Novolog 3 units 277 (H) 344 (H) Novolog 7 units  (H): Data is abnormally high  Diabetes history: DM2 Outpatient Diabetes medications: Lantus 30 units qd, Farxiga 10 mg qd, Metformin 1 gm bid Current orders for Inpatient glycemic control:  Semglee 15 units Novolog 0-9 units tid correction  Inpatient Diabetes Program Recommendations:   Please consider: -Increase Semglee to 30 units qd -Add Novolog 4 units tid meal coverage if eats 50% -Add Novolog correction 0-5 units hs  Thank you, Danielle Roys E. Keyia Moretto, RN, MSN, CDE  Diabetes Coordinator Inpatient Glycemic Control Team Team Pager [phone] (8am-5pm) 08/22/2022 9:41 AM

## 2022-08-22 NOTE — Progress Notes (Signed)
Nurse requested Mobility Specialist to perform oxygen saturation test with pt which includes removing pt from oxygen both at rest and while ambulating.  Below are the results from that testing.     Patient Saturations on Room Air at Rest = spO2 96%  Patient Saturations on Room Air while Ambulating = sp02 93% .   Reported results to nurse.

## 2022-08-22 NOTE — Progress Notes (Addendum)
Discharge instructions reviewed with pt and his wife.  Copy of instructions given to pt/husband. Scripts filled by [facility], pt/husband refused medications. Husband states they have more than plenty of the neurontin and torsemide medications at home and will not need script filled.  DME walker to be delivered to the pt's home at pt/husbands request, reached out to [facility] and she has arranged for the walker to be delivered/mailed to the pt's home as pt request. He is aware it may take 1-2 days for delivery.  Pt d/c'd via wheelchair with belongings, with husband.            Escorted by staff.

## 2022-08-22 NOTE — Discharge Summary (Addendum)
Physician Discharge Summary   Patient: Danielle Beck MRN: [id number] DOB: 11/02/1950  Admit date:     08/19/2022  Discharge date: 08/22/22  Discharge Physician: Jimmy Picket Donnica Jarnagin   PCP: Gaynelle Arabian, MD   Recommendations at discharge:    Diuresis with torsemide 40 mg am and 20 mg q pm and increase to 40 mg bid in case of weight gain 2 to 3 lbs in 24 or 5 lbs in 7 days.  Holding losartan and SGLT 2 inh for now, until renal function more stable.  Decreased gabapentin due to low GFR. Follow up with renal function and electrolytes in 7 days.  Follow up with Dr Marisue Humble in 7 to 10 days.  Follow up with Dr Virgina Jock as scheduled.  Follow up with Nephrology as scheduled.   Discharge Diagnoses: Principal Problem:   Acute on chronic diastolic CHF (congestive heart failure) (HCC) Active Problems:   Acute renal failure superimposed on stage 3b chronic kidney disease (HCC)   PAF (paroxysmal atrial fibrillation) (HCC)   Normocytic anemia   Essential hypertension   Type 2 diabetes mellitus with hyperlipidemia (HCC)   Class 1 obesity   Acute on chronic heart failure with preserved ejection fraction ([place])  Resolved Problems:   * No resolved hospital problems. [place] [facility] Course: Mrs. Piechocki was admitted to the hospital with the working diagnosis of heart failure exacerbation.   72 yo female with the past medical history of hypertension, dyslipidemia, T2DM, CKD, atrial fibrillation and heart failure who presented with dyspnea and wheezing. Reported few days of worsening dyspnea and wheezing, along with PND and lower extremity edema. Positive weight gain 13 lbs over last 6 months. On the day of  hospitalization her 02 saturation was 90%, and she was brought to the hospital. On her initial physical examination her blood pressure was 124/70, HR 85, RR 19 and 02 saturation 94% on 4 L/min per [place], lungs with right lower lobes rales with no wheezing, heart with S1 and S2 present and  regular with systolic murmur 3/6, abdomen with no distention and positive lower extremity edema.   Na 121, K 5,1 Cl 90 bicarbonate 18, glucose 108 bun 44 cr 2,15  AST 72 and ALT 43  BNP 663  High sensitive troponin 15 and 11  Wbc 18,7 hgb 9,7 plt 271  Sars covid 19 negative  Viral respiratory panel negative  Urine analysis SG 1,008, protein 100, glucose 50, small leukocytes.   Chest radiograph with cardiomegaly, bilateral hilar vascular congestion, cephalization of the vasculature and bilateral central interstitial infiltrates, small bilateral pleural effusions.   EKG 84 bpm, left axis deviation, interventricular conduction delay, sinus rhythm with no significant ST segment changes, negative T wave lead I and AvL.   03/25 patient placed on furosemide for diuresis, volume status is improving but not back to baseline.  03/26 improved volume status and renal function, plan to continue diuresis at home with oral torsemide, close follow up as outpatient.   Assessment and Plan: * Acute on chronic diastolic CHF (congestive heart failure) (HCC) Echocardiogram from 03/2022 with preserved LV systolic function, moderate aortic stenosis, moderate MR. (Old records personally reviewed).  Limited echocardiogram with LV EF 55 to [id number], RV systolic function preserved, moderate aortic stenosis. Mild to moderate mitral regurgitation.   Patient had furosemide IV for diuresis, negative fluid balance was achieved, -8,669 ml, with significant improvement in her symptoms.   Patient will continue diuresis with torsemide 40 mg in am and 20 mg in pm.  Continue metoprolol and blood pressure control with amlodipine.  Hold on losartan and SGLT 2 inh for now.  Limited pharmacologic therapy due to reduced GFR.  Follow up with cardiology as outpatient.   Acute hypoxemic respiratory failure, due to cardiogenic pulmonary edema. Pneumonia was ruled out. (Reactive leukocytosis improving).   At the time of her discharge  her oxygenation improved, at 97% on room air with no 02 desaturation below 88% on ambulation.   Acute renal failure superimposed on stage 3b chronic kidney disease (HCC) Hyponatremia. Hypokalemia.   Patient responded well to diuresis, at the time of her discharge her renal function had a serum cr at 1,93 with K at 3,4 and serum bicarbonate at 22. Na 131 Mg 2,6   Continue with torsemide for diuresis.  Resume SGLT 2 inh as outpatient when renal function more stable.   PAF (paroxysmal atrial fibrillation) (HCC) Continue rate control with metoprolol. Continue anticoagulation with  rivaroxaban.   Normocytic anemia Follow up hgb is 9.2  Positive iron deficiency anemia with serum iron of 23, TIBC 276, ferritin 44 and transferrin saturation of 8. Will plan for IV iron during this hospitalization after completing cardiac work up.   Essential hypertension Blood pressure control with amlodipine and metoprolol.  Hold on losartan.  Type 2 diabetes mellitus with hyperlipidemia (HCC) Uncontrolled with hyperglycemia.   Patient will resume her insulin regimen at home with basal and short acting insulin.   Continue with statin therapy.   Class 1 obesity Calculated BMI is 34,2         Consultants: cardiology  Procedures performed: none   Disposition: Home Diet recommendation:  Cardiac and Carb modified diet DISCHARGE MEDICATION: Allergies as of 08/22/2022   No Known Allergies      Medication List     STOP taking these medications    dapagliflozin propanediol 10 MG Tabs tablet Commonly known as: FARXIGA   losartan 100 MG tablet Commonly known as: COZAAR       TAKE these medications    amLODipine 10 MG tablet Commonly known as: NORVASC Take 10 mg by mouth daily.   atorvastatin 10 MG tablet Commonly known as: LIPITOR Take 10 mg by mouth daily.   calcium-vitamin D 250-125 MG-UNIT tablet Commonly known as: OSCAL WITH D Take 1 tablet by mouth 2 (two) times daily.    dorzolamide 2 % ophthalmic solution Commonly known as: TRUSOPT Place 1 drop into both eyes 2 (two) times daily.   gabapentin 300 MG capsule Commonly known as: NEURONTIN Take 1 capsule (300 mg total) by mouth 2 (two) times daily. What changed:  medication strength how much to take when to take this   Iron 325 (65 Fe) MG Tabs Take 325 mg by mouth daily.   Lantus SoloStar 100 UNIT/ML Solostar Pen Generic drug: insulin glargine Inject 30 Units into the skin daily.   latanoprost 0.005 % ophthalmic solution Commonly known as: XALATAN Place 1 drop into both eyes at bedtime.   metFORMIN 1000 MG tablet Commonly known as: GLUCOPHAGE Take 1 tablet by mouth in the morning and at bedtime.   methocarbamol 500 MG tablet Commonly known as: ROBAXIN Take 500-1,000 mg by mouth every 6 (six) hours as needed for muscle spasms.   metoprolol tartrate 25 MG tablet Commonly known as: LOPRESSOR TAKE 1 TABLET TWICE DAILY   nitroGLYCERIN 0.4 MG SL tablet Commonly known as: NITROSTAT Place 1 tablet (0.4 mg total) under the tongue every 5 (five) minutes as needed for chest pain.  pyridOXINE 100 MG tablet Commonly known as: VITAMIN B6 Take 100 mg by mouth daily.   Rivaroxaban 15 MG Tabs tablet Commonly known as: XARELTO Take 1 tablet (15 mg total) by mouth daily with supper.   thiamine 100 MG tablet Commonly known as: Vitamin B-1 Take 100 mg by mouth daily.   timolol 0.5 % ophthalmic solution Commonly known as: TIMOPTIC Place 1 drop into both eyes 2 (two) times daily.   torsemide 20 MG tablet Commonly known as: DEMADEX Take 2 tablets in the morning and 1 tablet in the afternoon. What changed: See the new instructions.   traMADol 50 MG tablet Commonly known as: ULTRAM Take 50 mg by mouth every 6 (six) hours as needed for moderate pain.   vitamin B-12 100 MCG tablet Commonly known as: CYANOCOBALAMIN Take 100 mcg by mouth daily.               Durable Medical Equipment   (From admission, onward)           Start     Ordered   08/22/22 1100  For home use only DME Walker rolling  Once       Question Answer Comment  Walker: With 5 Inch Wheels   Patient needs a walker to treat with the following condition Weakness      08/22/22 1100            Follow-up Information     Patwardhan, Reynold Bowen, MD. Call on 08/31/2022.   Specialties: Cardiology, Radiology Why: 11:15 AM Contact information: [place] [address] [phone]                Discharge Exam: Danley Danker Weights   08/20/22 0546 08/21/22 0603 08/22/22 0547  Weight: 87.8 kg 84.9 kg 85 kg   BP (!) 127/56 (BP Location: Right Arm)   Pulse 67   Temp 98.4 F (36.9 C) (Oral)   Resp 15   Ht 5\' 3"  (1.6 m)   Wt 85 kg   SpO2 97%   BMI 33.19 kg/m   Patient is feeling better, no further dyspnea or edema, no pnd, or orthopnea  Neurology awake and alert ENT with mild pallor Cardiovascular with S1 and S2 present and rhythmic, positive systolic murmur at the apex with no gallops.  No JVD No lower extremity edema Respiratory with no rales or wheezing, no rhonchi Abdomen with no distention   Condition at discharge: stable  The results of significant diagnostics from this hospitalization (including imaging, microbiology, ancillary and laboratory) are listed below for reference.   Imaging Studies: DG CHEST PORT 1 VIEW  Result Date: 08/21/2022 CLINICAL DATA:  Dyspnea chest pressure EXAM: PORTABLE CHEST 1 VIEW COMPARISON:  08/20/2022 FINDINGS: Borderline to mild cardiomegaly with vascular congestion and mild interstitial edema. Probable small effusions. Aortic atherosclerosis. No pneumothorax IMPRESSION: Borderline to mild cardiomegaly with vascular congestion and mild interstitial edema. Probable small effusions. Findings do not appear significantly changed Electronically Signed   By: Donavan Foil M.D.   On: 08/21/2022 16:55   ECHOCARDIOGRAM  LIMITED  Result Date: 08/21/2022    ECHOCARDIOGRAM LIMITED REPORT   Patient Name:   MARYAM MONTVILLE Date of Exam: 08/21/2022 Medical Rec #:  [id number]          Height:       63.0 in Accession #:    [id number]         Weight:       187.2 lb Date of Birth:  March 12, 1951          BSA:          1.880 m Patient Age:    71 years           BP:           124/55 mmHg Patient Gender: F                  HR:           79 bpm. Exam Location:  Inpatient Procedure: Limited Echo, Color Doppler and Cardiac Doppler Indications:     R06.9 DOE  History:         Patient has prior history of Echocardiogram examinations, most                  recent 04/10/2022. CHF, Arrythmias:Atrial Fibrillation; Risk                  Factors:Hypertension, Diabetes and Dyslipidemia.  Sonographer:     Raquel Sarna Senior RDCS Referring Phys:  [id number] [place] Diagnosing Phys: Vernell Leep MD  Sonographer Comments: Limited to evaluate AS, MR, and EF IMPRESSIONS  1. Left ventricular ejection fraction, by estimation, is 55 to 60%. The left ventricle has normal function. The left ventricle has no regional wall motion abnormalities. Left ventricular diastolic parameters are indeterminate.  2. Right ventricular systolic function is normal. The right ventricular size is normal.  3. The aortic valve is calcified. Moderate aortic valve stenosis. Aortic valve area, by VTI measures 1.05 cm. Aortic valve mean gradient measures 18.0 mmHg. Aortic valve Vmax measures 2.71 m/s.  4. The inferior vena cava is normal in size with <50% respiratory variability, suggesting right atrial pressure of 8 mmHg. Comparison(s): A prior study was performed on 04/06/2022. AS and MR are no worse than before. FINDINGS  Left Ventricle: Left ventricular ejection fraction, by estimation, is 55 to 60%. The left ventricle has normal function. The left ventricle has no regional wall motion abnormalities. The left ventricular internal cavity size was normal in size. Left  ventricular diastolic parameters are indeterminate. Normal left ventricular filling pressure. Right Ventricle: The right ventricular size is normal. No increase in right ventricular wall thickness. Right ventricular systolic function is normal. Mitral Valve: Mild to moderate mitral annular calcification. Aortic Valve: The aortic valve is calcified. Moderate aortic stenosis is present. Aortic valve mean gradient measures 18.0 mmHg. Aortic valve peak gradient measures 29.4 mmHg. Aortic valve area, by VTI measures 1.05 cm. Venous: The inferior vena cava is normal in size with less than 50% respiratory variability, suggesting right atrial pressure of 8 mmHg. Additional Comments: Spectral Doppler performed. Color Doppler performed.  LEFT VENTRICLE PLAX 2D LVOT diam:     1.90 cm LV SV:         67 LV SV Index:   36 LVOT Area:     2.84 cm  RIGHT VENTRICLE RV S prime:     10.60 cm/s TAPSE (M-mode): 2.0 cm AORTIC VALVE AV Area (Vmax):    1.11 cm AV Area (Vmean):   1.08 cm AV Area (VTI):     1.05 cm AV Vmax:           271.00 cm/s AV Vmean:          203.000 cm/s AV VTI:            0.644 m AV Peak Grad:      29.4 mmHg AV Mean Grad:      18.0 mmHg  LVOT Vmax:         106.00 cm/s LVOT Vmean:        77.000 cm/s LVOT VTI:          0.238 m LVOT/AV VTI ratio: 0.37 MITRAL VALVE MV Area (PHT): 2.67 cm     SHUNTS MV Decel Time: 284 msec     Systemic VTI:  0.24 m MV E velocity: 106.00 cm/s  Systemic Diam: 1.90 cm MV A velocity: 113.00 cm/s MV E/A ratio:  0.94 Manish Patwardhan MD Electronically signed by Vernell Leep MD Signature Date/Time: 08/21/2022/4:13:56 PM    Final    DG Chest 2 View  Result Date: 08/20/2022 CLINICAL DATA:  Acute exacerbation of CHF EXAM: CHEST - 2 VIEW COMPARISON:  08/19/2022 FINDINGS: Borderline enlargement of cardiac silhouette with slight vascular congestion. Mediastinal contours normal. Atherosclerotic calcification aorta. Improved perihilar infiltrates though scattered RIGHT lung infiltrates  persist. No pleural effusion or pneumothorax. Bones demineralized. IMPRESSION: Improved pulmonary infiltrates. Aortic Atherosclerosis (ICD10-I70.0). Electronically Signed   By: Lavonia Dana M.D.   On: 08/20/2022 13:35   DG Chest Port 1 View  Result Date: 08/19/2022 CLINICAL DATA:  [id number] with acute respiratory distress and hypoxia. Recheck edema. EXAM: PORTABLE CHEST 1 VIEW COMPARISON:  PA lateral earlier today at 4:58 a.m. FINDINGS: AP chest portable at 10:10 p.m.: The heart is upper-normal to slightly prominent for size with continued mild central vascular prominence. There is mild interstitial edema with a basal gradient and interval improvement. Small pleural effusions are again noted and similar. There are increased perihilar opacities on the right-greater-than-left, which could indicate evidence of pneumonia or airspace edema. The remaining lungs are clear. There is aortic atherosclerosis, stable mediastinum. Osteopenia. IMPRESSION: 1. Increased perihilar opacities on the right-greater-than-left, which could indicate airspace edema or pneumonia. 2. Continued mild interstitial edema with a basal gradient and small pleural effusions, with improved interstitial edema component. 3. Aortic atherosclerosis. 4. No other acute chest findings or changes. 5. Osteopenia. Electronically Signed   By: Telford Nab M.D.   On: 08/19/2022 22:23   DG Chest 2 View  Result Date: 08/19/2022 CLINICAL DATA:  Shortness of breath. EXAM: CHEST - 2 VIEW COMPARISON:  03/05/2019 FINDINGS: The lungs are clear without focal pneumonia, edema, pneumothorax or pleural effusion. Diffuse interstitial opacity is progressive in the interval suggesting pulmonary edema. No focal consolidation or substantial pleural effusion. The cardiopericardial silhouette is within normal limits for size. The visualized bony structures of the thorax are unremarkable. Telemetry leads overlie the chest. IMPRESSION: Interval progression of diffuse  interstitial opacity suggesting pulmonary edema. Electronically Signed   By: Misty Stanley M.D.   On: 08/19/2022 05:28    Microbiology: Results for orders placed or performed during the hospital encounter of 08/19/22  Resp panel by RT-PCR (RSV, Flu A&B, Covid) Anterior Nasal Swab     Status: None   Collection Time: 08/19/22  4:16 AM   Specimen: Anterior Nasal Swab  Result Value Ref Range Status   SARS Coronavirus 2 by RT PCR NEGATIVE NEGATIVE Final   Influenza A by PCR NEGATIVE NEGATIVE Final   Influenza B by PCR NEGATIVE NEGATIVE Final    Comment: (NOTE) The Xpert Xpress SARS-CoV-2/FLU/RSV plus assay is intended as an aid in the diagnosis of influenza from Nasopharyngeal swab specimens and should not be used as a sole basis for treatment. Nasal washings and aspirates are unacceptable for Xpert Xpress SARS-CoV-2/FLU/RSV testing.  Fact Sheet for Patients: EntrepreneurPulse.com.au  Fact Sheet for Healthcare Providers: IncredibleEmployment.be  This test  is not yet approved or cleared by the [place] and has been authorized for detection and/or diagnosis of SARS-CoV-2 by FDA under an Emergency Use Authorization (EUA). This EUA will remain in effect (meaning this test can be used) for the duration of the COVID-19 declaration under Section 564(b)(1) of the Act, 21 U.S.C. section 360bbb-3(b)(1), unless the authorization is terminated or revoked.     Resp Syncytial Virus by PCR NEGATIVE NEGATIVE Final    Comment: (NOTE) Fact Sheet for Patients: EntrepreneurPulse.com.au  Fact Sheet for Healthcare Providers: IncredibleEmployment.be  This test is not yet approved or cleared by the [place] FDA and has been authorized for detection and/or diagnosis of SARS-CoV-2 by FDA under an Emergency Use Authorization (EUA). This EUA will remain in effect (meaning this test can be used) for the duration of  the COVID-19 declaration under Section 564(b)(1) of the Act, 21 U.S.C. section 360bbb-3(b)(1), unless the authorization is terminated or revoked.  Performed at [facility], [place] [address] [place], [place] [address]   Respiratory (~20 pathogens) panel by PCR     Status: None   Collection Time: 08/19/22  4:16 AM   Specimen: Nasopharyngeal Swab; Respiratory  Result Value Ref Range Status   Adenovirus NOT DETECTED NOT DETECTED Final   Coronavirus 229E NOT DETECTED NOT DETECTED Final    Comment: (NOTE) The Coronavirus on the Respiratory Panel, DOES NOT test for the novel  Coronavirus (2019 nCoV)    Coronavirus HKU1 NOT DETECTED NOT DETECTED Final   Coronavirus NL63 NOT DETECTED NOT DETECTED Final   Coronavirus OC43 NOT DETECTED NOT DETECTED Final   Metapneumovirus NOT DETECTED NOT DETECTED Final   Rhinovirus / Enterovirus NOT DETECTED NOT DETECTED Final   Influenza A NOT DETECTED NOT DETECTED Final   Influenza B NOT DETECTED NOT DETECTED Final   Parainfluenza Virus 1 NOT DETECTED NOT DETECTED Final   Parainfluenza Virus 2 NOT DETECTED NOT DETECTED Final   Parainfluenza Virus 3 NOT DETECTED NOT DETECTED Final   Parainfluenza Virus 4 NOT DETECTED NOT DETECTED Final   Respiratory Syncytial Virus NOT DETECTED NOT DETECTED Final   Bordetella pertussis NOT DETECTED NOT DETECTED Final   Bordetella Parapertussis NOT DETECTED NOT DETECTED Final   Chlamydophila pneumoniae NOT DETECTED NOT DETECTED Final   Mycoplasma pneumoniae NOT DETECTED NOT DETECTED Final    Comment: Performed at [facility], [place]. [address] [place], [place] [address]  Blood culture (routine x 2)     Status: None (Preliminary result)   Collection Time: 08/19/22  4:48 AM   Specimen: BLOOD  Result Value Ref Range Status   Specimen Description BLOOD RIGHT ANTECUBITAL  Final   Special Requests   Final    BOTTLES DRAWN AEROBIC AND ANAEROBIC Blood Culture adequate volume   Culture   Final    NO GROWTH 3  DAYS Performed at [facility], [place] [address] [place], [place] [address]    Report Status PENDING  Incomplete  Blood culture (routine x 2)     Status: None (Preliminary result)   Collection Time: 08/19/22  4:53 AM   Specimen: BLOOD  Result Value Ref Range Status   Specimen Description BLOOD BLOOD RIGHT FOREARM  Final   Special Requests   Final    BOTTLES DRAWN AEROBIC AND ANAEROBIC Blood Culture results may not be optimal due to an inadequate volume of blood received in culture bottles   Culture   Final    NO GROWTH 3 DAYS Performed  at [facility], [place] [address] [place], [place] [address]    Report Status PENDING  Incomplete  MRSA Next Gen by PCR, Nasal     Status: None   Collection Time: 08/19/22  3:36 PM   Specimen: Nasal Mucosa; Nasal Swab  Result Value Ref Range Status   MRSA by PCR Next Gen NOT DETECTED NOT DETECTED Final    Comment: (NOTE) The GeneXpert MRSA Assay (FDA approved for NASAL specimens only), is one component of a comprehensive MRSA colonization surveillance program. It is not intended to diagnose MRSA infection nor to guide or monitor treatment for MRSA infections. Test performance is not FDA approved in patients less than 83 years old. Performed at [facility], [place] [address], [place],  [address]     Labs: CBC: Recent Labs  Lab 08/19/22 0415 08/20/22 0713  WBC 18.7* 12.8*  NEUTROABS 16.1*  --   HGB 9.7* 9.2*  HCT 27.5* 25.7*  MCV 89.9 89.5  PLT 271 0000000   Basic Metabolic Panel: Recent Labs  Lab 08/19/22 0415 08/19/22 0923 08/20/22 0713 08/21/22 0025 08/22/22 0851  NA 121*  --  126* 125* 131*  K 5.1  --  3.5 3.8 3.4*  CL 90*  --  95* 98 96*  CO2 18*  --  21* 18* 22  GLUCOSE 108*  --  235* 280* 364*  BUN 44*  --  42* 45* 51*  CREATININE 2.15*  --  2.06* 2.10* 1.93*  CALCIUM 9.1  --  8.9 8.2* 8.8*  MG  --  2.1  --  2.4 2.6*   Liver Function Tests: Recent Labs  Lab 08/19/22 0415  AST 72*  ALT 43   ALKPHOS 55  BILITOT 0.8  PROT 6.8  ALBUMIN 3.7   CBG: Recent Labs  Lab 08/21/22 1348 08/21/22 1617 08/21/22 2119 08/22/22 0755 08/22/22 1050  GLUCAP 358* 217* 277* 344* 285*    Discharge time spent: greater than 30 minutes.  Signed: Tawni Millers, MD [facility] 08/22/2022

## 2022-08-22 NOTE — Care Management Important Message (Signed)
Important Message  Patient Details  Name: DEANIA ROCKWOOD MRN: [id number] Date of Birth: 12-10-1950   Medicare Important Message Given:  Yes     Navia Lindahl 08/22/2022, 1:52 PM

## 2022-08-23 ENCOUNTER — Ambulatory Visit: Payer: Medicare HMO | Admitting: Cardiology

## 2022-08-23 ENCOUNTER — Other Ambulatory Visit: Payer: Self-pay | Admitting: Cardiology

## 2022-08-23 DIAGNOSIS — I5032 Chronic diastolic (congestive) heart failure: Secondary | ICD-10-CM

## 2022-08-23 NOTE — Telephone Encounter (Signed)
Location of hospitalization: [facility] Reason for hospitalization: SOB Date of discharge: 08/22/2022 Date of first communication with patient: today Person contacting patient: Me Current symptoms: None Do you understand why you were in the Hospital: Yes Questions regarding discharge instructions: None Where were you discharged to: Home Medications reviewed: Yes Allergies reviewed: Yes Dietary changes reviewed: Yes. Discussed low fat and low salt diet.  Referals reviewed: NA Activities of Daily Living: Able to with mild limitations Any transportation issues/concerns: None Any patient concerns: None Confirmed importance & date/time of Follow up appt: Yes Confirmed with patient if condition begins to worsen call. Pt was given the office number and encouraged to call back with questions or concerns: Yes

## 2022-08-24 LAB — CULTURE, BLOOD (ROUTINE X 2)
Culture: NO GROWTH
Culture: NO GROWTH
Special Requests: ADEQUATE

## 2022-08-25 DIAGNOSIS — I080 Rheumatic disorders of both mitral and aortic valves: Secondary | ICD-10-CM | POA: Diagnosis not present

## 2022-08-25 DIAGNOSIS — D631 Anemia in chronic kidney disease: Secondary | ICD-10-CM | POA: Diagnosis not present

## 2022-08-25 DIAGNOSIS — I480 Paroxysmal atrial fibrillation: Secondary | ICD-10-CM | POA: Diagnosis not present

## 2022-08-25 DIAGNOSIS — I459 Conduction disorder, unspecified: Secondary | ICD-10-CM | POA: Diagnosis not present

## 2022-08-25 DIAGNOSIS — J9601 Acute respiratory failure with hypoxia: Secondary | ICD-10-CM | POA: Diagnosis not present

## 2022-08-25 DIAGNOSIS — E1122 Type 2 diabetes mellitus with diabetic chronic kidney disease: Secondary | ICD-10-CM | POA: Diagnosis not present

## 2022-08-25 DIAGNOSIS — I130 Hypertensive heart and chronic kidney disease with heart failure and stage 1 through stage 4 chronic kidney disease, or unspecified chronic kidney disease: Secondary | ICD-10-CM | POA: Diagnosis not present

## 2022-08-25 DIAGNOSIS — N1832 Chronic kidney disease, stage 3b: Secondary | ICD-10-CM | POA: Diagnosis not present

## 2022-08-25 DIAGNOSIS — I5033 Acute on chronic diastolic (congestive) heart failure: Secondary | ICD-10-CM | POA: Diagnosis not present

## 2022-08-28 DIAGNOSIS — E1122 Type 2 diabetes mellitus with diabetic chronic kidney disease: Secondary | ICD-10-CM | POA: Diagnosis not present

## 2022-08-28 DIAGNOSIS — I130 Hypertensive heart and chronic kidney disease with heart failure and stage 1 through stage 4 chronic kidney disease, or unspecified chronic kidney disease: Secondary | ICD-10-CM | POA: Diagnosis not present

## 2022-08-28 DIAGNOSIS — I5032 Chronic diastolic (congestive) heart failure: Secondary | ICD-10-CM | POA: Diagnosis not present

## 2022-08-28 DIAGNOSIS — J9601 Acute respiratory failure with hypoxia: Secondary | ICD-10-CM | POA: Diagnosis not present

## 2022-08-28 DIAGNOSIS — I459 Conduction disorder, unspecified: Secondary | ICD-10-CM | POA: Diagnosis not present

## 2022-08-28 DIAGNOSIS — I480 Paroxysmal atrial fibrillation: Secondary | ICD-10-CM | POA: Diagnosis not present

## 2022-08-28 DIAGNOSIS — I5033 Acute on chronic diastolic (congestive) heart failure: Secondary | ICD-10-CM | POA: Diagnosis not present

## 2022-08-28 DIAGNOSIS — D631 Anemia in chronic kidney disease: Secondary | ICD-10-CM | POA: Diagnosis not present

## 2022-08-28 DIAGNOSIS — I080 Rheumatic disorders of both mitral and aortic valves: Secondary | ICD-10-CM | POA: Diagnosis not present

## 2022-08-28 DIAGNOSIS — N1832 Chronic kidney disease, stage 3b: Secondary | ICD-10-CM | POA: Diagnosis not present

## 2022-08-29 LAB — BASIC METABOLIC PANEL
BUN/Creatinine Ratio: 33 — ABNORMAL HIGH (ref 12–28)
BUN: 48 mg/dL — ABNORMAL HIGH (ref 8–27)
CO2: 21 mmol/L (ref 20–29)
Calcium: 10 mg/dL (ref 8.7–10.3)
Chloride: 99 mmol/L (ref 96–106)
Creatinine, Ser: 1.47 mg/dL — ABNORMAL HIGH (ref 0.57–1.00)
Glucose: 147 mg/dL — ABNORMAL HIGH (ref 70–99)
Potassium: 4.4 mmol/L (ref 3.5–5.2)
Sodium: 135 mmol/L (ref 134–144)
eGFR: 38 mL/min/{1.73_m2} — ABNORMAL LOW (ref >59)

## 2022-08-29 LAB — PRO B NATRIURETIC PEPTIDE: NT-Pro BNP: 620 pg/mL — ABNORMAL HIGH (ref 0–301)

## 2022-08-31 ENCOUNTER — Ambulatory Visit: Payer: Medicare HMO | Admitting: Cardiology

## 2022-08-31 ENCOUNTER — Encounter: Payer: Self-pay | Admitting: Cardiology

## 2022-08-31 VITALS — BP 130/57 | HR 80 | Resp 16 | Ht 63.0 in | Wt 185.0 lb

## 2022-08-31 DIAGNOSIS — R600 Localized edema: Secondary | ICD-10-CM | POA: Diagnosis not present

## 2022-08-31 DIAGNOSIS — I350 Nonrheumatic aortic (valve) stenosis: Secondary | ICD-10-CM

## 2022-08-31 DIAGNOSIS — E1122 Type 2 diabetes mellitus with diabetic chronic kidney disease: Secondary | ICD-10-CM | POA: Diagnosis not present

## 2022-08-31 DIAGNOSIS — I480 Paroxysmal atrial fibrillation: Secondary | ICD-10-CM | POA: Diagnosis not present

## 2022-08-31 DIAGNOSIS — I080 Rheumatic disorders of both mitral and aortic valves: Secondary | ICD-10-CM | POA: Diagnosis not present

## 2022-08-31 DIAGNOSIS — I130 Hypertensive heart and chronic kidney disease with heart failure and stage 1 through stage 4 chronic kidney disease, or unspecified chronic kidney disease: Secondary | ICD-10-CM | POA: Diagnosis not present

## 2022-08-31 DIAGNOSIS — I340 Nonrheumatic mitral (valve) insufficiency: Secondary | ICD-10-CM

## 2022-08-31 DIAGNOSIS — I5033 Acute on chronic diastolic (congestive) heart failure: Secondary | ICD-10-CM | POA: Diagnosis not present

## 2022-08-31 DIAGNOSIS — D631 Anemia in chronic kidney disease: Secondary | ICD-10-CM | POA: Diagnosis not present

## 2022-08-31 DIAGNOSIS — I5032 Chronic diastolic (congestive) heart failure: Secondary | ICD-10-CM

## 2022-08-31 DIAGNOSIS — N1832 Chronic kidney disease, stage 3b: Secondary | ICD-10-CM | POA: Diagnosis not present

## 2022-08-31 DIAGNOSIS — I459 Conduction disorder, unspecified: Secondary | ICD-10-CM | POA: Diagnosis not present

## 2022-08-31 DIAGNOSIS — J9601 Acute respiratory failure with hypoxia: Secondary | ICD-10-CM | POA: Diagnosis not present

## 2022-08-31 MED ORDER — LOSARTAN POTASSIUM 25 MG PO TABS: 25.0000 mg | ORAL_TABLET | Freq: Every day | ORAL | 3 refills | Status: DC

## 2022-08-31 MED ORDER — TORSEMIDE 20 MG PO TABS: 20.0000 mg | ORAL_TABLET | Freq: Every day | ORAL | 1 refills | Status: DC

## 2022-08-31 MED ORDER — DAPAGLIFLOZIN PROPANEDIOL 10 MG PO TABS: 10.0000 mg | ORAL_TABLET | Freq: Every day | ORAL | 0 refills | Status: DC

## 2022-08-31 NOTE — Progress Notes (Addendum)
TOC visit  Patient referred by Gaynelle Arabian, MD for shortness of breath  Subjective:   Danielle Beck, female    DOB: 04-Feb-1951, 72 y.o.   MRN: [id number]  Chief Complaint  Patient presents with   Atrial Fibrillation   Congestive Heart Failure   Hospitalization Follow-up   HPI  71 y.o. female with hypertension, diabetes mellitus, HFpEF, paroxysmal Afib/flutter, PSVT, mod AS, mod MR, HFPF.  Patient was hospitalized at [facility] in 07/2022 with acute on chronic HFpEF.  She diuresed well with significant improvement in symptoms.  Echocardiogram showed moderate AAS, moderate MR, without any significant progression compared to previous echocardiogram 03/2022.  Subsequent labs reviewed with the patient, details below.  Patient has been doing very well since hospital discharge. Breathing and leg edema significantly improved. Home weight today is 182 lb.     Current Outpatient Medications:    amLODipine (NORVASC) 10 MG tablet, Take 10 mg by mouth daily. , Disp: , Rfl:    atorvastatin (LIPITOR) 10 MG tablet, Take 10 mg by mouth daily., Disp: , Rfl:    calcium-vitamin D (OSCAL WITH D) 250-125 MG-UNIT tablet, Take 1 tablet by mouth 2 (two) times daily. , Disp: , Rfl:    dorzolamide (TRUSOPT) 2 % ophthalmic solution, Place 1 drop into both eyes 2 (two) times daily., Disp: , Rfl:    Ferrous Sulfate (IRON) 325 (65 Fe) MG TABS, Take 325 mg by mouth daily., Disp: , Rfl:    gabapentin (NEURONTIN) 300 MG capsule, Take 1 capsule (300 mg total) by mouth 2 (two) times daily., Disp: 60 capsule, Rfl: 0   LANTUS SOLOSTAR 100 UNIT/ML Solostar Pen, Inject 30 Units into the skin daily., Disp: , Rfl:    latanoprost (XALATAN) 0.005 % ophthalmic solution, Place 1 drop into both eyes at bedtime. , Disp: , Rfl:    metFORMIN (GLUCOPHAGE) 1000 MG tablet, Take 1 tablet by mouth in the morning and at bedtime., Disp: , Rfl:    methocarbamol (ROBAXIN) 500 MG tablet, Take 500-1,000 mg by mouth every  6 (six) hours as needed for muscle spasms., Disp: , Rfl:    metoprolol tartrate (LOPRESSOR) 25 MG tablet, TAKE 1 TABLET TWICE DAILY (Patient taking differently: Take 25 mg by mouth 2 (two) times daily.), Disp: 180 tablet, Rfl: 3   nitroGLYCERIN (NITROSTAT) 0.4 MG SL tablet, Place 1 tablet (0.4 mg total) under the tongue every 5 (five) minutes as needed for chest pain., Disp: 30 tablet, Rfl: 3   pyridOXINE (VITAMIN B-6) 100 MG tablet, Take 100 mg by mouth daily., Disp: , Rfl:    Rivaroxaban (XARELTO) 15 MG TABS tablet, Take 1 tablet (15 mg total) by mouth daily with supper., Disp: 90 tablet, Rfl: 3   thiamine (VITAMIN B-1) 100 MG tablet, Take 100 mg by mouth daily., Disp: , Rfl:    timolol (TIMOPTIC) 0.5 % ophthalmic solution, Place 1 drop into both eyes 2 (two) times daily., Disp: , Rfl:    torsemide (DEMADEX) 20 MG tablet, Take 2 tablets in the morning and 1 tablet in the afternoon., Disp: 120 tablet, Rfl: 0   traMADol (ULTRAM) 50 MG tablet, Take 50 mg by mouth every 6 (six) hours as needed for moderate pain., Disp: , Rfl:    vitamin B-12 (CYANOCOBALAMIN) 100 MCG tablet, Take 100 mcg by mouth daily., Disp: , Rfl:   Cardiovascular studies:  EKG 08/31/2022: Sinus rhythm 79 bpm LAFB LVH Possible old anteroseptal infarct  Echocardiogram 08/21/2022: 1. Left ventricular ejection fraction,  by estimation, is 55 to 60%. The  left ventricle has normal function. The left ventricle has no regional  wall motion abnormalities. Left ventricular diastolic parameters are  indeterminate.   2. Right ventricular systolic function is normal. The right ventricular  size is normal.   3. The aortic valve is calcified. Moderate aortic valve stenosis. Aortic  valve area, by VTI measures 1.05 cm. Aortic valve mean gradient measures  18.0 mmHg. Aortic valve Vmax measures 2.71 m/s.   4. The inferior vena cava is normal in size with <50% respiratory  variability, suggesting right atrial pressure of 8 mmHg.    Comparison(s): A prior study was performed on 04/06/2022. AS and MR are no  worse than before.   Lexiscan  Nuclear stress test 05/04/2022: Myocardial perfusion is abnormal. Breast tissue attenuation noted in inferior wall. Cannot exclude mild degree medium extent perfusion defect consistent with ischemia located in the apical inferior wall and mid inferior wall. The LV appears slightly dilated in the stress images, although TID index is insignificant. Overall LV systolic function is normal without regional wall motion abnormalities.  Stress LV EF: 54%.  Non-diagnostic ECG stress. The heart rate response was consistent with Regadenoson.  Compared to previous study 03/24/2019, soft tissue attenuation was previously noted. Possible inferior ischemia is new.  Intermediate risk.    Real time outpatient cardiac telemetry 09/23/2019: Monitoring period 360 hr 41 min HR 56-170 bpm. Avg HR 83 bpm Rare ventricular ectopy (PVC/pair) Frequent PAC's and occasional atrial bigeminy in sinus rhythm.  Multiple runs of SVT at 150 bpm with associated T wave inversions, followed by coarse Afib/atrial flutter episodes SVT burden <1%, Afib burden <1% No symptoms associated with above arrhythmia. Patient activated events correlate with sinus rhythm. No VT/high grade AV block, sinus pause >3sec noted.  ABI 09/12/2019:  This exam reveals normal perfusion of the lower extremity (ABI 1.07 bilateral).   Triphasic (normal) waveform noted at the level of the ankle.   Recent labs:  Latest Reference Range & Units 08/22/22 08:51 08/28/22 08:47  Sodium 134 - 144 mmol/L 131 (L) 135  Potassium 3.5 - 5.2 mmol/L 3.4 (L) 4.4  Chloride 96 - 106 mmol/L 96 (L) 99  CO2 20 - 29 mmol/L 22 21  Glucose 70 - 99 mg/dL 364 (H) 147 (H)  BUN 8 - 27 mg/dL 51 (H) 48 (H)  Creatinine 0.57 - 1.00 mg/dL 1.93 (H) 1.47 (H)  Calcium 8.7 - 10.3 mg/dL 8.8 (L) 10.0  Anion gap 5 - 15  13   BUN/Creatinine Ratio 12 - 28   33 (H)  eGFR >59  mL/min/1.73  38 (L)  (L): Data is abnormally low (H): Data is abnormally high  Latest Reference Range & Units 08/19/22 04:02 08/19/22 04:15 08/28/22 08:47  B Natriuretic Peptide 0.0 - 100.0 pg/mL  663.9 (H)   NT-Pro BNP 0 - 301 pg/mL   620 (H)  Troponin I (High Sensitivity) <18 ng/L 15 11   (H): Data is abnormally high   Review of Systems  Cardiovascular:  Positive for chest pain, dyspnea on exertion (Improved) and leg swelling. Negative for palpitations and syncope.  All other systems reviewed and are negative.      Vitals:   08/31/22 1106  BP: (!) 130/57  Pulse: 80  Resp: 16  SpO2: 97%   Filed Weights   08/31/22 1106  Weight: 185 lb (83.9 kg)       Objective:    Physical exam: Physical Exam Vitals and nursing note  reviewed.  Constitutional:      General: She is not in acute distress. Neck:     Vascular: No JVD.  Cardiovascular:     Rate and Rhythm: Normal rate and regular rhythm.     Heart sounds: Murmur heard.     Harsh midsystolic murmur is present with a grade of 2/6 at the upper right sternal border radiating to the neck.     High-pitched blowing holosystolic murmur of grade 2/6 is also present at the apex.  Pulmonary:     Effort: Pulmonary effort is normal.     Breath sounds: Normal breath sounds. No wheezing or rales.  Musculoskeletal:     Right lower leg: Edema (Trace) present.     Left lower leg: No edema.        ICD-10-CM   1. Chronic heart failure with preserved ejection fraction  I50.32 EKG 12-Lead    torsemide (DEMADEX) 20 MG tablet    Basic metabolic panel    Pro b natriuretic peptide (BNP)[id number])    2. Leg edema  R60.0 torsemide (DEMADEX) 20 MG tablet        Assessment & Recommendations:   72 y.o. female with hypertension, diabetes mellitus, HFpEF, mod AS, mod MR,  paroxysmal Afib/flutter, PSVT  HFpEF: Acute on chronic HFpEF resolved. AS, MR do not appear severe. Recommend the following change today now  that renal function has improved.  Add losartan 25 mg daily, Farxiga 10 mg daily.  Reduce torsemide from 40 mg in am and 20 mg in pm, to 20 mg bid. Recommend 2-3 L water intake. Check BMP, proBNP in 1 week  AS, MR: Mod MS, mod MR. Repeat echocardiogram in Oct-Nov 2024.  Paroxysmal Afib/flutter + PSVT: Improved. CHA2DS2VASc score 4, annual stroke risk 5%. Continue Xarelto 15 mg daily, given GFR <50. Stable normocytic anemia. Continue Xarelto 15 mg daily.  Hypertension: Controlled  F/u in  weeks    Nigel Mormon, MD Pager: [phone] Office: [phone]

## 2022-09-01 DIAGNOSIS — E113512 Type 2 diabetes mellitus with proliferative diabetic retinopathy with macular edema, left eye: Secondary | ICD-10-CM | POA: Diagnosis not present

## 2022-09-01 MED ORDER — TORSEMIDE 20 MG PO TABS
20.0000 mg | ORAL_TABLET | Freq: Two times a day (BID) | ORAL | 3 refills | Status: DC
Start: 2022-09-01 — End: 2023-02-19

## 2022-09-01 NOTE — Addendum Note (Signed)
Addended by: Elder Negus on: 09/01/2022 01:07 PM   Modules accepted: Orders

## 2022-09-04 DIAGNOSIS — I5032 Chronic diastolic (congestive) heart failure: Secondary | ICD-10-CM | POA: Diagnosis not present

## 2022-09-05 ENCOUNTER — Encounter: Payer: Self-pay | Admitting: Cardiology

## 2022-09-05 LAB — BASIC METABOLIC PANEL
BUN/Creatinine Ratio: 28 (ref 12–28)
BUN: 42 mg/dL — ABNORMAL HIGH (ref 8–27)
CO2: 21 mmol/L (ref 20–29)
Calcium: 10.1 mg/dL (ref 8.7–10.3)
Chloride: 100 mmol/L (ref 96–106)
Creatinine, Ser: 1.51 mg/dL — ABNORMAL HIGH (ref 0.57–1.00)
Glucose: 114 mg/dL — ABNORMAL HIGH (ref 70–99)
Potassium: 4.6 mmol/L (ref 3.5–5.2)
Sodium: 140 mmol/L (ref 134–144)
eGFR: 37 mL/min/{1.73_m2} — ABNORMAL LOW (ref >59)

## 2022-09-05 LAB — PRO B NATRIURETIC PEPTIDE: NT-Pro BNP: 577 pg/mL — ABNORMAL HIGH (ref 0–301)

## 2022-09-05 NOTE — Telephone Encounter (Signed)
From patients husband

## 2022-09-06 DIAGNOSIS — I480 Paroxysmal atrial fibrillation: Secondary | ICD-10-CM | POA: Diagnosis not present

## 2022-09-06 DIAGNOSIS — I5032 Chronic diastolic (congestive) heart failure: Secondary | ICD-10-CM | POA: Diagnosis not present

## 2022-09-06 DIAGNOSIS — I350 Nonrheumatic aortic (valve) stenosis: Secondary | ICD-10-CM | POA: Diagnosis not present

## 2022-09-06 DIAGNOSIS — E1122 Type 2 diabetes mellitus with diabetic chronic kidney disease: Secondary | ICD-10-CM | POA: Diagnosis not present

## 2022-09-06 DIAGNOSIS — E7800 Pure hypercholesterolemia, unspecified: Secondary | ICD-10-CM | POA: Diagnosis not present

## 2022-09-06 DIAGNOSIS — I10 Essential (primary) hypertension: Secondary | ICD-10-CM | POA: Diagnosis not present

## 2022-09-06 DIAGNOSIS — I340 Nonrheumatic mitral (valve) insufficiency: Secondary | ICD-10-CM | POA: Diagnosis not present

## 2022-09-06 DIAGNOSIS — M48061 Spinal stenosis, lumbar region without neurogenic claudication: Secondary | ICD-10-CM | POA: Diagnosis not present

## 2022-09-06 DIAGNOSIS — N1832 Chronic kidney disease, stage 3b: Secondary | ICD-10-CM | POA: Diagnosis not present

## 2022-09-08 DIAGNOSIS — I080 Rheumatic disorders of both mitral and aortic valves: Secondary | ICD-10-CM | POA: Diagnosis not present

## 2022-09-08 DIAGNOSIS — D631 Anemia in chronic kidney disease: Secondary | ICD-10-CM | POA: Diagnosis not present

## 2022-09-08 DIAGNOSIS — J9601 Acute respiratory failure with hypoxia: Secondary | ICD-10-CM | POA: Diagnosis not present

## 2022-09-08 DIAGNOSIS — I5033 Acute on chronic diastolic (congestive) heart failure: Secondary | ICD-10-CM | POA: Diagnosis not present

## 2022-09-08 DIAGNOSIS — I130 Hypertensive heart and chronic kidney disease with heart failure and stage 1 through stage 4 chronic kidney disease, or unspecified chronic kidney disease: Secondary | ICD-10-CM | POA: Diagnosis not present

## 2022-09-08 DIAGNOSIS — I480 Paroxysmal atrial fibrillation: Secondary | ICD-10-CM | POA: Diagnosis not present

## 2022-09-08 DIAGNOSIS — I459 Conduction disorder, unspecified: Secondary | ICD-10-CM | POA: Diagnosis not present

## 2022-09-08 DIAGNOSIS — E1122 Type 2 diabetes mellitus with diabetic chronic kidney disease: Secondary | ICD-10-CM | POA: Diagnosis not present

## 2022-09-08 DIAGNOSIS — N1832 Chronic kidney disease, stage 3b: Secondary | ICD-10-CM | POA: Diagnosis not present

## 2022-09-11 ENCOUNTER — Encounter: Payer: Self-pay | Admitting: Cardiology

## 2022-09-11 NOTE — Telephone Encounter (Signed)
From patient.

## 2022-09-13 ENCOUNTER — Other Ambulatory Visit: Payer: Self-pay | Admitting: Orthopedic Surgery

## 2022-09-13 DIAGNOSIS — M545 Low back pain, unspecified: Secondary | ICD-10-CM

## 2022-09-15 DIAGNOSIS — N1832 Chronic kidney disease, stage 3b: Secondary | ICD-10-CM | POA: Diagnosis not present

## 2022-09-15 DIAGNOSIS — I480 Paroxysmal atrial fibrillation: Secondary | ICD-10-CM | POA: Diagnosis not present

## 2022-09-15 DIAGNOSIS — D631 Anemia in chronic kidney disease: Secondary | ICD-10-CM | POA: Diagnosis not present

## 2022-09-15 DIAGNOSIS — I080 Rheumatic disorders of both mitral and aortic valves: Secondary | ICD-10-CM | POA: Diagnosis not present

## 2022-09-15 DIAGNOSIS — I459 Conduction disorder, unspecified: Secondary | ICD-10-CM | POA: Diagnosis not present

## 2022-09-15 DIAGNOSIS — J9601 Acute respiratory failure with hypoxia: Secondary | ICD-10-CM | POA: Diagnosis not present

## 2022-09-15 DIAGNOSIS — E1122 Type 2 diabetes mellitus with diabetic chronic kidney disease: Secondary | ICD-10-CM | POA: Diagnosis not present

## 2022-09-15 DIAGNOSIS — I130 Hypertensive heart and chronic kidney disease with heart failure and stage 1 through stage 4 chronic kidney disease, or unspecified chronic kidney disease: Secondary | ICD-10-CM | POA: Diagnosis not present

## 2022-09-15 DIAGNOSIS — I5033 Acute on chronic diastolic (congestive) heart failure: Secondary | ICD-10-CM | POA: Diagnosis not present

## 2022-09-20 ENCOUNTER — Encounter: Payer: Self-pay | Admitting: Interventional Cardiology

## 2022-09-20 DIAGNOSIS — E113511 Type 2 diabetes mellitus with proliferative diabetic retinopathy with macular edema, right eye: Secondary | ICD-10-CM | POA: Diagnosis not present

## 2022-09-21 ENCOUNTER — Encounter: Payer: Self-pay | Admitting: Cardiology

## 2022-09-21 NOTE — Telephone Encounter (Signed)
From patient.

## 2022-09-22 DIAGNOSIS — I130 Hypertensive heart and chronic kidney disease with heart failure and stage 1 through stage 4 chronic kidney disease, or unspecified chronic kidney disease: Secondary | ICD-10-CM | POA: Diagnosis not present

## 2022-09-22 DIAGNOSIS — I080 Rheumatic disorders of both mitral and aortic valves: Secondary | ICD-10-CM | POA: Diagnosis not present

## 2022-09-22 DIAGNOSIS — J9601 Acute respiratory failure with hypoxia: Secondary | ICD-10-CM | POA: Diagnosis not present

## 2022-09-22 DIAGNOSIS — I480 Paroxysmal atrial fibrillation: Secondary | ICD-10-CM | POA: Diagnosis not present

## 2022-09-22 DIAGNOSIS — E1122 Type 2 diabetes mellitus with diabetic chronic kidney disease: Secondary | ICD-10-CM | POA: Diagnosis not present

## 2022-09-22 DIAGNOSIS — N1832 Chronic kidney disease, stage 3b: Secondary | ICD-10-CM | POA: Diagnosis not present

## 2022-09-22 DIAGNOSIS — D631 Anemia in chronic kidney disease: Secondary | ICD-10-CM | POA: Diagnosis not present

## 2022-09-22 DIAGNOSIS — I5033 Acute on chronic diastolic (congestive) heart failure: Secondary | ICD-10-CM | POA: Diagnosis not present

## 2022-09-22 DIAGNOSIS — I459 Conduction disorder, unspecified: Secondary | ICD-10-CM | POA: Diagnosis not present

## 2022-09-27 ENCOUNTER — Inpatient Hospital Stay: Admission: RE | Admit: 2022-09-27 | Payer: Medicare HMO | Source: Ambulatory Visit

## 2022-10-02 DIAGNOSIS — M5416 Radiculopathy, lumbar region: Secondary | ICD-10-CM | POA: Diagnosis not present

## 2022-10-02 DIAGNOSIS — M48062 Spinal stenosis, lumbar region with neurogenic claudication: Secondary | ICD-10-CM | POA: Diagnosis not present

## 2022-10-12 ENCOUNTER — Ambulatory Visit: Payer: Medicare HMO | Admitting: Cardiology

## 2022-10-12 ENCOUNTER — Encounter: Payer: Self-pay | Admitting: Cardiology

## 2022-10-12 ENCOUNTER — Other Ambulatory Visit: Payer: Self-pay | Admitting: Orthopedic Surgery

## 2022-10-12 VITALS — BP 131/58 | HR 79 | Resp 17 | Ht 63.0 in | Wt 186.0 lb

## 2022-10-12 DIAGNOSIS — I5032 Chronic diastolic (congestive) heart failure: Secondary | ICD-10-CM

## 2022-10-12 DIAGNOSIS — I340 Nonrheumatic mitral (valve) insufficiency: Secondary | ICD-10-CM

## 2022-10-12 DIAGNOSIS — I10 Essential (primary) hypertension: Secondary | ICD-10-CM | POA: Diagnosis not present

## 2022-10-12 DIAGNOSIS — M48061 Spinal stenosis, lumbar region without neurogenic claudication: Secondary | ICD-10-CM

## 2022-10-12 DIAGNOSIS — I350 Nonrheumatic aortic (valve) stenosis: Secondary | ICD-10-CM

## 2022-10-12 DIAGNOSIS — I480 Paroxysmal atrial fibrillation: Secondary | ICD-10-CM

## 2022-10-12 NOTE — Progress Notes (Signed)
TOC visit  Patient referred by Blair Heys, MD for shortness of breath  Subjective:   Danielle Beck, female    DOB: 10-17-1950, 72 y.o.   MRN: [id number]  Chief Complaint  Patient presents with   Chronic heart failure with preserved ejection fraction   Follow-up   HPI  72 y.o. female with hypertension, diabetes mellitus, HFpEF, paroxysmal Afib/flutter, PSVT, mod AS, mod MR, HFPF.  Patient is doing well. Weight at home is staying around 182 lb with torsemide 40 mg in am, 20 mg in pm, with additional doses of torsemide 20 mg 2-3 times a week. She denies overt exertional dyspnea, leg edema.    Current Outpatient Medications:    amLODipine (NORVASC) 10 MG tablet, Take 10 mg by mouth daily. , Disp: , Rfl:    atorvastatin (LIPITOR) 10 MG tablet, Take 10 mg by mouth daily., Disp: , Rfl:    calcium-vitamin D (OSCAL WITH D) 250-125 MG-UNIT tablet, Take 1 tablet by mouth 2 (two) times daily. , Disp: , Rfl:    dapagliflozin propanediol (FARXIGA) 10 MG TABS tablet, Take 1 tablet (10 mg total) by mouth daily., Disp: 1 tablet, Rfl: 0   dorzolamide (TRUSOPT) 2 % ophthalmic solution, Place 1 drop into both eyes 2 (two) times daily., Disp: , Rfl:    Ferrous Sulfate (IRON) 325 (65 Fe) MG TABS, Take 325 mg by mouth daily., Disp: , Rfl:    gabapentin (NEURONTIN) 300 MG capsule, Take 1 capsule (300 mg total) by mouth 2 (two) times daily., Disp: 60 capsule, Rfl: 0   LANTUS SOLOSTAR 100 UNIT/ML Solostar Pen, Inject 30 Units into the skin daily., Disp: , Rfl:    latanoprost (XALATAN) 0.005 % ophthalmic solution, Place 1 drop into both eyes at bedtime. , Disp: , Rfl:    losartan (COZAAR) 25 MG tablet, Take 1 tablet (25 mg total) by mouth daily. (Patient taking differently: Take 50 mg by mouth daily.), Disp: 30 tablet, Rfl: 3   metFORMIN (GLUCOPHAGE) 1000 MG tablet, Take 1 tablet by mouth in the morning and at bedtime., Disp: , Rfl:    methocarbamol (ROBAXIN) 500 MG tablet, Take 500-1,000 mg by  mouth every 6 (six) hours as needed for muscle spasms., Disp: , Rfl:    metoprolol tartrate (LOPRESSOR) 25 MG tablet, TAKE 1 TABLET TWICE DAILY (Patient taking differently: Take 25 mg by mouth 2 (two) times daily.), Disp: 180 tablet, Rfl: 3   nitroGLYCERIN (NITROSTAT) 0.4 MG SL tablet, Place 1 tablet (0.4 mg total) under the tongue every 5 (five) minutes as needed for chest pain., Disp: 30 tablet, Rfl: 3   pyridOXINE (VITAMIN B-6) 100 MG tablet, Take 100 mg by mouth daily., Disp: , Rfl:    Rivaroxaban (XARELTO) 15 MG TABS tablet, Take 1 tablet (15 mg total) by mouth daily with supper., Disp: 90 tablet, Rfl: 3   thiamine (VITAMIN B-1) 100 MG tablet, Take 100 mg by mouth daily., Disp: , Rfl:    timolol (TIMOPTIC) 0.5 % ophthalmic solution, Place 1 drop into both eyes 2 (two) times daily., Disp: , Rfl:    torsemide (DEMADEX) 20 MG tablet, Take 1 tablet (20 mg total) by mouth 2 (two) times daily. Take 2 tablets in the morning and 1 tablet in the afternoon. (Patient taking differently: Take 20 mg by mouth 2 (two) times daily. Take 2 tablets in the morning and 1 tablet in the afternoon. PRN one more in the afternoon), Disp: 120 tablet, Rfl: 3   traMADol (ULTRAM)  50 MG tablet, Take 50 mg by mouth every 6 (six) hours as needed for moderate pain., Disp: , Rfl:    vitamin B-12 (CYANOCOBALAMIN) 100 MCG tablet, Take 100 mcg by mouth daily., Disp: , Rfl:   Cardiovascular studies:  EKG 08/31/2022: Sinus rhythm 79 bpm LAFB LVH Possible old anteroseptal infarct  Echocardiogram 08/21/2022: 1. Left ventricular ejection fraction, by estimation, is 55 to 60%. The  left ventricle has normal function. The left ventricle has no regional  wall motion abnormalities. Left ventricular diastolic parameters are  indeterminate.   2. Right ventricular systolic function is normal. The right ventricular  size is normal.   3. The aortic valve is calcified. Moderate aortic valve stenosis. Aortic  valve area, by VTI measures  1.05 cm. Aortic valve mean gradient measures  18.0 mmHg. Aortic valve Vmax measures 2.71 m/s.   4. The inferior vena cava is normal in size with <50% respiratory  variability, suggesting right atrial pressure of 8 mmHg.   Comparison(s): A prior study was performed on 04/06/2022. AS and MR are no  worse than before.   Lexiscan  Nuclear stress test 05/04/2022: Myocardial perfusion is abnormal. Breast tissue attenuation noted in inferior wall. Cannot exclude mild degree medium extent perfusion defect consistent with ischemia located in the apical inferior wall and mid inferior wall. The LV appears slightly dilated in the stress images, although TID index is insignificant. Overall LV systolic function is normal without regional wall motion abnormalities.  Stress LV EF: 54%.  Non-diagnostic ECG stress. The heart rate response was consistent with Regadenoson.  Compared to previous study 03/24/2019, soft tissue attenuation was previously noted. Possible inferior ischemia is new.  Intermediate risk.    Real time outpatient cardiac telemetry 09/23/2019: Monitoring period 360 hr 41 min HR 56-170 bpm. Avg HR 83 bpm Rare ventricular ectopy (PVC/pair) Frequent PAC's and occasional atrial bigeminy in sinus rhythm.  Multiple runs of SVT at 150 bpm with associated T wave inversions, followed by coarse Afib/atrial flutter episodes SVT burden <1%, Afib burden <1% No symptoms associated with above arrhythmia. Patient activated events correlate with sinus rhythm. No VT/high grade AV block, sinus pause >3sec noted.  ABI 09/12/2019:  This exam reveals normal perfusion of the lower extremity (ABI 1.07 bilateral).   Triphasic (normal) waveform noted at the level of the ankle.   Recent labs: 09/04/2022: Glucose 114, BUN/Cr 42/1.51. EGFR 37. Na/K 140/4.6.  NT-Pro BNP 577 H/H 9/25. MCV 89. Platelets 246 HbA1C 7.4%  08/28/2022: NT-Pro BNP 620   Latest Reference Range & Units 03/05/19 18:00 08/19/22 04:15  B  Natriuretic Peptide 0.0 - 100.0 pg/mL 393.5 (H) 663.9 (H)  (H): Data is abnormally high  Review of Systems  Cardiovascular:  Negative for chest pain, dyspnea on exertion, leg swelling, palpitations and syncope.  All other systems reviewed and are negative.      Vitals:   10/12/22 1057  BP: (!) 131/58  Pulse: 79  Resp: 17  SpO2: 98%   Filed Weights   10/12/22 1057  Weight: 186 lb (84.4 kg)       Objective:    Physical exam: Physical Exam Vitals and nursing note reviewed.  Constitutional:      General: She is not in acute distress. Neck:     Vascular: No JVD.  Cardiovascular:     Rate and Rhythm: Normal rate and regular rhythm.     Heart sounds: Murmur heard.     Harsh midsystolic murmur is present with a grade of 2/6  at the upper right sternal border radiating to the neck.     High-pitched blowing holosystolic murmur of grade 2/6 is also present at the apex.  Pulmonary:     Effort: Pulmonary effort is normal.     Breath sounds: Normal breath sounds. No wheezing or rales.  Musculoskeletal:     Right lower leg: No edema.     Left lower leg: No edema.     Visit diagnoses:    ICD-10-CM   1. Chronic heart failure with preserved ejection fraction (HCC)  I50.32     2. Moderate mitral regurgitation  I34.0     3. Moderate aortic stenosis  I35.0     4. PAF (paroxysmal atrial fibrillation) (HCC)  I48.0     5. Essential hypertension  I10          Assessment & Recommendations:   72 y.o. female with hypertension, diabetes mellitus, HFpEF, mod AS, mod MR,  paroxysmal Afib/flutter, PSVT  HFpEF: Chronic, controlled.  Continue losartan 50 mg daily, Farxiga 10 mg daily.  Continue torsemide 40 mg in am and 20 mg in pm, with additional 20 mg as needed for leg edema, dyspnea.   AS, MR: Mod MS, mod MR. Repeat echocardiogram in Oct-Nov 2024.  Paroxysmal Afib/flutter + PSVT: Improved. CHA2DS2VASc score 4, annual stroke risk 5%. Continue Xarelto 15 mg daily, given  GFR <50. Stable normocytic anemia. Continue Xarelto 15 mg daily.  Hypertension: Controlled  F/u in 6 months    Elder Negus, MD Pager: [phone] Office: [phone]

## 2022-10-19 ENCOUNTER — Ambulatory Visit
Admission: RE | Admit: 2022-10-19 | Discharge: 2022-10-19 | Disposition: A | Discharge status: Home / Self Care | Payer: Medicare HMO | Source: Ambulatory Visit | Attending: Orthopedic Surgery | Admitting: Orthopedic Surgery

## 2022-10-19 DIAGNOSIS — M4727 Other spondylosis with radiculopathy, lumbosacral region: Secondary | ICD-10-CM | POA: Diagnosis not present

## 2022-10-19 DIAGNOSIS — M48061 Spinal stenosis, lumbar region without neurogenic claudication: Secondary | ICD-10-CM

## 2022-10-19 DIAGNOSIS — M79604 Pain in right leg: Secondary | ICD-10-CM | POA: Diagnosis not present

## 2022-10-19 DIAGNOSIS — M47817 Spondylosis without myelopathy or radiculopathy, lumbosacral region: Secondary | ICD-10-CM | POA: Diagnosis not present

## 2022-10-19 MED ORDER — METHYLPREDNISOLONE ACETATE 40 MG/ML INJ SUSP (RADIOLOG
80.0000 mg | Freq: Once | INTRAMUSCULAR | Status: AC
  Administered 2022-10-19: 80 mg via EPIDURAL

## 2022-10-19 MED ORDER — IOPAMIDOL (ISOVUE-M 200) INJECTION 41%
1.0000 mL | Freq: Once | INTRAMUSCULAR | Status: AC
  Administered 2022-10-19: 1 mL via EPIDURAL

## 2022-10-19 NOTE — Discharge Instructions (Signed)

## 2022-10-26 ENCOUNTER — Other Ambulatory Visit: Payer: Self-pay | Admitting: Cardiology

## 2022-10-26 DIAGNOSIS — I480 Paroxysmal atrial fibrillation: Secondary | ICD-10-CM

## 2022-11-02 ENCOUNTER — Other Ambulatory Visit: Payer: Self-pay | Admitting: Family Medicine

## 2022-11-02 DIAGNOSIS — E113512 Type 2 diabetes mellitus with proliferative diabetic retinopathy with macular edema, left eye: Secondary | ICD-10-CM | POA: Diagnosis not present

## 2022-11-02 DIAGNOSIS — Z1231 Encounter for screening mammogram for malignant neoplasm of breast: Secondary | ICD-10-CM

## 2022-11-20 DIAGNOSIS — N189 Chronic kidney disease, unspecified: Secondary | ICD-10-CM | POA: Diagnosis not present

## 2022-11-20 DIAGNOSIS — R809 Proteinuria, unspecified: Secondary | ICD-10-CM | POA: Diagnosis not present

## 2022-11-20 DIAGNOSIS — N1832 Chronic kidney disease, stage 3b: Secondary | ICD-10-CM | POA: Diagnosis not present

## 2022-11-20 DIAGNOSIS — E559 Vitamin D deficiency, unspecified: Secondary | ICD-10-CM | POA: Diagnosis not present

## 2022-11-20 DIAGNOSIS — I130 Hypertensive heart and chronic kidney disease with heart failure and stage 1 through stage 4 chronic kidney disease, or unspecified chronic kidney disease: Secondary | ICD-10-CM | POA: Diagnosis not present

## 2022-11-20 DIAGNOSIS — D631 Anemia in chronic kidney disease: Secondary | ICD-10-CM | POA: Diagnosis not present

## 2022-11-20 DIAGNOSIS — I509 Heart failure, unspecified: Secondary | ICD-10-CM | POA: Diagnosis not present

## 2022-11-20 DIAGNOSIS — E872 Acidosis, unspecified: Secondary | ICD-10-CM | POA: Diagnosis not present

## 2022-11-22 ENCOUNTER — Other Ambulatory Visit: Payer: Medicare HMO

## 2022-11-29 DIAGNOSIS — E113511 Type 2 diabetes mellitus with proliferative diabetic retinopathy with macular edema, right eye: Secondary | ICD-10-CM | POA: Diagnosis not present

## 2022-12-18 ENCOUNTER — Ambulatory Visit
Admission: RE | Admit: 2022-12-18 | Discharge: 2022-12-18 | Disposition: A | Discharge status: Home / Self Care | Payer: Medicare HMO | Source: Ambulatory Visit | Attending: Family Medicine | Admitting: Family Medicine

## 2022-12-18 DIAGNOSIS — Z1231 Encounter for screening mammogram for malignant neoplasm of breast: Secondary | ICD-10-CM

## 2022-12-28 DIAGNOSIS — H40053 Ocular hypertension, bilateral: Secondary | ICD-10-CM | POA: Diagnosis not present

## 2022-12-28 DIAGNOSIS — E113313 Type 2 diabetes mellitus with moderate nonproliferative diabetic retinopathy with macular edema, bilateral: Secondary | ICD-10-CM | POA: Diagnosis not present

## 2022-12-28 DIAGNOSIS — Z794 Long term (current) use of insulin: Secondary | ICD-10-CM | POA: Diagnosis not present

## 2022-12-28 DIAGNOSIS — Z961 Presence of intraocular lens: Secondary | ICD-10-CM | POA: Diagnosis not present

## 2023-01-05 DIAGNOSIS — I130 Hypertensive heart and chronic kidney disease with heart failure and stage 1 through stage 4 chronic kidney disease, or unspecified chronic kidney disease: Secondary | ICD-10-CM | POA: Diagnosis not present

## 2023-01-05 DIAGNOSIS — E1139 Type 2 diabetes mellitus with other diabetic ophthalmic complication: Secondary | ICD-10-CM | POA: Diagnosis not present

## 2023-01-05 DIAGNOSIS — N1832 Chronic kidney disease, stage 3b: Secondary | ICD-10-CM | POA: Diagnosis not present

## 2023-01-05 DIAGNOSIS — I700 Atherosclerosis of aorta: Secondary | ICD-10-CM | POA: Diagnosis not present

## 2023-01-05 DIAGNOSIS — Z1331 Encounter for screening for depression: Secondary | ICD-10-CM | POA: Diagnosis not present

## 2023-01-05 DIAGNOSIS — E7800 Pure hypercholesterolemia, unspecified: Secondary | ICD-10-CM | POA: Diagnosis not present

## 2023-01-05 DIAGNOSIS — E1122 Type 2 diabetes mellitus with diabetic chronic kidney disease: Secondary | ICD-10-CM | POA: Diagnosis not present

## 2023-01-05 DIAGNOSIS — I5032 Chronic diastolic (congestive) heart failure: Secondary | ICD-10-CM | POA: Diagnosis not present

## 2023-01-05 DIAGNOSIS — I10 Essential (primary) hypertension: Secondary | ICD-10-CM | POA: Diagnosis not present

## 2023-01-05 DIAGNOSIS — I480 Paroxysmal atrial fibrillation: Secondary | ICD-10-CM | POA: Diagnosis not present

## 2023-01-05 DIAGNOSIS — Z0000 Encounter for general adult medical examination without abnormal findings: Secondary | ICD-10-CM | POA: Diagnosis not present

## 2023-01-17 ENCOUNTER — Other Ambulatory Visit: Payer: Self-pay

## 2023-01-17 ENCOUNTER — Encounter: Payer: Self-pay | Admitting: Cardiology

## 2023-01-17 MED ORDER — LOSARTAN POTASSIUM 50 MG PO TABS: 50.0000 mg | ORAL_TABLET | Freq: Every day | ORAL | 3 refills | Status: DC

## 2023-01-25 DIAGNOSIS — E113512 Type 2 diabetes mellitus with proliferative diabetic retinopathy with macular edema, left eye: Secondary | ICD-10-CM | POA: Diagnosis not present

## 2023-02-09 DIAGNOSIS — D123 Benign neoplasm of transverse colon: Secondary | ICD-10-CM | POA: Diagnosis not present

## 2023-02-09 DIAGNOSIS — Z86010 Personal history of colonic polyps: Secondary | ICD-10-CM | POA: Diagnosis not present

## 2023-02-09 DIAGNOSIS — K648 Other hemorrhoids: Secondary | ICD-10-CM | POA: Diagnosis not present

## 2023-02-09 DIAGNOSIS — Z09 Encounter for follow-up examination after completed treatment for conditions other than malignant neoplasm: Secondary | ICD-10-CM | POA: Diagnosis not present

## 2023-02-12 DIAGNOSIS — M48062 Spinal stenosis, lumbar region with neurogenic claudication: Secondary | ICD-10-CM | POA: Diagnosis not present

## 2023-02-13 ENCOUNTER — Other Ambulatory Visit: Payer: Self-pay | Admitting: Orthopedic Surgery

## 2023-02-13 DIAGNOSIS — M545 Low back pain, unspecified: Secondary | ICD-10-CM

## 2023-02-13 DIAGNOSIS — D123 Benign neoplasm of transverse colon: Secondary | ICD-10-CM | POA: Diagnosis not present

## 2023-02-16 ENCOUNTER — Other Ambulatory Visit: Payer: Self-pay | Admitting: Cardiology

## 2023-02-16 DIAGNOSIS — R600 Localized edema: Secondary | ICD-10-CM

## 2023-02-16 DIAGNOSIS — I5032 Chronic diastolic (congestive) heart failure: Secondary | ICD-10-CM

## 2023-02-19 MED ORDER — TORSEMIDE 20 MG PO TABS
20.0000 mg | ORAL_TABLET | Freq: Two times a day (BID) | ORAL | 0 refills | Status: AC
Start: 2023-02-19 — End: ?

## 2023-02-21 DIAGNOSIS — E113511 Type 2 diabetes mellitus with proliferative diabetic retinopathy with macular edema, right eye: Secondary | ICD-10-CM | POA: Diagnosis not present

## 2023-02-22 ENCOUNTER — Encounter: Payer: Self-pay | Admitting: Cardiology

## 2023-02-22 ENCOUNTER — Encounter: Payer: Self-pay | Admitting: Orthopedic Surgery

## 2023-02-22 DIAGNOSIS — R600 Localized edema: Secondary | ICD-10-CM

## 2023-02-22 DIAGNOSIS — I5032 Chronic diastolic (congestive) heart failure: Secondary | ICD-10-CM

## 2023-02-23 ENCOUNTER — Telehealth: Payer: Self-pay

## 2023-02-23 ENCOUNTER — Other Ambulatory Visit: Payer: Self-pay

## 2023-02-23 DIAGNOSIS — I5032 Chronic diastolic (congestive) heart failure: Secondary | ICD-10-CM

## 2023-02-23 DIAGNOSIS — R600 Localized edema: Secondary | ICD-10-CM

## 2023-02-23 MED ORDER — TORSEMIDE 20 MG PO TABS
ORAL_TABLET | ORAL | 2 refills | Status: DC
Start: 2023-02-23 — End: 2023-07-19

## 2023-02-23 MED ORDER — TORSEMIDE 20 MG PO TABS
ORAL_TABLET | ORAL | 2 refills | Status: DC
Start: 2023-02-23 — End: 2023-02-23

## 2023-02-23 NOTE — Telephone Encounter (Signed)
Spoke with pt's husband who walked in requesting a clearance letter for pt to have an epidural injection next Thursday 03/01/2023 with [facility] at the request of Dr Estill Bamberg, [facility]. Pt's husband advised his wife will need to complete a DPR release form to give permission to speak with him.  Pt's husband advised of [facility] pre-op policy.  Pt 's husband states he could walk into [facility] and walk out with anything that he or his wife needed.  Pt's husband will forward request to our pre-op team for review and advisement.  He states he will be expecting a call by Monday morning as pt will need to begin holding Xarelto on Monday evening.

## 2023-02-23 NOTE — Telephone Encounter (Signed)
I will forward back to pre op APP if ok to add on 02/26/23 @ 3:20 as an overbook. Per pt's husband the procedure is 03/01/23.

## 2023-02-23 NOTE — Telephone Encounter (Signed)
Pre-operative Risk Assessment    Patient Name: Danielle Beck  DOB: Jul 07, 1950 MRN: [id number]    DATE OF LAST VISIT: 10/12/22 DR. PATWARDHAN DATE OF NEXT VISIT: 03/16/23 DR. PATWARDHAN AFTER ECHO ON 03/12/23  Request for Surgical Clearance    Procedure:   LUMBAR EPIDURAL  Date of Surgery:  Clearance TBD (THOUGH PT'S HUSBAND TOLD RN, MARSHA IN OUR OFFICE PROCEDURE IS 103/24)                                Surgeon:  NOT LISTED Surgeon's Group or Practice Name:  [facility]  Phone number:  [phone] Fax number:  [phone]   Type of Clearance Requested:   - Medical  - Pharmacy:  Hold Rivaroxaban (Xarelto) x 2 DOSES   Type of Anesthesia:  Local    Additional requests/questions:    Elpidio Anis   02/23/2023, 4:27 PM

## 2023-02-23 NOTE — Telephone Encounter (Signed)
Patient with diagnosis of afib on xarelto for anticoagulation.    Procedure: LUMBAR EPIDURAL  Date of procedure:  Clearance TBD (THOUGH PT'S HUSBAND TOLD RN, MARSHA IN OUR OFFICE PROCEDURE IS 03/01/23)    CHA2DS2-VASc Score = 5   This indicates a 7.2% annual risk of stroke. The patient's score is based upon: CHF History: 1 HTN History: 1 Diabetes History: 1 Stroke History: 0 Vascular Disease History: 0 Age Score: 1 Gender Score: 1     CrCl 41 mL/min Platelet count 270 K (11/20/2022 from [facility])    Per office protocol, patient can hold Xarelto  for 3 days prior to procedure.     **This guidance is not considered finalized until pre-operative APP has relayed final recommendations.**

## 2023-02-23 NOTE — Telephone Encounter (Signed)
RN, Mindi Junker d/w me the phone note that she sent to me. I stated that I will call [facility] to get an official clearance request faxed to our office [phone] attn: pre op team. Once I have this I will present to Dr. Rosemary Holms and our pre op APP as well as Pre op Pharm-d.

## 2023-02-23 NOTE — Telephone Encounter (Signed)
Name: Danielle Beck  DOB: April 06, 1951  MRN: [id number]  Primary Cardiologist: None   Preoperative team, please contact this patient and set up a phone call appointment for further preoperative risk assessment. Please obtain consent and complete medication review. Thank you for your help.  I confirm that guidance regarding antiplatelet and oral anticoagulation therapy has been completed and, if necessary, noted below.   I also confirmed the patient resides in the state of [place]. As per [facility] Medical Board telemedicine laws, the patient must reside in the state in which the provider is licensed.  Patient with diagnosis of afib on xarelto for anticoagulation.     Procedure: LUMBAR EPIDURAL  Date of procedure:  Clearance TBD (THOUGH PT'S HUSBAND TOLD RN, MARSHA IN OUR OFFICE PROCEDURE IS 03/01/23)      CHA2DS2-VASc Score = 5   This indicates a 7.2% annual risk of stroke. The patient's score is based upon: CHF History: 1 HTN History: 1 Diabetes History: 1 Stroke History: 0 Vascular Disease History: 0 Age Score: 1 Gender Score: 1       CrCl 41 mL/min Platelet count 270 K (11/20/2022 from [facility])       Per office protocol, patient can hold Xarelto  for 3 days prior to procedure.  Ronney Asters, NP 02/23/2023, 4:57 PM [facility]

## 2023-02-23 NOTE — Telephone Encounter (Signed)
Operator Michaelle Copas. good afternoon! office is returning your call. are you available to speak to her?    I called back and left message to call back directly to pre op line [phone].

## 2023-02-25 NOTE — Telephone Encounter (Signed)
Agree with above recommendations.  Thanks MJP

## 2023-02-26 ENCOUNTER — Ambulatory Visit: Payer: Medicare HMO | Attending: Nurse Practitioner

## 2023-02-26 ENCOUNTER — Telehealth: Payer: Self-pay | Admitting: *Deleted

## 2023-02-26 DIAGNOSIS — Z01810 Encounter for preprocedural cardiovascular examination: Secondary | ICD-10-CM

## 2023-02-26 NOTE — Progress Notes (Signed)
Virtual Visit via Telephone Note   Because of Danielle Beck's co-morbid illnesses, she is at least at moderate risk for complications without adequate follow up.  This format is felt to be most appropriate for this patient at this time. All issues noted in this document were discussed and addressed.  No physical exam could be performed with this format.  Please refer to the patient's chart for her consent to telehealth for [facility].  Evaluation Performed:  Preoperative cardiovascular risk assessment _____________   Date:  02/26/2023   Patient ID:  Danielle Beck, DOB Jun 03, 1950, MRN [id number] Patient Location:  Home Provider location:   Office  Primary Care Provider:  Blair Heys, MD Primary Cardiologist:  None  Chief Complaint / Patient Profile   72 y.o. y/o female with a h/o hypertension, diabetes mellitus, HFpEF, paroxysmal Afib/flutter, PSVT, mod AS, mod MR, HFPF  who is pending lumbar ESI and presents today for telephonic preoperative cardiovascular risk assessment.  History of Present Illness    Danielle Beck is a 72 y.o. female who presents via audio/video conferencing for a telehealth visit today.  She was accompanied today by her husband who helped with answering questions regarding her health.  Pt was last seen in cardiology clinic on 10/12/2022 by  Dr. Rosemary Holms. At that time Danielle Beck was doing well with no new cardiac complaints.  The patient is now pending procedure as outlined above. Since her last visit, she reports no changes to her cardiac health.  She denies chest pain, shortness of breath, lower extremity edema, fatigue, palpitations, melena, hematuria, hemoptysis, diaphoresis, weakness, presyncope, syncope, orthopnea, and PND.   Patient can hold Xarelto 3 days prior to procedure  Past Medical History    Past Medical History:  Diagnosis Date   CHF (congestive heart failure) (HCC)    Diabetes mellitus without  complication (HCC)    Hyperlipidemia    Hypertension    Past Surgical History:  Procedure Laterality Date   CATARACT EXTRACTION     TUBAL LIGATION  1985    Allergies  No Known Allergies  Home Medications    Prior to Admission medications   Medication Sig Start Date End Date Taking? Authorizing Provider  amLODipine (NORVASC) 10 MG tablet Take 10 mg by mouth daily.  01/26/19   [provider]  atorvastatin (LIPITOR) 10 MG tablet Take 10 mg by mouth daily. 12/31/18   [provider]  calcium-vitamin D (OSCAL WITH D) 250-125 MG-UNIT tablet Take 1 tablet by mouth 2 (two) times daily.     [provider]  dapagliflozin propanediol (FARXIGA) 10 MG TABS tablet Take 1 tablet (10 mg total) by mouth daily. 08/31/22   Patwardhan, Manish J, MD  dorzolamide (TRUSOPT) 2 % ophthalmic solution Place 1 drop into both eyes 2 (two) times daily. 12/25/18   [provider]  Ferrous Sulfate (IRON) 325 (65 Fe) MG TABS Take 325 mg by mouth daily.    [provider]  gabapentin (NEURONTIN) 300 MG capsule Take 1 capsule (300 mg total) by mouth 2 (two) times daily. Patient not taking: Reported on 02/26/2023 08/22/22 10/12/22  Arrien, York Ram, MD  LANTUS SOLOSTAR 100 UNIT/ML Solostar Pen Inject 30 Units into the skin daily. 02/06/19   [provider]  latanoprost (XALATAN) 0.005 % ophthalmic solution Place 1 drop into both eyes at bedtime.  12/25/18   [provider]  losartan (COZAAR) 50 MG tablet Take 1 tablet (50 mg total) by mouth daily.  01/17/23 01/12/24  Patwardhan, Anabel Bene, MD  LYRICA 75 MG capsule Take 75 mg by mouth 2 (two) times daily. 02/12/23   [provider]  metFORMIN (GLUCOPHAGE) 1000 MG tablet Take 1 tablet by mouth in the morning and at bedtime. 11/26/19   [provider]  methocarbamol (ROBAXIN) 500 MG tablet Take 500-1,000 mg by mouth every 6 (six) hours as needed for muscle spasms. 04/10/22   [provider]   metoprolol tartrate (LOPRESSOR) 25 MG tablet TAKE 1 TABLET TWICE DAILY Patient taking differently: Take 25 mg by mouth 2 (two) times daily. 08/18/22   Patwardhan, Anabel Bene, MD  nitroGLYCERIN (NITROSTAT) 0.4 MG SL tablet Place 1 tablet (0.4 mg total) under the tongue every 5 (five) minutes as needed for chest pain. Patient not taking: Reported on 02/26/2023 04/17/22 10/12/22  Elder Negus, MD  pyridOXINE (VITAMIN B-6) 100 MG tablet Take 100 mg by mouth daily.    [provider]  thiamine (VITAMIN B-1) 100 MG tablet Take 100 mg by mouth daily.    [provider]  timolol (TIMOPTIC) 0.5 % ophthalmic solution Place 1 drop into both eyes 2 (two) times daily. 12/26/18   [provider]  torsemide (DEMADEX) 20 MG tablet Take 2 tablets by mouth in the morning and 1 tablet by mouth in the afternoon. May take additional 1 tablet as needed for leg edema, dyspnea. 02/23/23   Patwardhan, Anabel Bene, MD  traMADol (ULTRAM) 50 MG tablet Take 50 mg by mouth every 6 (six) hours as needed for moderate pain. 12/26/21   [provider]  vitamin B-12 (CYANOCOBALAMIN) 100 MCG tablet Take 100 mcg by mouth daily.    [provider]  XARELTO 15 MG TABS tablet TAKE 1 TABLET EVERY DAY WITH SUPPER 10/27/22   Patwardhan, Anabel Bene, MD    Physical Exam    Vital Signs:  Danielle Beck does not have vital signs available for review today.  Given telephonic nature of communication, physical exam is limited. AAOx3. NAD. Normal affect.  Speech and respirations are unlabored.  Accessory Clinical Findings    None  Assessment & Plan    1.  Preoperative Cardiovascular Risk Assessment: -Patient's RCRI score is 0.9%  The patient affirms she has been doing well without any new cardiac symptoms. They are able to achieve 4 METS without cardiac limitations. Therefore, based on ACC/AHA guidelines, the patient would be at acceptable risk for the planned procedure without further  cardiovascular testing. The patient was advised that if she develops new symptoms prior to surgery to contact our office to arrange for a follow-up visit, and she verbalized understanding.   The patient was advised that if she develops new symptoms prior to surgery to contact our office to arrange for a follow-up visit, and she verbalized understanding.  Patient can hold Xarelto 3 days prior to procedure and should be restarted postprocedure when hemostasis is achieved.  A copy of this note will be routed to requesting surgeon.  Time:   Today, I have spent 8 minutes with the patient with telehealth technology discussing medical history, symptoms, and management plan.     Napoleon Form, Leodis Rains, NP  02/26/2023, 8:29 AM

## 2023-02-26 NOTE — Telephone Encounter (Signed)
I s/w the pt and her husband and have scheduled tele pre op appt added on today per Robin Searing, NP pre op APP due to procedure date and med hold.    Med rec and consent are done.

## 2023-02-26 NOTE — Telephone Encounter (Signed)
Please go ahead and add patient on for 3:20 pm slot on 02/26/2023.  Thanks, Robin Searing, NP

## 2023-02-26 NOTE — Telephone Encounter (Signed)
I s/w the pt and her husband and have scheduled tele pre op appt added on today per Robin Searing, NP pre op APP due to procedure date and med hold.   Med rec and consent are done.     Patient Consent for Virtual Visit        Danielle Beck has provided verbal consent on 02/26/2023 for a virtual visit (video or telephone).   CONSENT FOR VIRTUAL VISIT FOR:  Danielle Beck  By participating in this virtual visit I agree to the following:  I hereby voluntarily request, consent and authorize [facility] and its employed or contracted physicians, physician assistants, nurse practitioners or other licensed health care professionals (the Practitioner), to provide me with telemedicine health care services (the "Services") as deemed necessary by the treating Practitioner. I acknowledge and consent to receive the Services by the Practitioner via telemedicine. I understand that the telemedicine visit will involve communicating with the Practitioner through live audiovisual communication technology and the disclosure of certain medical information by electronic transmission. I acknowledge that I have been given the opportunity to request an in-person assessment or other available alternative prior to the telemedicine visit and am voluntarily participating in the telemedicine visit.  I understand that I have the right to withhold or withdraw my consent to the use of telemedicine in the course of my care at any time, without affecting my right to future care or treatment, and that the Practitioner or I may terminate the telemedicine visit at any time. I understand that I have the right to inspect all information obtained and/or recorded in the course of the telemedicine visit and may receive copies of available information for a reasonable fee.  I understand that some of the potential risks of receiving the Services via telemedicine include:  Delay or interruption in medical evaluation due to  technological equipment failure or disruption; Information transmitted may not be sufficient (e.g. poor resolution of images) to allow for appropriate medical decision making by the Practitioner; and/or  In rare instances, security protocols could fail, causing a breach of personal health information.  Furthermore, I acknowledge that it is my responsibility to provide information about my medical history, conditions and care that is complete and accurate to the best of my ability. I acknowledge that Practitioner's advice, recommendations, and/or decision may be based on factors not within their control, such as incomplete or inaccurate data provided by me or distortions of diagnostic images or specimens that may result from electronic transmissions. I understand that the practice of medicine is not an exact science and that Practitioner makes no warranties or guarantees regarding treatment outcomes. I acknowledge that a copy of this consent can be made available to me via my patient portal [facility] MyChart), or I can request a printed copy by calling the office of [facility].    I understand that my insurance will be billed for this visit.   I have read or had this consent read to me. I understand the contents of this consent, which adequately explains the benefits and risks of the Services being provided via telemedicine.  I have been provided ample opportunity to ask questions regarding this consent and the Services and have had my questions answered to my satisfaction. I give my informed consent for the services to be provided through the use of telemedicine in my medical care

## 2023-02-28 NOTE — Discharge Instructions (Signed)

## 2023-03-01 ENCOUNTER — Ambulatory Visit
Admission: RE | Admit: 2023-03-01 | Discharge: 2023-03-01 | Disposition: A | Discharge status: Home / Self Care | Payer: Medicare HMO | Source: Ambulatory Visit | Attending: Orthopedic Surgery | Admitting: Orthopedic Surgery

## 2023-03-01 DIAGNOSIS — M545 Low back pain, unspecified: Secondary | ICD-10-CM

## 2023-03-01 DIAGNOSIS — M5116 Intervertebral disc disorders with radiculopathy, lumbar region: Secondary | ICD-10-CM | POA: Diagnosis not present

## 2023-03-01 MED ORDER — IOPAMIDOL (ISOVUE-M 200) INJECTION 41%
1.0000 mL | Freq: Once | INTRAMUSCULAR | Status: AC
  Administered 2023-03-01: 1 mL via EPIDURAL

## 2023-03-01 MED ORDER — METHYLPREDNISOLONE ACETATE 40 MG/ML INJ SUSP (RADIOLOG
80.0000 mg | Freq: Once | INTRAMUSCULAR | Status: AC
  Administered 2023-03-01: 80 mg via EPIDURAL

## 2023-03-12 ENCOUNTER — Other Ambulatory Visit: Payer: Medicare HMO

## 2023-03-12 ENCOUNTER — Ambulatory Visit (HOSPITAL_COMMUNITY): Payer: Medicare HMO | Attending: Cardiology

## 2023-03-12 DIAGNOSIS — I209 Angina pectoris, unspecified: Secondary | ICD-10-CM | POA: Diagnosis not present

## 2023-03-12 DIAGNOSIS — I350 Nonrheumatic aortic (valve) stenosis: Secondary | ICD-10-CM | POA: Insufficient documentation

## 2023-03-12 LAB — ECHOCARDIOGRAM COMPLETE
AR max vel: 0.8 cm2
AV Area VTI: 0.79 cm2
AV Area mean vel: 0.73 cm2
AV Mean grad: 23.5 mm[Hg]
AV Peak grad: 41.1 mm[Hg]
Ao pk vel: 3.21 m/s
Area-P 1/2: 4.04 cm2
S' Lateral: 2.9 cm

## 2023-03-12 NOTE — Progress Notes (Signed)
Patient has an appointment to see Dr. Rosemary Holms in the near future in the next few days.  Patient may need further evaluation for aortic stenosis.  MyChart message sent to the patient.

## 2023-03-16 ENCOUNTER — Encounter: Payer: Self-pay | Admitting: Cardiology

## 2023-03-16 ENCOUNTER — Ambulatory Visit: Payer: Medicare HMO | Attending: Cardiology | Admitting: Cardiology

## 2023-03-16 VITALS — BP 110/50 | HR 73 | Resp 15 | Ht 63.0 in | Wt 184.0 lb

## 2023-03-16 DIAGNOSIS — I350 Nonrheumatic aortic (valve) stenosis: Secondary | ICD-10-CM

## 2023-03-16 DIAGNOSIS — I480 Paroxysmal atrial fibrillation: Secondary | ICD-10-CM | POA: Diagnosis not present

## 2023-03-16 DIAGNOSIS — E782 Mixed hyperlipidemia: Secondary | ICD-10-CM | POA: Diagnosis not present

## 2023-03-16 MED ORDER — ATORVASTATIN CALCIUM 20 MG PO TABS
20.0000 mg | ORAL_TABLET | Freq: Every day | ORAL | 3 refills | Status: DC
Start: 2023-03-16 — End: 2023-07-25

## 2023-03-16 NOTE — Progress Notes (Signed)
Cardiology Office Note:  .   Date:  03/16/2023  ID:  Danielle Beck, DOB 04-02-1951, MRN [id number] PCP: Blair Heys, MD  [facility] Providers Cardiologist:  Truett Mainland, MD PCP: Blair Heys, MD  Chief Complaint  Patient presents with   Chronic heart failure with preserved ejection fraction    Essential hypertension   PAF (paroxysmal atrial fibrillation)   Follow-up      History of Present Illness: .    Danielle Beck is a 72 y.o. female with hypertension, diabetes mellitus, HFpEF, paroxysmal Afib/flutter, PSVT, mod AS, mod MR, HFpEF.   Is here today with her husband.  She is doing quite well.  Denies any significant exertional dyspnea symptoms.  Leg remains completely resolved.  She has not had any chest pain, presyncope or syncope episodes.  Discussed recent echocardiogram results with the patient and husband in detail, see below.  Also reviewed external labs from her PCP [facility] physicians, performed in 12/2022.  Vitals:   03/16/23 0911  BP: (!) 110/50  Pulse: 73  Resp: 15  SpO2: 95%     ROS:  Review of Systems  Cardiovascular:  Negative for chest pain, dyspnea on exertion, leg swelling, palpitations and syncope.     Studies Reviewed: Marland Kitchen       External labs 12/2022: Creatinine 1.67, EGFR 33. LDL 90  EKG 08/31/2022: Sinus rhythm 79 bpm LAFB LVH Possible old anteroseptal infarct   Echocardiogram 08/21/2022: 1. Left ventricular ejection fraction, by estimation, is 55 to 60%. The  left ventricle has normal function. The left ventricle has no regional  wall motion abnormalities. Left ventricular diastolic parameters are  indeterminate.   2. Right ventricular systolic function is normal. The right ventricular  size is normal.   3. The aortic valve is calcified. Moderate aortic valve stenosis. Aortic  valve area, by VTI measures 1.05 cm. Aortic valve mean gradient measures  18.0 mmHg. Aortic valve Vmax measures 2.71 m/s.   4. The  inferior vena cava is normal in size with <50% respiratory  variability, suggesting right atrial pressure of 8 mmHg.   Comparison(s): A prior study was performed on 04/06/2022. AS and MR are no  worse than before.    Lexiscan  Nuclear stress test 05/04/2022: Myocardial perfusion is abnormal. Breast tissue attenuation noted in inferior wall. Cannot exclude mild degree medium extent perfusion defect consistent with ischemia located in the apical inferior wall and mid inferior wall. The LV appears slightly dilated in the stress images, although TID index is insignificant. Overall LV systolic function is normal without regional wall motion abnormalities.  Stress LV EF: 54%.  Non-diagnostic ECG stress. The heart rate response was consistent with Regadenoson.  Compared to previous study 03/24/2019, soft tissue attenuation was previously noted. Possible inferior ischemia is new.  Intermediate risk.    Labs March-April 2024: Glucose 114, BUN/Cr 42/1.51. EGFR 37. Na/K 140/4.6 NT proBNP 577 H/H 9.2/25.7. MCV 89. Platelets 246 No recent lipid panel    Risk Assessment/Calculations:   See below regarding A-fib  Physical Exam:   Physical Exam Vitals and nursing note reviewed.  Constitutional:      General: She is not in acute distress. Neck:     Vascular: No JVD.  Cardiovascular:     Rate and Rhythm: Normal rate and regular rhythm.     Heart sounds: Murmur heard.     Harsh midsystolic murmur is present with a grade of 3/6 at the upper right sternal border radiating to the neck.  Pulmonary:     Effort: Pulmonary effort is normal.     Breath sounds: Normal breath sounds. No wheezing or rales.  Musculoskeletal:     Right lower leg: No edema.     Left lower leg: No edema.      VISIT DIAGNOSES:   ICD-10-CM   1. Nonrheumatic aortic valve stenosis  I35.0 ECHOCARDIOGRAM COMPLETE    atorvastatin (LIPITOR) 20 MG tablet    2. PAF (paroxysmal atrial fibrillation) (HCC)  I48.0     3. Mixed  hyperlipidemia  E78.2        ASSESSMENT AND PLAN: .    Danielle Beck is a 72 y.o. female with hypertension, diabetes mellitus, HFpEF, mod AS, mod MR,  paroxysmal Afib/flutter, PSVT   HFpEF: Chronic, controlled.  Patient is euvolemic today. Continue losartan 50 mg daily, Farxiga 10 mg daily.  Continue torsemide 40 mg in am and 20 mg in pm, with additional 20 mg as needed for leg edema, dyspnea.    AS, MR: Moderate to severe AAS, moderate MR. Possibly paradoxically low-flow low gradient aortic stenosis.  Mild increase in mean gradient from 22-24 mmHg, with subsequent decrease in valve area 0.8 to 0.75 cm..  In spite of that, patient's EF is well-preserved without any significant LVH.  Patient is asymptomatic at this time.  If anything, her heart failure symptoms pronounced over a year ago are significantly improved. I had a long discussion with patient and her husband.  In absence of symptoms, and keeping her renal dysfunction in mind, we decided to hold off further workup for aortic stenosis with TEE and right and left heart catheterization.  I have encouraged the patient to continue monitoring for any symptoms of leg edema or exertional dyspnea.  It is possible that she will likely need aortic valve replacement in her lifetime.  However, we will pursue conservative approach at this time. I will repeat echocardiogram in 6 months. Patient has an upcoming trip to [place] in next few weeks.  I have cautioned her regarding seeking emergency medical care should she develop any symptoms of acute chest pain, shortness of breath, presyncope or syncope.  However, it is reasonable to continue the drip with taking new precautions.   Paroxysmal Afib/flutter + PSVT: Improved. CHA2DS2VASc score 4, annual stroke risk 5%. Continue Xarelto 15 mg daily, given GFR <50. Stable normocytic anemia. Continue Xarelto 15 mg daily.   Hypertension: Controlled  Mixed hyperlipidemia: LDL 90 in a diabetic  patient.  Recommend increasing Lipitor from 10 mg to 20 mg daily. Patient will have labs checked with PCP in 06/2023.  Meds ordered this encounter  Medications   atorvastatin (LIPITOR) 20 MG tablet    Sig: Take 1 tablet (20 mg total) by mouth daily.    Dispense:  90 tablet    Refill:  3    DOSE INCREASE    I spent 15 minutes the day of her visit reviewing her records including previous charts and labs, 20 minutes talking with her about her history and in face-to-face counseling and 10 minutes in adjusting medication for a total of 40 minutes spent in patient care.   F/u in 6 months  Signed, Elder Negus, MD

## 2023-03-16 NOTE — Patient Instructions (Signed)
Medication Instructions:   INCREASE YOUR ATORVASTATIN (LIPITOR) TO 20 MG BY MOUTH DAILY  *If you need a refill on your cardiac medications before your next appointment, please call your pharmacy*    Testing/Procedures:  Your physician has requested that you have an echocardiogram. Echocardiography is a painless test that uses sound waves to create images of your heart. It provides your doctor with information about the size and shape of your heart and how well your heart's chambers and valves are working. This procedure takes approximately one hour. There are no restrictions for this procedure.  SCHEDULE ECHO TO BE DONE IN APRIL 2025   Please do NOT wear cologne, perfume, aftershave, or lotions (deodorant is allowed). Please arrive 15 minutes prior to your appointment time.    Follow-Up:  IN APRIL 2025 AFTER ECHO WITH DR. PATWARDHAN

## 2023-04-02 ENCOUNTER — Other Ambulatory Visit: Payer: Medicare HMO

## 2023-04-06 ENCOUNTER — Ambulatory Visit: Payer: Medicare HMO | Admitting: Cardiology

## 2023-05-03 DIAGNOSIS — E113512 Type 2 diabetes mellitus with proliferative diabetic retinopathy with macular edema, left eye: Secondary | ICD-10-CM | POA: Diagnosis not present

## 2023-05-10 ENCOUNTER — Encounter: Payer: Self-pay | Admitting: Cardiology

## 2023-05-18 ENCOUNTER — Other Ambulatory Visit: Payer: Self-pay | Admitting: Cardiology

## 2023-05-18 DIAGNOSIS — I480 Paroxysmal atrial fibrillation: Secondary | ICD-10-CM

## 2023-05-25 DIAGNOSIS — E113511 Type 2 diabetes mellitus with proliferative diabetic retinopathy with macular edema, right eye: Secondary | ICD-10-CM | POA: Diagnosis not present

## 2023-06-05 ENCOUNTER — Encounter: Payer: Self-pay | Admitting: Cardiology

## 2023-06-05 NOTE — Telephone Encounter (Signed)
 Since blood pressure is tolerating it well, continue torsemide  at current dose twice daily.  If any change in symptoms, please let me know.  Otherwise, we will repeat echocardiogram in the coming few months and follow-up as previously recommended.  Wish you both a happy new year.  Regards, Dr. Elmira

## 2023-06-11 DIAGNOSIS — N1832 Chronic kidney disease, stage 3b: Secondary | ICD-10-CM | POA: Diagnosis not present

## 2023-06-12 ENCOUNTER — Other Ambulatory Visit (HOSPITAL_COMMUNITY): Payer: Self-pay

## 2023-06-12 LAB — LAB REPORT - SCANNED
Calcium: 9.6
Creatinine, POC: 14.5 mg/dL
EGFR: 32
PTH: 38

## 2023-06-21 ENCOUNTER — Other Ambulatory Visit: Payer: Self-pay

## 2023-06-25 ENCOUNTER — Other Ambulatory Visit (HOSPITAL_COMMUNITY): Payer: Self-pay

## 2023-06-25 ENCOUNTER — Other Ambulatory Visit: Payer: Self-pay

## 2023-06-25 MED ORDER — PREGABALIN 75 MG PO CAPS
75.0000 mg | ORAL_CAPSULE | Freq: Two times a day (BID) | ORAL | 0 refills | Status: DC
  Filled 2023-06-25: qty 60, 30d supply, fill #0

## 2023-07-12 ENCOUNTER — Other Ambulatory Visit: Payer: Self-pay

## 2023-07-12 ENCOUNTER — Other Ambulatory Visit (HOSPITAL_COMMUNITY): Payer: Self-pay

## 2023-07-12 MED ORDER — LOSARTAN POTASSIUM 50 MG PO TABS
50.0000 mg | ORAL_TABLET | Freq: Every day | ORAL | 1 refills | Status: DC
  Filled 2023-07-12: qty 100, 100d supply, fill #0
  Filled 2023-10-15: qty 100, 100d supply, fill #1

## 2023-07-12 MED ORDER — LANTUS SOLOSTAR 100 UNIT/ML ~~LOC~~ SOPN
34.0000 [IU] | PEN_INJECTOR | Freq: Every day | SUBCUTANEOUS | 5 refills | Status: DC
  Filled 2023-07-12: qty 9, 26d supply, fill #0
  Filled 2023-08-07: qty 9, 26d supply, fill #1
  Filled 2023-08-30: qty 9, 26d supply, fill #2
  Filled 2023-09-21: qty 9, 26d supply, fill #3
  Filled 2023-10-17: qty 9, 26d supply, fill #4

## 2023-07-12 MED ORDER — METHOCARBAMOL 500 MG PO TABS
1000.0000 mg | ORAL_TABLET | Freq: Every day | ORAL | 11 refills | Status: AC | PRN
  Filled 2023-07-12: qty 60, 30d supply, fill #0
  Filled 2023-09-23: qty 60, 30d supply, fill #1
  Filled 2023-10-30: qty 60, 30d supply, fill #2
  Filled 2023-11-22: qty 60, 30d supply, fill #3
  Filled 2023-12-24: qty 60, 30d supply, fill #4
  Filled 2024-01-22: qty 60, 30d supply, fill #5
  Filled 2024-03-01: qty 60, 30d supply, fill #6
  Filled 2024-06-02: qty 60, 30d supply, fill #7

## 2023-07-12 MED ORDER — ATORVASTATIN CALCIUM 10 MG PO TABS
20.0000 mg | ORAL_TABLET | Freq: Every day | ORAL | 1 refills | Status: DC
  Filled 2023-07-12: qty 200, 100d supply, fill #0

## 2023-07-12 MED ORDER — AMLODIPINE BESYLATE 10 MG PO TABS
10.0000 mg | ORAL_TABLET | Freq: Every day | ORAL | 1 refills | Status: DC
  Filled 2023-07-12: qty 100, 100d supply, fill #0
  Filled 2023-10-15: qty 100, 100d supply, fill #1

## 2023-07-12 MED ORDER — PREGABALIN 75 MG PO CAPS
75.0000 mg | ORAL_CAPSULE | Freq: Two times a day (BID) | ORAL | 5 refills | Status: DC
  Filled 2023-07-27: qty 60, 30d supply, fill #0
  Filled 2023-08-22 – 2023-08-29 (×3): qty 60, 30d supply, fill #1
  Filled 2023-09-23 – 2023-09-26 (×2): qty 60, 30d supply, fill #2
  Filled 2023-10-30: qty 60, 30d supply, fill #3
  Filled 2023-11-22 – 2023-12-04 (×3): qty 60, 30d supply, fill #4
  Filled 2023-12-28 – 2024-01-03 (×2): qty 60, 30d supply, fill #5

## 2023-07-12 MED ORDER — TRAMADOL HCL 50 MG PO TABS
50.0000 mg | ORAL_TABLET | Freq: Four times a day (QID) | ORAL | 5 refills | Status: AC | PRN
  Filled 2023-07-12: qty 10, 3d supply, fill #0

## 2023-07-12 MED ORDER — METFORMIN HCL 1000 MG PO TABS
1000.0000 mg | ORAL_TABLET | Freq: Two times a day (BID) | ORAL | 1 refills | Status: AC
  Filled 2023-07-12: qty 200, 100d supply, fill #0
  Filled 2023-10-15: qty 200, 100d supply, fill #1

## 2023-07-13 ENCOUNTER — Other Ambulatory Visit (HOSPITAL_COMMUNITY): Payer: Self-pay

## 2023-07-16 ENCOUNTER — Other Ambulatory Visit (HOSPITAL_COMMUNITY): Payer: Self-pay

## 2023-07-16 ENCOUNTER — Other Ambulatory Visit: Payer: Self-pay

## 2023-07-16 MED ORDER — LANTUS SOLOSTAR 100 UNIT/ML ~~LOC~~ SOPN: 34.0000 [IU] | PEN_INJECTOR | Freq: Every day | SUBCUTANEOUS | 4 refills | Status: DC

## 2023-07-16 MED ORDER — ALCOHOL PREP 70 % PADS
MEDICATED_PAD | Freq: Every day | 0 refills | Status: AC
  Filled 2023-07-16: qty 100, 100d supply, fill #0

## 2023-07-16 MED ORDER — ONETOUCH ULTRA TEST VI STRP
ORAL_STRIP | Freq: Every day | 3 refills | Status: DC
  Filled 2023-07-16: qty 100, 100d supply, fill #0
  Filled 2023-09-04 – 2023-09-05 (×2): qty 100, 100d supply, fill #1

## 2023-07-16 MED ORDER — DROPLET PEN NEEDLES 32G X 6 MM MISC
Freq: Every day | 0 refills | Status: AC
  Filled 2023-07-16: qty 100, 100d supply, fill #0

## 2023-07-16 MED ORDER — ONETOUCH ULTRA 2 W/DEVICE KIT
PACK | 1 refills | Status: DC
  Filled 2023-07-16: qty 1, 30d supply, fill #0
  Filled 2023-08-11: qty 1, 30d supply, fill #1

## 2023-07-16 MED ORDER — ONETOUCH DELICA PLUS LANCET33G MISC
Freq: Every day | 3 refills | Status: AC
  Filled 2023-07-16: qty 100, 100d supply, fill #0
  Filled 2024-01-10: qty 100, 100d supply, fill #1
  Filled 2024-05-20 – 2024-05-27 (×2): qty 100, 100d supply, fill #2

## 2023-07-17 ENCOUNTER — Other Ambulatory Visit: Payer: Self-pay

## 2023-07-19 ENCOUNTER — Encounter: Payer: Self-pay | Admitting: Cardiology

## 2023-07-19 ENCOUNTER — Other Ambulatory Visit (HOSPITAL_COMMUNITY): Payer: Self-pay

## 2023-07-19 ENCOUNTER — Telehealth: Payer: Self-pay | Admitting: *Deleted

## 2023-07-19 DIAGNOSIS — I480 Paroxysmal atrial fibrillation: Secondary | ICD-10-CM

## 2023-07-19 MED ORDER — TORSEMIDE 20 MG PO TABS
40.0000 mg | ORAL_TABLET | Freq: Two times a day (BID) | ORAL | 0 refills | Status: DC
  Filled 2023-07-19: qty 360, 90d supply, fill #0

## 2023-07-19 MED ORDER — METOPROLOL TARTRATE 25 MG PO TABS
25.0000 mg | ORAL_TABLET | Freq: Two times a day (BID) | ORAL | 3 refills | Status: DC
  Filled 2023-07-19: qty 180, 90d supply, fill #0
  Filled 2023-10-10: qty 180, 90d supply, fill #1
  Filled 2024-01-08: qty 180, 90d supply, fill #2
  Filled 2024-04-07: qty 180, 90d supply, fill #3

## 2023-07-19 NOTE — Telephone Encounter (Signed)
-----   Message from [facility] sent at 07/19/2023  4:56 PM EST ----- Renal function relatively stable. Okay to continue torsemide with increased diose as discussed before.   Thanks MJP ----- Message ----- From: Loa Socks, LPN Sent: 1/61/0960   4:48 PM EST To: Elder Negus, MD  Here is labs you requested ----- Message ----- From: Royann Shivers Sent: 07/19/2023   4:11 PM EST To: Elder Negus, MD; Loa Socks, LPN

## 2023-07-19 NOTE — Telephone Encounter (Signed)
 Pt and wife are aware that Dr. Rosemary Holms reviewed the scanned labs Nephro sent to [place], and okayed for her to take the increased torsemide dose we sent in earlier to her pharmacy, and keep echo appt as scheduled on Monday.  Both verbalized understanding and agrees with this plan.

## 2023-07-19 NOTE — Telephone Encounter (Signed)
 Okay with me, in addition to the following. Let us check BMP and proBNP, if not checked elsewhere (PCP or nephrology) within last 3 months. Let us move up echocardiogram currently scheduled for April, to sometime in the next 2-3 weeks, and please schedule a follow up visit after the echocardiogram, with me.  Thanks MJP

## 2023-07-19 NOTE — Telephone Encounter (Signed)
 Spoke with the pt and wife.  Both agreed to have Nephro and PCP fax [place] most recent labs to [phone] and we will have our echo scheduler move her current appt for the echo in April to have this done in the next 2-3 weeks.   The husband requested that we keep her current follow-up appt with Dr. Rosemary Holms as scheduled for April and only move up the echo appt.  Spouse said if the echo suggest new or critical findings, then move her follow-up appt with Dr. Rosemary Holms up.  Shared this plan with Dr. Rosemary Holms, and he agreed to this.

## 2023-07-19 NOTE — Telephone Encounter (Signed)
 Pt is now scheduled to have her echo done next Monday 07/23/23 at 0930.  Pt made aware of appt date and time by Echo Scheduler.

## 2023-07-23 ENCOUNTER — Ambulatory Visit (HOSPITAL_COMMUNITY): Payer: PPO | Attending: Cardiology

## 2023-07-23 DIAGNOSIS — I350 Nonrheumatic aortic (valve) stenosis: Secondary | ICD-10-CM | POA: Diagnosis not present

## 2023-07-23 LAB — ECHOCARDIOGRAM COMPLETE
AR max vel: 0.7 cm2
AV Area VTI: 0.56 cm2
AV Area mean vel: 0.6 cm2
AV Mean grad: 31 mmHg
AV Peak grad: 49.3 mmHg
Ao pk vel: 3.51 m/s
Area-P 1/2: 5.79 cm2
MV M vel: 5.37 m/s
MV Peak grad: 115.3 mmHg
P 1/2 time: 401 ms
Radius: 0.8 cm
S' Lateral: 3.3 cm

## 2023-07-24 ENCOUNTER — Encounter: Payer: Self-pay | Admitting: Cardiology

## 2023-07-25 ENCOUNTER — Ambulatory Visit: Payer: PPO | Attending: Cardiology | Admitting: Cardiology

## 2023-07-25 ENCOUNTER — Encounter: Payer: Self-pay | Admitting: Cardiology

## 2023-07-25 VITALS — BP 118/60 | HR 78 | Ht 63.0 in | Wt 190.0 lb

## 2023-07-25 DIAGNOSIS — I350 Nonrheumatic aortic (valve) stenosis: Secondary | ICD-10-CM | POA: Diagnosis not present

## 2023-07-25 DIAGNOSIS — I5033 Acute on chronic diastolic (congestive) heart failure: Secondary | ICD-10-CM | POA: Diagnosis not present

## 2023-07-25 DIAGNOSIS — I480 Paroxysmal atrial fibrillation: Secondary | ICD-10-CM | POA: Diagnosis not present

## 2023-07-25 DIAGNOSIS — Z01812 Encounter for preprocedural laboratory examination: Secondary | ICD-10-CM

## 2023-07-25 DIAGNOSIS — I5032 Chronic diastolic (congestive) heart failure: Secondary | ICD-10-CM | POA: Diagnosis not present

## 2023-07-25 LAB — CBC WITH DIFFERENTIAL/PLATELET
Basophils Absolute: 0 10*3/uL (ref 0.0–0.2)
Basos: 1 %
EOS (ABSOLUTE): 0.3 10*3/uL (ref 0.0–0.4)
Eos: 3 %
Hematocrit: 30.8 % — ABNORMAL LOW (ref 34.0–46.6)
Hemoglobin: 10.1 g/dL — ABNORMAL LOW (ref 11.1–15.9)
Immature Grans (Abs): 0.1 10*3/uL (ref 0.0–0.1)
Immature Granulocytes: 1 %
Lymphocytes Absolute: 2 10*3/uL (ref 0.7–3.1)
Lymphs: 23 %
MCH: 31.3 pg (ref 26.6–33.0)
MCHC: 32.8 g/dL (ref 31.5–35.7)
MCV: 95 fL (ref 79–97)
Monocytes Absolute: 0.6 10*3/uL (ref 0.1–0.9)
Monocytes: 7 %
Neutrophils Absolute: 5.8 10*3/uL (ref 1.4–7.0)
Neutrophils: 65 %
Platelets: 270 10*3/uL (ref 150–450)
RBC: 3.23 x10E6/uL — ABNORMAL LOW (ref 3.77–5.28)
RDW: 12.3 % (ref 11.7–15.4)
WBC: 8.6 10*3/uL (ref 3.4–10.8)

## 2023-07-25 NOTE — Patient Instructions (Addendum)
 Medication Instructions:   STOP TAKING FARXIGA NOW  *If you need a refill on your cardiac medications before your next appointment, please call your pharmacy*   You have been referred to OUR [facility] --YOU WILL BE RECEIVING A CALL BACK SOON TO ARRANGE THIS CONSULT APPOINTMENT WITH ONE OF OUR STRUCTURAL DOCTORS--STRUCTURAL RN NAVIGATOR WILL CALL YOU TO ARRANGE (Karsten Fells RN/Lauren [employer] RN     Lab Work:  TODAY DOWNSTAIRS FIRST FLOOR AT [facility]--BMET, CBC W DIFF, AND PRO-BNP  If you have labs (blood work) drawn today and your tests are completely normal, you will receive your results only by: [employer] (if you have MyChart) OR A paper copy in the mail If you have any lab test that is abnormal or we need to change your treatment, we will call you to review the results.   Testing/Procedures:        Cardiac/Peripheral Catheterization   You are scheduled for a Cardiac Catheterization on Friday, March 7 with Dr.  Rosemary Holms .  1. Please arrive at the [facility] (Main Entrance A) at [place]: [address] [place], [place] [address] at 5:30 AM (This time is 2 hour(s) before your procedure to ensure your preparation).   Free valet parking service is available. You will check in at ADMITTING. The support person will be asked to wait in the waiting room.  It is OK to have someone drop you off and come back when you are ready to be discharged.        Special note: Every effort is made to have your procedure done on time. Please understand that emergencies sometimes delay scheduled procedures.  2. Diet: Do not eat solid foods after midnight.  You may have clear liquids until 5 AM the day of the procedure.  3. Labs: You will need to have blood drawn on Wednesday, February 26 at [facility]. [address], [place]  Open: 7:30am - 5pm    Phone: [phone]. You do not need to be  fasting.  4. Medication instructions in preparation for your procedure:   Contrast Allergy: No    Stop taking Xarelto (Rivaroxaban) on Wednesday, March 5.  HOLD TORSEMIDE THE MORNING OF THE CATH (ON 08/03/23)  Take only 17 units of insulin the night before your procedure. Do not take any insulin on the day of the procedure.  Do not take Diabetes Med Glucophage (Metformin) on the day of the procedure and HOLD 48 HOURS AFTER THE PROCEDURE.  On the morning of your procedure, take Aspirin 81 mg and any morning medicines NOT listed above.  You may use sips of water.  5. Plan to go home the same day, you will only stay overnight if medically necessary. 6. You MUST have a responsible adult to drive you home. 7. An adult MUST be with you the first 24 hours after you arrive home. 8. Bring a current list of your medications, and the last time and date medication taken. 9. Bring ID and current insurance cards. 10.Please wear clothes that are easy to get on and off and wear slip-on shoes.  Thank you for allowing [place] to care for you!   -- [facility]    Follow-Up:  END OF APRIL/BEGINNING OF MAY WITH DR. PATWARDHAN

## 2023-07-25 NOTE — Progress Notes (Signed)
 Cardiology Office Note:  .   Date:  07/25/2023  ID:  ABY GESSEL, DOB 06-Jan-1951, MRN [id number] PCP: Blair Heys, MD (Inactive)   [facility] Providers Cardiologist:  Truett Mainland, MD PCP: Blair Heys, MD (Inactive)  Chief Complaint  Patient presents with   Aortic Stenosis      History of Present Illness: .    Danielle Beck is a 73 y.o. female with hypertension, diabetes mellitus, HFpEF, paroxysmal Afib/flutter, PSVT, mod AS, mod MR, HFpEF.   Patient is here today with her husband. In the last few weeks, she has noticed worsening exertional chest pain and shortness of breath.  She also notices orthopnea, and has been sleeping in a fairly upright position.  She also has noticed increased leg swelling, which improved after increasing torsemide to 20 mg twice daily.  Since then, she has dropped down to 40 mg in a.m. and 20 mg in PM.  Patient recently saw her primary care as well as nephrologist.  There was noted to be drop in GFR from 32 to to 26.  Reviewed recent echocardiogram 07/23/2023, discussed with the patient, details below.  Vitals:   07/25/23 1414  BP: 118/60  Pulse: 78      ROS:  Review of Systems  Cardiovascular:  Positive for chest pain, dyspnea on exertion and leg swelling. Negative for palpitations and syncope.     Studies Reviewed: Marland Kitchen       EKG 07/27/2023: Normal sinus rhythm 78 bpm Left axis deviation Left ventricular hypertrophy with QRS widening and repolarization abnormality ( R in aVL , Cornell product ) Cannot rule out Septal infarct (cited on or before 25-Jul-2023) When compared with ECG of 21-Aug-2022 20:47, No significant change was found   Independently interpreted 06/2023: eGFR 26 Hb 10  05/2023: Chol 172, TG 142, HDL 58, LDL 90 HbA1C 6.3% Cr 1.67, eGFR 32, K 5.0  External labs 12/2022: Creatinine 1.67, EGFR 33. LDL 90  EKG 08/31/2022: Sinus rhythm 79 bpm LAFB LVH Possible old anteroseptal  infarct  Independently interpreted  Echocardiogram 07/24/2023: Mild LVH.  LVEF 55 to 60%.  Grade 2 diastolic dysfunction.  GLS -18%. Normal RV systolic function.  Estimated RVSP 35 [name]. Mild to moderate mitral regurgitation. Severely calcified aortic valve.  Severe arctic stenosis.  V-max 3.5 m/s, mean gradient 31 mmHg, AVA 0.6 cm by continued equation, dimensionless index 0.19.  Low stroke-volume index 28 cc/m.  Findings consistent with paradoxical low-flow low gradient severe aortic stenosis. In comparison, previous echocardiogram in 02/2019 for reported mean pressure gradient 25 mmHg, AVA 0.75 cm,   1. Left ventricular ejection fraction, by estimation, is 55 to 60%. Left  ventricular ejection fraction by 3D volume is 56 %. The left ventricle has  normal function. The left ventricle has no regional wall motion  abnormalities. There is mild left  ventricular hypertrophy. Left ventricular diastolic parameters are  consistent with Grade II diastolic dysfunction (pseudonormalization).  Elevated left atrial pressure. The average left ventricular global  longitudinal strain is -18.0 %. The global  longitudinal strain is normal.   2. Right ventricular systolic function is normal. The right ventricular  size is normal. There is normal pulmonary artery systolic pressure. The  estimated right ventricular systolic pressure is 34.6 mmHg.   3. The mitral valve is degenerative. Mild to moderate mitral valve  regurgitation.   4. The inferior vena cava is normal in size with greater than 50%  respiratory variability, suggesting right atrial pressure of 3 mmHg.  5. The aortic valve is calcified. There is severe calcifcation of the  aortic valve. Aortic valve regurgitation is trivial. Severe aortic valve  stenosis. Moderate AS by gradients (Vmax 3.5 m/s, MG ), severe by  AVA (0.6 cm^2) and DI (0.19). Low SV  index (28 cc/m^2). Consistent with paradoxical low flow low gradient  severe AS     Echocardiogram 08/21/2022: 1. Left ventricular ejection fraction, by estimation, is 55 to 60%. The  left ventricle has normal function. The left ventricle has no regional  wall motion abnormalities. Left ventricular diastolic parameters are  indeterminate.   2. Right ventricular systolic function is normal. The right ventricular  size is normal.   3. The aortic valve is calcified. Moderate aortic valve stenosis. Aortic  valve area, by VTI measures 1.05 cm. Aortic valve mean gradient measures  18.0 mmHg. Aortic valve Vmax measures 2.71 m/s.   4. The inferior vena cava is normal in size with <50% respiratory  variability, suggesting right atrial pressure of 8 mmHg.   Comparison(s): A prior study was performed on 04/06/2022. AS and MR are no  worse than before.    Lexiscan  Nuclear stress test 05/04/2022: Myocardial perfusion is abnormal. Breast tissue attenuation noted in inferior wall. Cannot exclude mild degree medium extent perfusion defect consistent with ischemia located in the apical inferior wall and mid inferior wall. The LV appears slightly dilated in the stress images, although TID index is insignificant. Overall LV systolic function is normal without regional wall motion abnormalities.  Stress LV EF: 54%.  Non-diagnostic ECG stress. The heart rate response was consistent with Regadenoson.  Compared to previous study 03/24/2019, soft tissue attenuation was previously noted. Possible inferior ischemia is new.  Intermediate risk.    Labs March-April 2024: Glucose 114, BUN/Cr 42/1.51. EGFR 37. Na/K 140/4.6 NT proBNP 577 H/H 9.2/25.7. MCV 89. Platelets 246 No recent lipid panel    Risk Assessment/Calculations:   CHA2DS2-VASc Score is  5, annual stroke risk 7.2% -(CHF; HTN; vasc disease DM,  Female = 1; Age <65 =0; 65-74 = 1,  >75 =2; stroke = 2).  -(Yearly risk of stroke: Score of 1=1.3; 2=2.2; 3=3.2; 4=4; 5=6.7; 6=9.8; 7=>9.8)   Physical Exam:   Physical Exam Vitals and  nursing note reviewed.  Constitutional:      General: She is not in acute distress. Neck:     Vascular: No JVD.  Cardiovascular:     Rate and Rhythm: Normal rate and regular rhythm.     Heart sounds: Murmur heard.     Harsh midsystolic murmur is present with a grade of 3/6 at the upper right sternal border radiating to the neck.  Pulmonary:     Effort: Pulmonary effort is normal.     Breath sounds: Normal breath sounds. No wheezing or rales.  Musculoskeletal:     Right lower leg: Edema (1+) present.     Left lower leg: Edema (1+) present.      VISIT DIAGNOSES:   ICD-10-CM   1. Chronic heart failure with preserved ejection fraction (HCC)  I50.32 EKG 12-Lead    Basic metabolic panel    CBC w/Diff    Pro b natriuretic peptide (BNP)    Pro b natriuretic peptide (BNP)    CBC w/Diff    Basic metabolic panel    2. Nonrheumatic aortic valve stenosis  I35.0 Basic metabolic panel    CBC w/Diff    Pro b natriuretic peptide (BNP)    Ambulatory referral to [facility] (  only at [facility])    Pro b natriuretic peptide (BNP)    CBC w/Diff    Basic metabolic panel    3. Pre-procedure lab exam  [phone] Basic metabolic panel    CBC w/Diff    Pro b natriuretic peptide (BNP)    Pro b natriuretic peptide (BNP)    CBC w/Diff    Basic metabolic panel    4. Severe aortic stenosis  I35.0 Basic metabolic panel    CBC w/Diff    Pro b natriuretic peptide (BNP)    Ambulatory referral to [facility] (only at [facility])    Pro b natriuretic peptide (BNP)    CBC w/Diff    Basic metabolic panel    5. PAF (paroxysmal atrial fibrillation) (HCC)  I48.0         ASSESSMENT AND PLAN: .    Danielle Beck is a 73 y.o. female with hypertension, diabetes mellitus, HFpEF, mod AS, mod MR,  paroxysmal Afib/flutter, PSVT   HFpEF: Acute on chronic HFpEF with recent worsening symptoms and leg edema. I reckon this is brought upon by worsening of her aortic  stenosis. Given her tenuous renal function, I have continued her torsemide at 40 mg in a.m., 20 mg in p.m. Okay to continue losartan 50 mg daily, but I have stopped her Farxiga 10 mg daily given worsening renal function and concern for hypertension in the setting of severe aortic stenosis.  This should also help with eventual anesthesia for TAVR, as Marcelline Deist can slow down gastric emptying. If there is increasing swelling after stopping Marcelline Deist, an additional dose of torsemide 20 mg can be added in the afternoon.  Severe aortic stenosis: Progressive worsening of aortic stenosis gradients and valve area, suggestive of paradoxically low-flow low gradient severe aortic stenosis. At this stage, I recommend proceeding with workup for potential TAVR. Obviously, she will need right and left heart catheterization and gated CT angiogram prior to that.  There has been slight drop in her renal function lately with EGFR dropping from 32 to 26.  I discussed potential risks of contrast-induced nephropathy and requiring at least temporary dialysis with the patient and her husband at length.  I will also reach out to patient's nephrologist Dr. Ronalee Belts to get his input regarding any additional recommendations to reduce the risk of contrast-induced nephropathy. With that in mind, I would probably opt for femoral access for her right and left heart catheterization to reduce contrast dye, as well as preserve her radial arteries for future use for potential AV fistula.  That said, if this is anatomically limiting given her obesity, we may have to use right radial artery access for that reason.   Paroxysmal Afib/flutter + PSVT: Currently in sinus rhythm.   CHA2DS2VASc score 4, annual stroke risk 5%. Continue Xarelto 15 mg daily, given GFR <50. Stable normocytic anemia. Continue Xarelto 15 mg daily.   Hypertension: Controlled  Mixed hyperlipidemia: LDL 90, slightly suboptimal. Given her worsening renal function, I  will not make changes to her statin. In future, we could consider additional injectable therapy for her hyperlipidemia.  No orders of the defined types were placed in this encounter.   I spent 50 minutes in the care of SABRINA KEOUGH today including reviewing labs (CBC, BMP from 05/2023, and 06/2023), reviewing studies (echocardiogram 07/20/2023, and comparing and personally reviewing prior echocardiogram from 02/2023), face to face time discussing treatment options (C aortic stenosis in the setting of CKD stage IIIb/IV), and documenting in the  encounter. ,  As well as care coordination.   F/u in 6 months  Signed, Elder Negus, MD

## 2023-07-26 LAB — BASIC METABOLIC PANEL
BUN/Creatinine Ratio: 23 (ref 12–28)
BUN: 42 mg/dL — ABNORMAL HIGH (ref 8–27)
CO2: 21 mmol/L (ref 20–29)
Calcium: 9.6 mg/dL (ref 8.7–10.3)
Chloride: 103 mmol/L (ref 96–106)
Creatinine, Ser: 1.81 mg/dL — ABNORMAL HIGH (ref 0.57–1.00)
Glucose: 198 mg/dL — ABNORMAL HIGH (ref 70–99)
Potassium: 4.4 mmol/L (ref 3.5–5.2)
Sodium: 143 mmol/L (ref 134–144)
eGFR: 30 mL/min/{1.73_m2} — ABNORMAL LOW (ref >59)

## 2023-07-26 LAB — PRO B NATRIURETIC PEPTIDE: NT-Pro BNP: 766 pg/mL — ABNORMAL HIGH (ref 0–301)

## 2023-07-26 NOTE — Progress Notes (Signed)
 Renal function looks relatively stable at GFR of 30.  Patient is going to undergo left and right heart catheterization, as well as gated CTA imaging in preparation for possible TAVR aortic valve replacement in the near future.  Please let me know if you have any recommendations regarding renal function preservation.  Generally, I would hydrate patient's at this GFR aggressively for heart catheterization.  However, given her severe aortic stenosis, and HFpEF, I would probably avoid hydration until I have measurement of her wedge pressure, and then decide on either IV fluids or Lasix.  Please let me know if you have any thoughts.  Thanks [employer]

## 2023-07-27 ENCOUNTER — Other Ambulatory Visit: Payer: Self-pay

## 2023-07-28 ENCOUNTER — Other Ambulatory Visit (HOSPITAL_COMMUNITY): Payer: Self-pay

## 2023-07-30 NOTE — Progress Notes (Signed)
 Patient ID: Danielle Beck MRN: [id number] DOB/AGE: 72/10/1950 72 y.o.  Primary Care Physician:Sparks, Marcello Moores, DO Primary Cardiologist: Rosemary Holms  CC:  Aortic valvular disease management     FOCUSED PROBLEM LIST:   Aortic stenosis PLFLG AS AVA 0.6, DI 0.19, SVI 28, MG 31, V-max 3.5, EF 50 to 55% TTE February 2025 EKG February 2025 sinus rhythm without bundle-branch blocks Coronary artery disease Mild, cardiac catheterization 2025 Diastolic dysfunction G2 DD EF 50 to 55% TTE February 2025 T2DM On insulin Hypertension Hyperlipidemia Aortic atherosclerosis Chest x-ray 2024 PAF CV 2 score 4 On Xarelto CKD stage IIIb BMI 33/BSA 1.96 Spinal stenosis  March 2025:  Patient consents to use of AI scribe.  The patient is a 73 year old female with the above listed medical problems here for recommendations regarding her aortic stenosis.  She was seen by her primary cardiologist recently with complaints of exertional chest pain and shortness of breath with increased leg swelling.  An echocardiogram demonstrated the development of paradoxical low-flow low gradient aortic stenosis.  As part of her evaluation she is referred for coronary angiography and right heart catheterization which demonstrated mild nonobstructive disease with a preserved cardiac output and index with a wedge pressure of 24 mmHg.  She experiences shortness of breath, particularly when bending down or walking, which started last year. Dizziness occurs when moving from sitting to standing quickly, and she has difficulty lying flat in bed due to shortness of breath, ongoing for two to three years. She uses two pillows to sleep comfortably. Echocardiograms have shown severe stenosis of the aortic valve.  She has chronic heart failure, managed with diuretics. Her current regimen includes torsemide, increased after a hospital admission for pulmonary edema a year ago. She takes 40 mg in the morning and 20 mg in the evening,  with adjustments as needed based on swelling and weight monitoring.  She has a history of atrial fibrillation, currently managed with Xarelto, with no further episodes reported.  She has diabetes, managed with insulin therapy.  She reports leg swelling present for at least a year, which fluctuates. Her weight has remained stable at approximately 183 pounds. She also has a history of spinal stenosis, contributing to her lower leg symptoms and limiting her ability to walk long distances without taking breaks.  In terms of social history, she spends most of her time at home, engaging in activities such as cooking and cleaning, although she needs to sit while preparing meals due to fatigue and leg pain. She does not go grocery shopping and limits her physical activity due to her symptoms  She denies any dental complaints.         Past Medical History:  Diagnosis Date   CHF (congestive heart failure) (HCC)    Diabetes mellitus without complication (HCC)    Hyperlipidemia    Hypertension     Past Surgical History:  Procedure Laterality Date   CATARACT EXTRACTION     RIGHT/LEFT HEART CATH AND CORONARY ANGIOGRAPHY N/A 08/03/2023   Procedure: RIGHT/LEFT HEART CATH AND CORONARY ANGIOGRAPHY;  Surgeon: Elder Negus, MD;  Location: [facility];  Service: Cardiovascular;  Laterality: N/A;   TUBAL LIGATION  1985    Family History  Problem Relation Age of Onset   Diabetes Mother    Heart failure Father    Diabetes Brother    Diabetes Brother    Diabetes Brother    Hypertension Brother    Breast cancer Neg Hx     Social History  Socioeconomic History   Marital status: Married    Spouse name: Not on file   Number of children: 3   Years of education: Not on file   Highest education level: Not on file  Occupational History   Not on file  Tobacco Use   Smoking status: Never   Smokeless tobacco: Never  Vaping Use   Vaping status: Never Used  Substance and Sexual  Activity   Alcohol use: Not Currently   Drug use: Not Currently   Sexual activity: Not on file  Other Topics Concern   Not on file  Social History Narrative   Not on file   Social Drivers of Health   Financial Resource Strain: Not on file  Food Insecurity: Not on file  Transportation Needs: Not on file  Physical Activity: Not on file  Stress: Not on file  Social Connections: Not on file  Intimate Partner Violence: Not on file     Prior to Admission medications   Medication Sig Start Date End Date Taking? Authorizing Provider  Alcohol Swabs (ALCOHOL PREP) 70 % PADS Use as directed aily 07/16/23     amLODipine (NORVASC) 10 MG tablet Take 1 tablet (10 mg total) by mouth daily. 07/12/23     atorvastatin (LIPITOR) 10 MG tablet Take 2 tablets (20 mg total) by mouth daily for cholesterol. 07/12/23     Blood Glucose Monitoring Suppl (ONE TOUCH ULTRA 2) w/Device KIT Use to check blood sugar as directed 07/16/23     Calcium Carb-Cholecalciferol (CALCIUM 600 + D PO) Take 1 tablet by mouth 2 (two) times daily.    [provider]  Cholecalciferol (VITAMIN D3) 1000 units CAPS Take 1,000 Units by mouth daily.    [provider]  cyanocobalamin (VITAMIN B12) 1000 MCG tablet Take 1,000 mcg by mouth daily.    [provider]  dorzolamide (TRUSOPT) 2 % ophthalmic solution Place 1 drop into the left eye 2 (two) times daily. 12/25/18   [provider]  Ferrous Sulfate (IRON) 325 (65 Fe) MG TABS Take 325 mg by mouth 2 (two) times daily.    [provider]  glucose blood (ONETOUCH ULTRA TEST) test strip Use as directed to check blood sugar daily. 07/16/23     insulin glargine (LANTUS SOLOSTAR) 100 UNIT/ML Solostar Pen Inject 34 Units into the skin daily. 07/12/23     Insulin Pen Needle (DROPLET PEN NEEDLES) 32G X 6 MM MISC Use to inject insulin once daily. 07/16/23     Lancets (ONETOUCH DELICA PLUS LANCET33G) MISC Use to check blood sugar daily. 07/16/23      latanoprost (XALATAN) 0.005 % ophthalmic solution Place 1 drop into both eyes at bedtime.  12/25/18   [provider]  losartan (COZAAR) 50 MG tablet Take 1 tablet (50 mg total) by mouth daily for blood pressure. 07/12/23     metFORMIN (GLUCOPHAGE) 1000 MG tablet Take 1 tablet (1,000 mg total) by mouth 2 (two) times daily with a meal. 07/12/23     methocarbamol (ROBAXIN) 500 MG tablet Take 2 tablets (1,000 mg total) by mouth daily as needed for muscle spasms. Patient taking differently: Take 1,000 mg by mouth daily. 07/12/23     metoprolol tartrate (LOPRESSOR) 25 MG tablet Take 1 tablet (25 mg total) by mouth 2 (two) times daily. 07/19/23   Patwardhan, Anabel Bene, MD  nitroGLYCERIN (NITROSTAT) 0.4 MG SL tablet Place 1 tablet (0.4 mg total) under the tongue every 5 (five) minutes as needed for chest pain. 04/17/22  07/30/23  Patwardhan, Anabel Bene, MD  pregabalin (LYRICA) 75 MG capsule Take 1 capsule (75 mg total) by mouth 2 (two) times daily. Patient taking differently: Take 150 mg by mouth daily. 07/12/23     pyridOXINE (VITAMIN B-6) 100 MG tablet Take 100 mg by mouth daily.    [provider]  Thiamine HCl (VITAMIN B-1) 250 MG tablet Take 250 mg by mouth daily.    [provider]  timolol (TIMOPTIC) 0.5 % ophthalmic solution Place 1 drop into the left eye 2 (two) times daily. 12/26/18   [provider]  torsemide (DEMADEX) 20 MG tablet Take 2 tablets (40 mg total) by mouth 2 (two) times daily. Patient taking differently: Take 20-40 mg by mouth See admin instructions. Take 40 mg in the morning and 20-40 mg in the afternoon 07/19/23   Patwardhan, Anabel Bene, MD  traMADol (ULTRAM) 50 MG tablet Take 1 tablet (50 mg total) by mouth every 6 (six) hours as needed for moderate pain 07/12/23     XARELTO 15 MG TABS tablet TAKE 1 TABLET EVERY DAY WITH SUPPER 10/27/22   Patwardhan, Manish J, MD    No Known Allergies  REVIEW OF SYSTEMS:  General: no fevers/chills/night sweats Eyes: no  blurry vision, diplopia, or amaurosis ENT: no sore throat or hearing loss Resp: no cough, wheezing, or hemoptysis CV: no edema or palpitations GI: no abdominal pain, nausea, vomiting, diarrhea, or constipation GU: no dysuria, frequency, or hematuria Skin: no rash Neuro: no headache, numbness, tingling, or weakness of extremities Musculoskeletal: no joint pain or swelling Heme: no bleeding, DVT, or easy bruising Endo: no polydipsia or polyuria  BP (!) 128/54   Pulse 75   Ht 5' 1.5" (1.562 m)   Wt 185 lb (83.9 kg)   SpO2 97%   BMI 34.39 kg/m   PHYSICAL EXAM: GEN:  AO x 3 in no acute distress HEENT: normal Dentition: Normal Neck: JVP normal. +2 carotid upstrokes without bruits. No thyromegaly. Lungs: equal expansion, clear bilaterally CV: Apex is discrete and nondisplaced, RRR with 3/6 SEM Abd: soft, non-tender, non-distended; no bruit; positive bowel sounds Ext: no edema, ecchymoses, or cyanosis Vascular: 2+ femoral pulses, 2+ radial pulses       Skin: warm and dry without rash Neuro: CN II-XII grossly intact; motor and sensory grossly intact    DATA AND STUDIES:  EKG:  EKG Interpretation Date/Time:    Ventricular Rate:    PR Interval:    QRS Duration:    QT Interval:    QTC Calculation:   R Axis:      Text Interpretation:          Cardiac Studies & Procedures   ______________________________________________________________________________________________ CARDIAC CATHETERIZATION  CARDIAC CATHETERIZATION 08/03/2023  Narrative Images from the original result were not included. Coronary angiography 08/03/2023: LM: Normal LAD: Mild calcification Prox-mid 30% disease Lcx: Large vessel 50% mid stenosis between ectatic segment on either side RCA: Mild calcification with mild diffuse disease    Right heart catheterization 08/03/2023: RA: 9 mmHg RV: 44/4 mmHg PA: 42/20 mmHg, mPAP 36 mmHg PCW: 24 mmHg  AO sats: 92% PA sats: 57%  CO: 5.0 L/min CI: 2.7  L/min/m2  Two vessel nonobstructive CAD No proximal CAD Moderate PH, WHO Grp II Mildly elevated filling pressures  Patient received 800 cc fluid pre cath. Will stop additional fluids given mildly elevated PCWP, and give IV lasix 20 mg. Okay to resume Xarelto on 3/8 AM  Manish Emiliano Dyer, MD  Findings Coronary Findings  Diagnostic  Dominance: Right  Left Anterior Descending The vessel is mildly calcified. Prox LAD to Mid LAD lesion is 30% stenosed.  Second Diagonal Branch 2nd Diag lesion is 90% stenosed.  Left Circumflex Mid Cx lesion is 50% stenosed. 50% stenosis between ectatic segment on either side  Right Coronary Artery There is mild diffuse disease throughout the vessel. The vessel is mildly calcified.  Intervention  No interventions have been documented.   STRESS TESTS  PCV MYOCARDIAL PERFUSION WITH LEXISCAN 05/04/2022  Narrative Lexiscan  Nuclear stress test 05/04/2022: Myocardial perfusion is abnormal. Breast tissue attenuation noted in inferior wall. Cannot exclude mild degree medium extent perfusion defect consistent with ischemia located in the apical inferior wall and mid inferior wall. The LV appears slightly dilated in the stress images, although TID index is insignificant. Overall LV systolic function is normal without regional wall motion abnormalities.  Stress LV EF: 54%. Non-diagnostic ECG stress. The heart rate response was consistent with Regadenoson. Compared to previous study 03/24/2019, soft tissue attenuation was previously noted. Possible inferior ischemia is new.  Intermediate risk.   ECHOCARDIOGRAM  ECHOCARDIOGRAM COMPLETE 07/23/2023  Narrative ECHOCARDIOGRAM REPORT    Patient Name:   Danielle Beck Date of Exam: 07/23/2023 Medical Rec #:  [id number]          Height:       63.0 in Accession #:    [id number]         Weight:       184.0 lb Date of Birth:  03-10-1951          BSA:          1.866 m Patient Age:    72 years            BP:           123/68 mmHg Patient Gender: F                  HR:           74 bpm. Exam Location:  [address]  Procedure: 2D Echo, 3D Echo, Cardiac Doppler, Color Doppler and Strain Analysis (Both Spectral and Color Flow Doppler were utilized during procedure).  Indications:    I35.0 Aortic Stenosis  History:        Patient has prior history of Echocardiogram examinations, most recent 03/12/2023. Mitral Valve Disease, Arrythmias:Atrial Fibrillation and PSVT; Risk Factors:Hypertension and Diabetes.  Sonographer:    Clearence Ped RCS Referring Phys: [id number] [facility] J PATWARDHAN  IMPRESSIONS   1. Left ventricular ejection fraction, by estimation, is 55 to 60%. Left ventricular ejection fraction by 3D volume is 56 %. The left ventricle has normal function. The left ventricle has no regional wall motion abnormalities. There is mild left ventricular hypertrophy. Left ventricular diastolic parameters are consistent with Grade II diastolic dysfunction (pseudonormalization). Elevated left atrial pressure. The average left ventricular global longitudinal strain is -18.0 %. The global longitudinal strain is normal. 2. Right ventricular systolic function is normal. The right ventricular size is normal. There is normal pulmonary artery systolic pressure. The estimated right ventricular systolic pressure is 34.6 mmHg. 3. The mitral valve is degenerative. Mild to moderate mitral valve regurgitation. 4. The inferior vena cava is normal in size with greater than 50% respiratory variability, suggesting right atrial pressure of 3 mmHg. 5. The aortic valve is calcified. There is severe calcifcation of the aortic valve. Aortic valve regurgitation is trivial. Severe aortic valve stenosis. Moderate AS by gradients (Vmax 3.5 m/s, MG ), severe  by AVA (0.6 cm^2) and DI (0.19). Low SV index (28 cc/m^2). Consistent with paradoxical low flow low gradient severe AS  FINDINGS Left Ventricle: Left ventricular  ejection fraction, by estimation, is 55 to 60%. Left ventricular ejection fraction by 3D volume is 56 %. The left ventricle has normal function. The left ventricle has no regional wall motion abnormalities. The average left ventricular global longitudinal strain is -18.0 %. Strain was performed and the global longitudinal strain is normal. The left ventricular internal cavity size was normal in size. There is mild left ventricular hypertrophy. Left ventricular diastolic parameters are consistent with Grade II diastolic dysfunction (pseudonormalization). Elevated left atrial pressure.  Right Ventricle: The right ventricular size is normal. No increase in right ventricular wall thickness. Right ventricular systolic function is normal. There is normal pulmonary artery systolic pressure. The tricuspid regurgitant velocity is 2.81 m/s, and with an assumed right atrial pressure of 3 mmHg, the estimated right ventricular systolic pressure is 34.6 mmHg.  Left Atrium: Left atrial size was normal in size.  Right Atrium: Right atrial size was normal in size.  Pericardium: There is no evidence of pericardial effusion.  Mitral Valve: The mitral valve is degenerative in appearance. Mild to moderate mitral valve regurgitation.  Tricuspid Valve: The tricuspid valve is normal in structure. Tricuspid valve regurgitation is trivial.  Aortic Valve: The aortic valve is calcified. There is severe calcifcation of the aortic valve. Aortic valve regurgitation is trivial. Aortic regurgitation PHT measures 401 msec. Severe aortic stenosis is present. Aortic valve mean gradient measures 31.0 mmHg. Aortic valve peak gradient measures 49.3 mmHg. Aortic valve area, by VTI measures 0.56 cm.  Pulmonic Valve: The pulmonic valve was grossly normal. Pulmonic valve regurgitation is trivial.  Aorta: The aortic root and ascending aorta are structurally normal, with no evidence of dilitation.  Venous: The inferior vena cava is  normal in size with greater than 50% respiratory variability, suggesting right atrial pressure of 3 mmHg.  IAS/Shunts: The interatrial septum was not well visualized.  Additional Comments: 3D was performed not requiring image post processing on an independent workstation and was normal.   LEFT VENTRICLE PLAX 2D LVIDd:         4.80 cm         Diastology LVIDs:         3.30 cm         LV e' medial:    5.98 cm/s LV PW:         1.20 cm         LV E/e' medial:  20.7 LV IVS:        1.00 cm         LV e' lateral:   11.90 cm/s LVOT diam:     1.90 cm         LV E/e' lateral: 10.4 LV SV:         52 LV SV Index:   28              2D LVOT Area:     2.84 cm        Longitudinal Strain 2D Strain GLS  -18.0 % Avg:  3D Volume EF LV 3D EF:    Left ventricul ar ejection fraction by 3D volume is 56 %.  3D Volume EF: 3D EF:        56 % LV EDV:       105 ml LV ESV:       46 ml LV SV:  59 ml  RIGHT VENTRICLE RV Basal diam:  2.70 cm RV S prime:     11.30 cm/s TAPSE (M-mode): 2.2 cm RVSP:           34.6 mmHg  LEFT ATRIUM             Index        RIGHT ATRIUM           Index LA diam:        4.70 cm 2.52 cm/m   RA Pressure: 3.00 mmHg LA Vol (A2C):   65.4 ml 35.04 ml/m  RA Area:     10.70 cm LA Vol (A4C):   50.6 ml 27.11 ml/m  RA Volume:   21.30 ml  11.41 ml/m LA Biplane Vol: 59.5 ml 31.88 ml/m AORTIC VALVE AV Area (Vmax):    0.70 cm AV Area (Vmean):   0.60 cm AV Area (VTI):     0.56 cm AV Vmax:           351.00 cm/s AV Vmean:          273.000 cm/s AV VTI:            0.931 m AV Peak Grad:      49.3 mmHg AV Mean Grad:      31.0 mmHg LVOT Vmax:         86.50 cm/s LVOT Vmean:        57.600 cm/s LVOT VTI:          0.184 m LVOT/AV VTI ratio: 0.20 AI PHT:            401 msec  AORTA Ao Root diam: 3.40 cm Ao Asc diam:  3.40 cm  MITRAL VALVE                  TRICUSPID VALVE TR Peak grad:   31.6 mmHg MV Decel Time:                TR Vmax:        281.00 cm/s MR Peak  grad:    115.3 mmHg   Estimated RAP:  3.00 mmHg MR Mean grad:    55.0 mmHg    RVSP:           34.6 mmHg MR Vmax:         537.00 cm/s MR Vmean:        334.0 cm/s   SHUNTS MR PISA:         4.02 cm     Systemic VTI:  0.18 m MR PISA Eff ROA: 23 mm       Systemic Diam: 1.90 cm MR PISA Radius:  0.80 cm MV E velocity: 124.00 cm/s MV A velocity: 113.00 cm/s MV E/A ratio:  1.10  Epifanio Lesches MD Electronically signed by Epifanio Lesches MD Signature Date/Time: 07/23/2023/1:46:40 PM    Final          ______________________________________________________________________________________________      08/19/2022: ALT 43; B Natriuretic Peptide 663.9 08/22/2022: Magnesium 2.6 07/25/2023: BUN 42; Creatinine, Ser 1.81; NT-Pro BNP 766; Platelets 270 08/03/2023: Hemoglobin 8.5; Potassium 4.1; Sodium 143   STS RISK CALCULATOR: Pending  NHYA CLASS: 2    ASSESSMENT AND PLAN:   1. Nonrheumatic aortic valve stenosis   2. Diastolic dysfunction with acute on chronic heart failure (HCC)   3. Type 2 diabetes mellitus with complication, with long-term current use of insulin (HCC)   4. Hypertension associated with diabetes (HCC)   5. Hyperlipidemia associated with  type 2 diabetes mellitus (HCC)   6. Aortic atherosclerosis (HCC)   7. CKD stage 3 due to type 2 diabetes mellitus (HCC)   8. BMI 33.0-33.9,adult   9. PAF (paroxysmal atrial fibrillation) (HCC)   10. Secondary hypercoagulable state (HCC)   11. Mild CAD     Aortic stenosis: The patient has developed severe symptomatic aortic stenosis.  She is limited in her mobility due to spinal stenosis and other orthopedic issues.  We will refer the patient for a CTA and cardiothoracic surgical opinion.  Further recommendations will be offered once the CT scan has been evaluated. Diastolic dysfunction: Continue torsemide 40 mg twice daily, losartan 50 mg daily, Lopressor 25 mg twice daily and consider Jardiance in the future.  Will need  aortic stenosis managed. T2DM: Continue Xarelto 15 mg daily, atorvastatin 10 mg daily, losartan 50 mg daily, and consider SGLT2 inhibitor in the future. Hypertension: Continue amlodipine 10 mg daily, losartan 50 mg daily, Lopressor 25 mg twice daily.  Blood pressure is well-controlled today. Hyperlipidemia: Per primary cardiologist, goal LDL less than 70. Aortic atherosclerosis: Continue Xarelto 50 mg daily instead of aspirin and atorvastatin 10 mg. CKD stage III: Continue losartan 50 mg daily and consider SGLT2 inhibitor for renal protection. Elevated BMI: Consider referral to pharmacy for GLP-1 receptor agonist therapy after aortic stenosis has been managed. Paroxysmal atrial fibrillation: Continue Lopressor 25 mg twice daily and Xarelto 50 mg daily. Secondary hypercoagulable state: Continue Xarelto 15 mg daily. Mild CAD: Continue Xarelto 50 mg daily and atorvastatin 20 mg daily.   I have personally reviewed the patients imaging data as summarized above.  I have reviewed the natural history of aortic stenosis with the patient and family members who are present today. We have discussed the limitations of medical therapy and the poor prognosis associated with symptomatic aortic stenosis. We have also reviewed potential treatment options, including palliative medical therapy, conventional surgical aortic valve replacement, and transcatheter aortic valve replacement. We discussed treatment options in the context of this patient's specific comorbid medical conditions.   All of the patient's questions were answered today. Will make further recommendations based on the results of studies outlined above.   I spent 53 minutes reviewing all clinical data during and prior to this visit including all relevant imaging studies, laboratories, clinical information from other health systems and prior notes from both Cardiology and other specialties, interviewing the patient, conducting a complete physical  examination, and coordinating care in order to formulate a comprehensive and personalized evaluation and treatment plan.   Orbie Pyo, MD  08/06/2023 9:57 AM    [facility] [address] [place], [place], [place]  [address] Phone: [phone]; Fax: [phone]

## 2023-07-31 ENCOUNTER — Other Ambulatory Visit: Payer: Self-pay

## 2023-07-31 ENCOUNTER — Telehealth: Payer: Self-pay | Admitting: *Deleted

## 2023-07-31 NOTE — Telephone Encounter (Signed)
 Cardiac Catheterization scheduled at [facility] for: Friday August 03, 2023 10:30 AM Arrival time [facility] Main Entrance A at: 5:30 AM-pre-procedure hydration-per protocol GFR < 45 (30)  Nothing to eat after midnight prior to procedure, clear liquids until 5 AM day of procedure.  Medication instructions: -Hold:  Xarelto-none 08/01/23 until post procedure  Losartan/Torsemide-day before and day of procedure-per protocol GFR <60 (30)  Metformin-day of procedure and 48 hours post procedure  Insulin-AM of procedure -Other usual morning medications can be taken with sips of water including aspirin 81 mg.  Plan to go home the same day, you will only stay overnight if medically necessary.  You must have responsible adult to drive you home.  Someone must be with you the first 24 hours after you arrive home.  Reviewed procedure instructions/pre-procedure hydration with patient's husband (DPR), [place].

## 2023-08-03 ENCOUNTER — Encounter (HOSPITAL_COMMUNITY)
Admission: RE | Disposition: A | Discharge status: Home / Self Care | Payer: Self-pay | Source: Home / Self Care | Attending: Cardiology

## 2023-08-03 ENCOUNTER — Ambulatory Visit (HOSPITAL_COMMUNITY)
Admission: RE | Admit: 2023-08-03 | Discharge: 2023-08-03 | Disposition: A | Discharge status: Home / Self Care | Payer: PPO | Attending: Cardiology | Admitting: Cardiology

## 2023-08-03 DIAGNOSIS — Z79899 Other long term (current) drug therapy: Secondary | ICD-10-CM | POA: Insufficient documentation

## 2023-08-03 DIAGNOSIS — I471 Supraventricular tachycardia, unspecified: Secondary | ICD-10-CM | POA: Insufficient documentation

## 2023-08-03 DIAGNOSIS — I2584 Coronary atherosclerosis due to calcified coronary lesion: Secondary | ICD-10-CM | POA: Diagnosis not present

## 2023-08-03 DIAGNOSIS — D649 Anemia, unspecified: Secondary | ICD-10-CM | POA: Insufficient documentation

## 2023-08-03 DIAGNOSIS — I2510 Atherosclerotic heart disease of native coronary artery without angina pectoris: Secondary | ICD-10-CM | POA: Diagnosis not present

## 2023-08-03 DIAGNOSIS — I130 Hypertensive heart and chronic kidney disease with heart failure and stage 1 through stage 4 chronic kidney disease, or unspecified chronic kidney disease: Secondary | ICD-10-CM | POA: Insufficient documentation

## 2023-08-03 DIAGNOSIS — N1832 Chronic kidney disease, stage 3b: Secondary | ICD-10-CM | POA: Diagnosis not present

## 2023-08-03 DIAGNOSIS — E782 Mixed hyperlipidemia: Secondary | ICD-10-CM | POA: Diagnosis not present

## 2023-08-03 DIAGNOSIS — Z7901 Long term (current) use of anticoagulants: Secondary | ICD-10-CM | POA: Insufficient documentation

## 2023-08-03 DIAGNOSIS — E1122 Type 2 diabetes mellitus with diabetic chronic kidney disease: Secondary | ICD-10-CM | POA: Diagnosis not present

## 2023-08-03 DIAGNOSIS — I5033 Acute on chronic diastolic (congestive) heart failure: Secondary | ICD-10-CM | POA: Diagnosis not present

## 2023-08-03 DIAGNOSIS — I480 Paroxysmal atrial fibrillation: Secondary | ICD-10-CM | POA: Insufficient documentation

## 2023-08-03 DIAGNOSIS — I350 Nonrheumatic aortic (valve) stenosis: Secondary | ICD-10-CM | POA: Diagnosis not present

## 2023-08-03 HISTORY — PX: RIGHT/LEFT HEART CATH AND CORONARY ANGIOGRAPHY: CATH118266

## 2023-08-03 LAB — POCT I-STAT EG7
Acid-base deficit: 4 mmol/L — ABNORMAL HIGH (ref 0.0–2.0)
Acid-base deficit: 4 mmol/L — ABNORMAL HIGH (ref 0.0–2.0)
Bicarbonate: 20.9 mmol/L (ref 20.0–28.0)
Bicarbonate: 21 mmol/L (ref 20.0–28.0)
Calcium, Ion: 1.3 mmol/L (ref 1.15–1.40)
Calcium, Ion: 1.31 mmol/L (ref 1.15–1.40)
HCT: 25 % — ABNORMAL LOW (ref 36.0–46.0)
HCT: 25 % — ABNORMAL LOW (ref 36.0–46.0)
Hemoglobin: 8.5 g/dL — ABNORMAL LOW (ref 12.0–15.0)
Hemoglobin: 8.5 g/dL — ABNORMAL LOW (ref 12.0–15.0)
O2 Saturation: 55 %
O2 Saturation: 59 %
Potassium: 4.1 mmol/L (ref 3.5–5.1)
Potassium: 4.1 mmol/L (ref 3.5–5.1)
Sodium: 143 mmol/L (ref 135–145)
Sodium: 143 mmol/L (ref 135–145)
TCO2: 22 mmol/L (ref 22–32)
TCO2: 22 mmol/L (ref 22–32)
pCO2, Ven: 37 mmHg — ABNORMAL LOW (ref 44–60)
pCO2, Ven: 37.3 mmHg — ABNORMAL LOW (ref 44–60)
pH, Ven: 7.358 (ref 7.25–7.43)
pH, Ven: 7.359 (ref 7.25–7.43)
pO2, Ven: 30 mmHg — CL (ref 32–45)
pO2, Ven: 32 mmHg (ref 32–45)

## 2023-08-03 LAB — GLUCOSE, CAPILLARY
Glucose-Capillary: 100 mg/dL — ABNORMAL HIGH (ref 70–99)
Glucose-Capillary: 86 mg/dL (ref 70–99)

## 2023-08-03 LAB — POCT I-STAT 7, (LYTES, BLD GAS, ICA,H+H)
Acid-base deficit: 5 mmol/L — ABNORMAL HIGH (ref 0.0–2.0)
Bicarbonate: 19.7 mmol/L — ABNORMAL LOW (ref 20.0–28.0)
Calcium, Ion: 1.27 mmol/L (ref 1.15–1.40)
HCT: 24 % — ABNORMAL LOW (ref 36.0–46.0)
Hemoglobin: 8.2 g/dL — ABNORMAL LOW (ref 12.0–15.0)
O2 Saturation: 92 %
Potassium: 4.1 mmol/L (ref 3.5–5.1)
Sodium: 144 mmol/L (ref 135–145)
TCO2: 21 mmol/L — ABNORMAL LOW (ref 22–32)
pCO2 arterial: 35.2 mmHg (ref 32–48)
pH, Arterial: 7.357 (ref 7.35–7.45)
pO2, Arterial: 67 mmHg — ABNORMAL LOW (ref 83–108)

## 2023-08-03 SURGERY — RIGHT/LEFT HEART CATH AND CORONARY ANGIOGRAPHY
Anesthesia: LOCAL

## 2023-08-03 MED ORDER — HYDRALAZINE HCL 20 MG/ML IJ SOLN: 10.0000 mg | INTRAMUSCULAR | Status: DC | PRN

## 2023-08-03 MED ORDER — HEPARIN SODIUM (PORCINE) 1000 UNIT/ML IJ SOLN
INTRAMUSCULAR | Status: DC | PRN
  Administered 2023-08-03: 4000 [IU] via INTRAVENOUS

## 2023-08-03 MED ORDER — FENTANYL CITRATE (PF) 100 MCG/2ML IJ SOLN
INTRAMUSCULAR | Status: AC
  Filled 2023-08-03: qty 2

## 2023-08-03 MED ORDER — SODIUM CHLORIDE 0.9 % IV SOLN: 250.0000 mL | INTRAVENOUS | Status: DC | PRN

## 2023-08-03 MED ORDER — MIDAZOLAM HCL 2 MG/2ML IJ SOLN
INTRAMUSCULAR | Status: DC | PRN
  Administered 2023-08-03: .5 mg via INTRAVENOUS

## 2023-08-03 MED ORDER — LIDOCAINE HCL (PF) 1 % IJ SOLN
INTRAMUSCULAR | Status: DC | PRN
  Administered 2023-08-03: 5 mL

## 2023-08-03 MED ORDER — VERAPAMIL HCL 2.5 MG/ML IV SOLN
INTRAVENOUS | Status: DC | PRN
  Administered 2023-08-03: 10 mL via INTRA_ARTERIAL

## 2023-08-03 MED ORDER — ACETAMINOPHEN 325 MG PO TABS: 650.0000 mg | ORAL_TABLET | ORAL | Status: DC | PRN

## 2023-08-03 MED ORDER — LIDOCAINE HCL (PF) 1 % IJ SOLN
INTRAMUSCULAR | Status: AC
  Filled 2023-08-03: qty 30

## 2023-08-03 MED ORDER — SODIUM CHLORIDE 0.9% FLUSH: 3.0000 mL | Freq: Two times a day (BID) | INTRAVENOUS | Status: DC

## 2023-08-03 MED ORDER — ONDANSETRON HCL 4 MG/2ML IJ SOLN: 4.0000 mg | Freq: Four times a day (QID) | INTRAMUSCULAR | Status: DC | PRN

## 2023-08-03 MED ORDER — FUROSEMIDE 10 MG/ML IJ SOLN
INTRAMUSCULAR | Status: DC | PRN
  Administered 2023-08-03: 20 mg via INTRAVENOUS

## 2023-08-03 MED ORDER — HEPARIN SODIUM (PORCINE) 1000 UNIT/ML IJ SOLN
INTRAMUSCULAR | Status: AC
  Filled 2023-08-03: qty 10

## 2023-08-03 MED ORDER — FUROSEMIDE 10 MG/ML IJ SOLN
INTRAMUSCULAR | Status: AC
  Filled 2023-08-03: qty 4

## 2023-08-03 MED ORDER — HEPARIN (PORCINE) IN NACL 1000-0.9 UT/500ML-% IV SOLN
INTRAVENOUS | Status: DC | PRN
  Administered 2023-08-03 (×2): 500 mL via INTRA_ARTERIAL

## 2023-08-03 MED ORDER — FENTANYL CITRATE (PF) 100 MCG/2ML IJ SOLN
INTRAMUSCULAR | Status: DC | PRN
  Administered 2023-08-03: 12.5 ug via INTRAVENOUS

## 2023-08-03 MED ORDER — IOHEXOL 350 MG/ML SOLN
INTRAVENOUS | Status: DC | PRN
  Administered 2023-08-03: 22 mL via INTRA_ARTERIAL

## 2023-08-03 MED ORDER — SODIUM CHLORIDE 0.9 % WEIGHT BASED INFUSION: 1.0000 mL/kg/h | INTRAVENOUS | Status: DC

## 2023-08-03 MED ORDER — SODIUM CHLORIDE 0.9 % WEIGHT BASED INFUSION
3.0000 mL/kg/h | INTRAVENOUS | Status: AC
  Administered 2023-08-03: 3 mL/kg/h via INTRAVENOUS

## 2023-08-03 MED ORDER — ASPIRIN 81 MG PO CHEW: 81.0000 mg | CHEWABLE_TABLET | ORAL | Status: AC

## 2023-08-03 MED ORDER — MIDAZOLAM HCL 2 MG/2ML IJ SOLN
INTRAMUSCULAR | Status: AC
  Filled 2023-08-03: qty 2

## 2023-08-03 MED ORDER — SODIUM CHLORIDE 0.9% FLUSH: 3.0000 mL | INTRAVENOUS | Status: DC | PRN

## 2023-08-03 MED ORDER — VERAPAMIL HCL 2.5 MG/ML IV SOLN
INTRAVENOUS | Status: AC
  Filled 2023-08-03: qty 2

## 2023-08-03 MED ORDER — LABETALOL HCL 5 MG/ML IV SOLN: 10.0000 mg | INTRAVENOUS | Status: DC | PRN

## 2023-08-03 SURGICAL SUPPLY — 9 items
CATH 5FR JL3.5 JR4 ANG PIG MP (CATHETERS) IMPLANT
CATH BALLN WEDGE 5F 110CM (CATHETERS) IMPLANT
DEVICE RAD COMP TR BAND LRG (VASCULAR PRODUCTS) IMPLANT
GLIDESHEATH SLEND A-KIT 6F 22G (SHEATH) IMPLANT
GUIDEWIRE INQWIRE 1.5J.035X260 (WIRE) IMPLANT
INQWIRE 1.5J .035X260CM (WIRE) ×1 IMPLANT
PACK CARDIAC CATHETERIZATION (CUSTOM PROCEDURE TRAY) ×1 IMPLANT
SET ATX-X65L (MISCELLANEOUS) IMPLANT
SHEATH GLIDE SLENDER 4/5FR (SHEATH) IMPLANT

## 2023-08-03 NOTE — Discharge Instructions (Addendum)
 NO METFORMIN FOR 2 DAYS; MAY RESUME XARELTO TOMORROW

## 2023-08-03 NOTE — Progress Notes (Signed)
 Per Geoffry Paradise, NP client may resume xarelto tomorrow

## 2023-08-03 NOTE — Interval H&P Note (Signed)
 History and Physical Interval Note:  08/03/2023 8:44 AM  Danielle Beck  has presented today for surgery, with the diagnosis of Severe Aortic Stenosis.  The various methods of treatment have been discussed with the patient and family. After consideration of risks, benefits and other options for treatment, the patient has consented to  Procedure(s): RIGHT/LEFT HEART CATH AND CORONARY ANGIOGRAPHY (N/A) as a surgical intervention.  The patient's history has been reviewed, patient examined, no change in status, stable for surgery.  I have reviewed the patient's chart and labs.  Questions were answered to the patient's satisfaction.     Judyann Casasola J Shuna Tabor

## 2023-08-03 NOTE — H&P (Signed)
 OV 07/25/2023 copied for presentation   Cardiology Office Note:  .   Date:  08/03/2023  ID:  Danielle Beck, DOB Dec 12, 1950, MRN [id number] PCP: Debroah Loop, DO  [facility] Providers Cardiologist:  Truett Mainland, MD PCP: Debroah Loop, DO  C/c: Aortic stenosis    History of Present Illness: .    Danielle Beck is a 73 y.o. female with hypertension, diabetes mellitus, HFpEF, paroxysmal Afib/flutter, PSVT, mod AS, mod MR, HFpEF.   Patient is here today with her husband. In the last few weeks, she has noticed worsening exertional chest pain and shortness of breath.  She also notices orthopnea, and has been sleeping in a fairly upright position.  She also has noticed increased leg swelling, which improved after increasing torsemide to 20 mg twice daily.  Since then, she has dropped down to 40 mg in a.m. and 20 mg in PM.  Patient recently saw her primary care as well as nephrologist.  There was noted to be drop in GFR from 32 to to 26.  Reviewed recent echocardiogram 07/23/2023, discussed with the patient, details below.  Vitals:   08/03/23 0559  BP: 137/67  Pulse: 75  Resp: 20  Temp: 98.1 F (36.7 C)  SpO2: 93%      ROS:  Review of Systems  Cardiovascular:  Positive for chest pain, dyspnea on exertion and leg swelling. Negative for palpitations and syncope.     Studies Reviewed: Marland Kitchen       EKG 07/27/2023: Normal sinus rhythm 78 bpm Left axis deviation Left ventricular hypertrophy with QRS widening and repolarization abnormality ( R in aVL , Cornell product ) Cannot rule out Septal infarct (cited on or before 25-Jul-2023) When compared with ECG of 21-Aug-2022 20:47, No significant change was found   Independently interpreted 06/2023: eGFR 26 Hb 10  05/2023: Chol 172, TG 142, HDL 58, LDL 90 HbA1C 6.3% Cr 1.67, eGFR 32, K 5.0  External labs 12/2022: Creatinine 1.67, EGFR 33. LDL 90  EKG 08/31/2022: Sinus rhythm 79 bpm LAFB LVH Possible old  anteroseptal infarct  Independently interpreted  Echocardiogram 07/24/2023: Mild LVH.  LVEF 55 to 60%.  Grade 2 diastolic dysfunction.  GLS -18%. Normal RV systolic function.  Estimated RVSP 35 [name]. Mild to moderate mitral regurgitation. Severely calcified aortic valve.  Severe arctic stenosis.  V-max 3.5 m/s, mean gradient 31 mmHg, AVA 0.6 cm by continued equation, dimensionless index 0.19.  Low stroke-volume index 28 cc/m.  Findings consistent with paradoxical low-flow low gradient severe aortic stenosis. In comparison, previous echocardiogram in 02/2019 for reported mean pressure gradient 25 mmHg, AVA 0.75 cm,   1. Left ventricular ejection fraction, by estimation, is 55 to 60%. Left  ventricular ejection fraction by 3D volume is 56 %. The left ventricle has  normal function. The left ventricle has no regional wall motion  abnormalities. There is mild left  ventricular hypertrophy. Left ventricular diastolic parameters are  consistent with Grade II diastolic dysfunction (pseudonormalization).  Elevated left atrial pressure. The average left ventricular global  longitudinal strain is -18.0 %. The global  longitudinal strain is normal.   2. Right ventricular systolic function is normal. The right ventricular  size is normal. There is normal pulmonary artery systolic pressure. The  estimated right ventricular systolic pressure is 34.6 mmHg.   3. The mitral valve is degenerative. Mild to moderate mitral valve  regurgitation.   4. The inferior vena cava is normal in size with greater than 50%  respiratory variability, suggesting  right atrial pressure of 3 mmHg.   5. The aortic valve is calcified. There is severe calcifcation of the  aortic valve. Aortic valve regurgitation is trivial. Severe aortic valve  stenosis. Moderate AS by gradients (Vmax 3.5 m/s, MG ), severe by  AVA (0.6 cm^2) and DI (0.19). Low SV  index (28 cc/m^2). Consistent with paradoxical low flow low gradient   severe AS    Echocardiogram 08/21/2022: 1. Left ventricular ejection fraction, by estimation, is 55 to 60%. The  left ventricle has normal function. The left ventricle has no regional  wall motion abnormalities. Left ventricular diastolic parameters are  indeterminate.   2. Right ventricular systolic function is normal. The right ventricular  size is normal.   3. The aortic valve is calcified. Moderate aortic valve stenosis. Aortic  valve area, by VTI measures 1.05 cm. Aortic valve mean gradient measures  18.0 mmHg. Aortic valve Vmax measures 2.71 m/s.   4. The inferior vena cava is normal in size with <50% respiratory  variability, suggesting right atrial pressure of 8 mmHg.   Comparison(s): A prior study was performed on 04/06/2022. AS and MR are no  worse than before.    Lexiscan  Nuclear stress test 05/04/2022: Myocardial perfusion is abnormal. Breast tissue attenuation noted in inferior wall. Cannot exclude mild degree medium extent perfusion defect consistent with ischemia located in the apical inferior wall and mid inferior wall. The LV appears slightly dilated in the stress images, although TID index is insignificant. Overall LV systolic function is normal without regional wall motion abnormalities.  Stress LV EF: 54%.  Non-diagnostic ECG stress. The heart rate response was consistent with Regadenoson.  Compared to previous study 03/24/2019, soft tissue attenuation was previously noted. Possible inferior ischemia is new.  Intermediate risk.    Labs March-April 2024: Glucose 114, BUN/Cr 42/1.51. EGFR 37. Na/K 140/4.6 NT proBNP 577 H/H 9.2/25.7. MCV 89. Platelets 246 No recent lipid panel    Risk Assessment/Calculations:   CHA2DS2-VASc Score is  5, annual stroke risk 7.2% -(CHF; HTN; vasc disease DM,  Female = 1; Age <65 =0; 65-74 = 1,  >75 =2; stroke = 2).  -(Yearly risk of stroke: Score of 1=1.3; 2=2.2; 3=3.2; 4=4; 5=6.7; 6=9.8; 7=>9.8)   Physical Exam:   Physical  Exam Vitals and nursing note reviewed.  Constitutional:      General: She is not in acute distress. Neck:     Vascular: No JVD.  Cardiovascular:     Rate and Rhythm: Normal rate and regular rhythm.     Heart sounds: Murmur heard.     Harsh midsystolic murmur is present with a grade of 3/6 at the upper right sternal border radiating to the neck.  Pulmonary:     Effort: Pulmonary effort is normal.     Breath sounds: Normal breath sounds. No wheezing or rales.  Musculoskeletal:     Right lower leg: Edema (1+) present.     Left lower leg: Edema (1+) present.      VISIT DIAGNOSES: No diagnosis found.     ASSESSMENT AND PLAN: .    Danielle Beck is a 73 y.o. female with hypertension, diabetes mellitus, HFpEF, mod AS, mod MR,  paroxysmal Afib/flutter, PSVT   HFpEF: Acute on chronic HFpEF with recent worsening symptoms and leg edema. I reckon this is brought upon by worsening of her aortic stenosis. Given her tenuous renal function, I have continued her torsemide at 40 mg in a.m., 20 mg in p.m. Okay to continue  losartan 50 mg daily, but I have stopped her Farxiga 10 mg daily given worsening renal function and concern for hypertension in the setting of severe aortic stenosis.  This should also help with eventual anesthesia for TAVR, as Marcelline Deist can slow down gastric emptying. If there is increasing swelling after stopping Marcelline Deist, an additional dose of torsemide 20 mg can be added in the afternoon.  Severe aortic stenosis: Progressive worsening of aortic stenosis gradients and valve area, suggestive of paradoxically low-flow low gradient severe aortic stenosis. At this stage, I recommend proceeding with workup for potential TAVR. Obviously, she will need right and left heart catheterization and gated CT angiogram prior to that.  There has been slight drop in her renal function lately with EGFR dropping from 32 to 26.  I discussed potential risks of contrast-induced nephropathy and  requiring at least temporary dialysis with the patient and her husband at length.  I will also reach out to patient's nephrologist Dr. Ronalee Belts to get his input regarding any additional recommendations to reduce the risk of contrast-induced nephropathy. With that in mind, I would probably opt for femoral access for her right and left heart catheterization to reduce contrast dye, as well as preserve her radial arteries for future use for potential AV fistula.  That said, if this is anatomically limiting given her obesity, we may have to use right radial artery access for that reason.   Paroxysmal Afib/flutter + PSVT: Currently in sinus rhythm.   CHA2DS2VASc score 4, annual stroke risk 5%. Continue Xarelto 15 mg daily, given GFR <50. Stable normocytic anemia. Continue Xarelto 15 mg daily.   Hypertension: Controlled  Mixed hyperlipidemia: LDL 90, slightly suboptimal. Given her worsening renal function, I will not make changes to her statin. In future, we could consider additional injectable therapy for her hyperlipidemia.  Meds ordered this encounter  Medications   aspirin chewable tablet 81 mg   FOLLOWED BY Linked Order Group    0.9% sodium chloride infusion    0.9% sodium chloride infusion    I spent 50 minutes in the care of Danielle Beck today including reviewing labs (CBC, BMP from 05/2023, and 06/2023), reviewing studies (echocardiogram 07/20/2023, and comparing and personally reviewing prior echocardiogram from 02/2023), face to face time discussing treatment options (C aortic stenosis in the setting of CKD stage IIIb/IV), and documenting in the encounter. ,  As well as care coordination.   F/u in 6 months  Signed, Elder Negus, MD

## 2023-08-05 ENCOUNTER — Encounter (HOSPITAL_COMMUNITY): Payer: Self-pay | Admitting: Cardiology

## 2023-08-06 ENCOUNTER — Other Ambulatory Visit: Payer: Self-pay

## 2023-08-06 ENCOUNTER — Encounter: Payer: Self-pay | Admitting: Internal Medicine

## 2023-08-06 ENCOUNTER — Ambulatory Visit: Payer: PPO | Attending: Internal Medicine | Admitting: Internal Medicine

## 2023-08-06 VITALS — BP 128/54 | HR 75 | Ht 61.5 in | Wt 185.0 lb

## 2023-08-06 DIAGNOSIS — Z6833 Body mass index (BMI) 33.0-33.9, adult: Secondary | ICD-10-CM

## 2023-08-06 DIAGNOSIS — E1169 Type 2 diabetes mellitus with other specified complication: Secondary | ICD-10-CM

## 2023-08-06 DIAGNOSIS — I480 Paroxysmal atrial fibrillation: Secondary | ICD-10-CM

## 2023-08-06 DIAGNOSIS — E1122 Type 2 diabetes mellitus with diabetic chronic kidney disease: Secondary | ICD-10-CM

## 2023-08-06 DIAGNOSIS — I5033 Acute on chronic diastolic (congestive) heart failure: Secondary | ICD-10-CM | POA: Diagnosis not present

## 2023-08-06 DIAGNOSIS — Z794 Long term (current) use of insulin: Secondary | ICD-10-CM

## 2023-08-06 DIAGNOSIS — I700 Atherosclerosis of aorta: Secondary | ICD-10-CM | POA: Diagnosis not present

## 2023-08-06 DIAGNOSIS — I350 Nonrheumatic aortic (valve) stenosis: Secondary | ICD-10-CM

## 2023-08-06 DIAGNOSIS — N183 Chronic kidney disease, stage 3 unspecified: Secondary | ICD-10-CM

## 2023-08-06 DIAGNOSIS — E118 Type 2 diabetes mellitus with unspecified complications: Secondary | ICD-10-CM

## 2023-08-06 DIAGNOSIS — D6869 Other thrombophilia: Secondary | ICD-10-CM | POA: Diagnosis not present

## 2023-08-06 DIAGNOSIS — I152 Hypertension secondary to endocrine disorders: Secondary | ICD-10-CM

## 2023-08-06 DIAGNOSIS — E785 Hyperlipidemia, unspecified: Secondary | ICD-10-CM

## 2023-08-06 DIAGNOSIS — I2510 Atherosclerotic heart disease of native coronary artery without angina pectoris: Secondary | ICD-10-CM

## 2023-08-06 DIAGNOSIS — E1159 Type 2 diabetes mellitus with other circulatory complications: Secondary | ICD-10-CM | POA: Diagnosis not present

## 2023-08-06 NOTE — Patient Instructions (Signed)
 Medication Instructions:  Your physician recommends that you continue on your current medications as directed. Please refer to the Current Medication list given to you today.  *If you need a refill on your cardiac medications before your next appointment, please call your pharmacy*  Lab Work: On August 13, 2023 at [facility]: [employer] may go to any of these [facility] locations:   [facility] - [id number] ([facility]) - [address] [phone] [address]   [facility] - [address] - [address] [employer] [address]   If you have labs (blood work) drawn today and your tests are completely normal, you will receive your results only by: [employer] (if you have MyChart) OR A paper copy in the mail If you have any lab test that is abnormal or we need to change your treatment, we will call you to review the results.  Testing/Procedures: Your physician has requested that you have cardiac CT. Cardiac computed tomography (CT) is a painless test that uses an x-ray machine to take clear, detailed pictures of your heart. For further information please visit [email or web address] Please follow instruction sheet as given.  Follow-Up: Will be arrange through structural team.  Other Instructions   1st Floor: - Lobby - Registration  - Pharmacy  - Lab - Cafe  2nd Floor: - PV Lab - Diagnostic Testing (echo, CT, nuclear med)  3rd Floor: - Vacant  4th Floor: - TCTS (cardiothoracic surgery) - AFib Clinic - Structural Heart Clinic - Vascular Surgery  - Vascular Ultrasound  5th Floor: - [facility] (general and EP) - Clinical Pharmacy for coumadin, hypertension, lipid, weight-loss medications, and med management appointments    Valet parking services will be available as well.

## 2023-08-06 NOTE — Progress Notes (Addendum)
 Pre Surgical Assessment: 5 M Walk Test  23M=16.34ft  5 Meter Walk Test- trial 1: 10.66 seconds 5 Meter Walk Test- trial 2: 8.95 seconds 5 Meter Walk Test- trial 3: 8.61 seconds 5 Meter Walk Test Average: 9.41 seconds   ____________________  Procedure Type: Isolated AVR Perioperative Outcome Estimate % Operative Mortality 2.19% Morbidity & Mortality 10% Stroke 0.997% Renal Failure 2.01% Reoperation 2.53% Prolonged Ventilation 5.65% Deep Sternal Wound Infection 0.073% Long Hospital Stay (>14 days) 5.41% Short Hospital Stay (<6 days)* 36%

## 2023-08-07 ENCOUNTER — Other Ambulatory Visit: Payer: Self-pay

## 2023-08-08 NOTE — Progress Notes (Signed)
 Thank you. Will keep Dr. Lynnette Caffey in the loop.  Thanks [employer]

## 2023-08-09 DIAGNOSIS — E113512 Type 2 diabetes mellitus with proliferative diabetic retinopathy with macular edema, left eye: Secondary | ICD-10-CM | POA: Diagnosis not present

## 2023-08-10 ENCOUNTER — Other Ambulatory Visit (HOSPITAL_COMMUNITY): Payer: Self-pay

## 2023-08-10 ENCOUNTER — Other Ambulatory Visit: Payer: Self-pay

## 2023-08-10 MED ORDER — TIMOLOL MALEATE 0.5 % OP SOLN
1.0000 [drp] | Freq: Two times a day (BID) | OPHTHALMIC | 11 refills | Status: DC
Start: 2023-08-10 — End: 2023-08-31
  Filled 2023-08-10: qty 10, 90d supply, fill #0

## 2023-08-10 MED ORDER — OFLOXACIN 0.3 % OP SOLN
1.0000 [drp] | Freq: Three times a day (TID) | OPHTHALMIC | 0 refills | Status: DC
  Filled 2023-08-10: qty 5, 15d supply, fill #0

## 2023-08-10 MED ORDER — LATANOPROST 0.005 % OP SOLN
1.0000 [drp] | Freq: Every day | OPHTHALMIC | 11 refills | Status: DC
  Filled 2023-08-10: qty 2.5, 25d supply, fill #0
  Filled 2023-08-31: qty 2.5, 25d supply, fill #1

## 2023-08-10 MED ORDER — DORZOLAMIDE HCL 2 % OP SOLN
1.0000 [drp] | Freq: Two times a day (BID) | OPHTHALMIC | 11 refills | Status: DC
  Filled 2023-08-10: qty 10, 90d supply, fill #0
  Filled 2023-08-22: qty 10, 90d supply, fill #1

## 2023-08-11 ENCOUNTER — Other Ambulatory Visit (HOSPITAL_COMMUNITY): Payer: Self-pay

## 2023-08-13 ENCOUNTER — Other Ambulatory Visit: Payer: Self-pay

## 2023-08-13 DIAGNOSIS — E1122 Type 2 diabetes mellitus with diabetic chronic kidney disease: Secondary | ICD-10-CM | POA: Diagnosis not present

## 2023-08-13 DIAGNOSIS — N183 Chronic kidney disease, stage 3 unspecified: Secondary | ICD-10-CM | POA: Diagnosis not present

## 2023-08-13 DIAGNOSIS — E1159 Type 2 diabetes mellitus with other circulatory complications: Secondary | ICD-10-CM | POA: Diagnosis not present

## 2023-08-13 DIAGNOSIS — I152 Hypertension secondary to endocrine disorders: Secondary | ICD-10-CM | POA: Diagnosis not present

## 2023-08-14 ENCOUNTER — Encounter: Payer: Self-pay | Admitting: Internal Medicine

## 2023-08-14 LAB — BASIC METABOLIC PANEL
BUN/Creatinine Ratio: 27 (ref 12–28)
BUN: 36 mg/dL — ABNORMAL HIGH (ref 8–27)
CO2: 20 mmol/L (ref 20–29)
Calcium: 10.2 mg/dL (ref 8.7–10.3)
Chloride: 104 mmol/L (ref 96–106)
Creatinine, Ser: 1.35 mg/dL — ABNORMAL HIGH (ref 0.57–1.00)
Glucose: 117 mg/dL — ABNORMAL HIGH (ref 70–99)
Potassium: 5.2 mmol/L (ref 3.5–5.2)
Sodium: 138 mmol/L (ref 134–144)
eGFR: 42 mL/min/{1.73_m2} — ABNORMAL LOW (ref >59)

## 2023-08-23 ENCOUNTER — Other Ambulatory Visit (HOSPITAL_COMMUNITY): Payer: Self-pay

## 2023-08-23 ENCOUNTER — Encounter (HOSPITAL_COMMUNITY): Payer: Self-pay | Admitting: Pharmacist

## 2023-08-27 ENCOUNTER — Ambulatory Visit (HOSPITAL_COMMUNITY)
Admission: RE | Admit: 2023-08-27 | Discharge: 2023-08-27 | Disposition: A | Discharge status: Home / Self Care | Source: Ambulatory Visit | Attending: Internal Medicine | Admitting: Internal Medicine

## 2023-08-27 ENCOUNTER — Ambulatory Visit (HOSPITAL_BASED_OUTPATIENT_CLINIC_OR_DEPARTMENT_OTHER)
Admission: RE | Admit: 2023-08-27 | Discharge: 2023-08-27 | Disposition: A | Discharge status: Home / Self Care | Source: Ambulatory Visit | Attending: Internal Medicine | Admitting: Internal Medicine

## 2023-08-27 DIAGNOSIS — I2510 Atherosclerotic heart disease of native coronary artery without angina pectoris: Secondary | ICD-10-CM | POA: Diagnosis not present

## 2023-08-27 DIAGNOSIS — N183 Chronic kidney disease, stage 3 unspecified: Secondary | ICD-10-CM | POA: Diagnosis not present

## 2023-08-27 DIAGNOSIS — I350 Nonrheumatic aortic (valve) stenosis: Secondary | ICD-10-CM | POA: Insufficient documentation

## 2023-08-27 DIAGNOSIS — E1122 Type 2 diabetes mellitus with diabetic chronic kidney disease: Secondary | ICD-10-CM | POA: Diagnosis not present

## 2023-08-27 DIAGNOSIS — R911 Solitary pulmonary nodule: Secondary | ICD-10-CM | POA: Diagnosis not present

## 2023-08-27 MED ORDER — IOHEXOL 350 MG/ML SOLN
95.0000 mL | Freq: Once | INTRAVENOUS | Status: AC | PRN
  Administered 2023-08-27: 95 mL via INTRAVENOUS

## 2023-08-27 MED ORDER — SODIUM CHLORIDE 0.9 % WEIGHT BASED INFUSION
3.0000 mL/kg/h | INTRAVENOUS | Status: AC
  Administered 2023-08-27: 3.004 mL/kg/h via INTRAVENOUS

## 2023-08-27 MED ORDER — SODIUM CHLORIDE 0.9 % WEIGHT BASED INFUSION: 1.0000 mL/kg/h | INTRAVENOUS | Status: DC

## 2023-08-28 ENCOUNTER — Other Ambulatory Visit: Payer: Self-pay

## 2023-08-29 ENCOUNTER — Other Ambulatory Visit (HOSPITAL_COMMUNITY): Payer: Self-pay

## 2023-08-31 ENCOUNTER — Other Ambulatory Visit (HOSPITAL_COMMUNITY): Payer: Self-pay

## 2023-08-31 ENCOUNTER — Encounter: Payer: Self-pay | Admitting: Physician Assistant

## 2023-08-31 ENCOUNTER — Other Ambulatory Visit: Payer: Self-pay

## 2023-08-31 DIAGNOSIS — E113511 Type 2 diabetes mellitus with proliferative diabetic retinopathy with macular edema, right eye: Secondary | ICD-10-CM | POA: Diagnosis not present

## 2023-09-04 ENCOUNTER — Other Ambulatory Visit (HOSPITAL_COMMUNITY): Payer: Self-pay

## 2023-09-05 ENCOUNTER — Other Ambulatory Visit (HOSPITAL_COMMUNITY): Payer: Self-pay

## 2023-09-05 NOTE — Progress Notes (Unsigned)
 [address]       [place] [address]             [phone]           Danielle Beck Naranja Medical Record #[id number] Date of Birth: [birth year]  Orbie Pyo, MD Debroah Loop, DO  Chief Complaint:   Increasing DOE   History of Present Illness:     Pt is a 73 yo female with increasing DOE, chest pressure who has been followed over the past several years for AS. Recent TTE with EF of 55% and mean gradient of 31 mmHg with a AVA of 0.56cm2 and a DVI of 0.2. Pt was felt secondary to her symptoms and severity of the AS to be candidate for AVR and underwent cath with moderate CAD and TAVR CTA with femoral access for TAVR and and sized to a 26mm Sapien valve. She has had back issues and has had mobility issues int the past      Past Medical History:  Diagnosis Date   CHF (congestive heart failure) (HCC)    Diabetes mellitus without complication (HCC)    Hyperlipidemia    Hypertension    Severe aortic stenosis     Past Surgical History:  Procedure Laterality Date   CATARACT EXTRACTION     RIGHT/LEFT HEART CATH AND CORONARY ANGIOGRAPHY N/A 08/03/2023   Procedure: RIGHT/LEFT HEART CATH AND CORONARY ANGIOGRAPHY;  Surgeon: Elder Negus, MD;  Location: [facility];  Service: Cardiovascular;  Laterality: N/A;   TUBAL LIGATION  1985    Social History   Tobacco Use  Smoking Status Never  Smokeless Tobacco Never    Social History   Substance and Sexual Activity  Alcohol Use Not Currently    Social History   Socioeconomic History   Marital status: Married    Spouse name: Not on file   Number of children: 3   Years of education: Not on file   Highest education level: Not on file  Occupational History   Not on file  Tobacco Use   Smoking status: Never   Smokeless tobacco: Never  Vaping Use   Vaping status: Never Used  Substance and Sexual Activity   Alcohol use: Not Currently   Drug use: Not Currently   Sexual  activity: Not on file  Other Topics Concern   Not on file  Social History Narrative   Not on file   Social Drivers of Health   Financial Resource Strain: Not on file  Food Insecurity: Not on file  Transportation Needs: Not on file  Physical Activity: Not on file  Stress: Not on file  Social Connections: Not on file  Intimate Partner Violence: Not on file    No Known Allergies  Current Outpatient Medications  Medication Sig Dispense Refill   Alcohol Swabs (ALCOHOL PREP) 70 % PADS Use as directed aily 100 each 0   amLODipine (NORVASC) 10 MG tablet Take 1 tablet (10 mg total) by mouth daily. 100 tablet 1   atorvastatin (LIPITOR) 10 MG tablet Take 2 tablets (20 mg total) by mouth daily for cholesterol. 100 tablet 1   Blood Glucose Monitoring Suppl (ONE TOUCH ULTRA 2) w/Device KIT Use to check blood sugar as directed 1 kit 1   Calcium Carb-Cholecalciferol (CALCIUM 600 + D PO) Take 1 tablet by mouth 2 (two) times daily.     Cholecalciferol (VITAMIN D3) 1000 units CAPS Take 1,000 Units by mouth daily.  cyanocobalamin (VITAMIN B12) 1000 MCG tablet Take 1,000 mcg by mouth daily.     dorzolamide (TRUSOPT) 2 % ophthalmic solution Place 1 drop into the left eye 2 (two) times daily.     Ferrous Sulfate (IRON) 325 (65 Fe) MG TABS Take 325 mg by mouth 2 (two) times daily.     glucose blood (ONETOUCH ULTRA TEST) test strip Use as directed to check blood sugar daily. 100 each 3   insulin glargine (LANTUS SOLOSTAR) 100 UNIT/ML Solostar Pen Inject 34 Units into the skin daily. 9 mL 5   Insulin Pen Needle (DROPLET PEN NEEDLES) 32G X 6 MM MISC Use to inject insulin once daily. 100 each 0   Lancets (ONETOUCH DELICA PLUS LANCET33G) MISC Use to check blood sugar daily. 100 each 3   latanoprost (XALATAN) 0.005 % ophthalmic solution Place 1 drop into both eyes at bedtime.      losartan (COZAAR) 50 MG tablet Take 1 tablet (50 mg total) by mouth daily for blood pressure. 100 tablet 1   metFORMIN  (GLUCOPHAGE) 1000 MG tablet Take 1 tablet (1,000 mg total) by mouth 2 (two) times daily with a meal. 200 tablet 1   methocarbamol (ROBAXIN) 500 MG tablet Take 2 tablets (1,000 mg total) by mouth daily as needed for muscle spasms. (Patient taking differently: Take 1,000 mg by mouth daily.) 60 tablet 11   metoprolol tartrate (LOPRESSOR) 25 MG tablet Take 1 tablet (25 mg total) by mouth 2 (two) times daily. 180 tablet 3   nitroGLYCERIN (NITROSTAT) 0.4 MG SL tablet Place 1 tablet (0.4 mg total) under the tongue every 5 (five) minutes as needed for chest pain. 30 tablet 3   pregabalin (LYRICA) 75 MG capsule Take 1 capsule (75 mg total) by mouth 2 (two) times daily. (Patient taking differently: Take 150 mg by mouth daily.) 60 capsule 5   pyridOXINE (VITAMIN B-6) 100 MG tablet Take 100 mg by mouth daily.     Thiamine HCl (VITAMIN B-1) 250 MG tablet Take 250 mg by mouth daily.     timolol (TIMOPTIC) 0.5 % ophthalmic solution Place 1 drop into the left eye 2 (two) times daily.     torsemide (DEMADEX) 20 MG tablet Take 2 tablets (40 mg total) by mouth 2 (two) times daily. (Patient taking differently: Take 20-40 mg by mouth See admin instructions. Take 40 mg in the morning and 20-40 mg in the afternoon) 360 tablet 0   traMADol (ULTRAM) 50 MG tablet Take 1 tablet (50 mg total) by mouth every 6 (six) hours as needed for moderate pain 10 tablet 5   XARELTO 15 MG TABS tablet TAKE 1 TABLET EVERY DAY WITH SUPPER 90 tablet 3   No current facility-administered medications for this visit.     Family History  Problem Relation Age of Onset   Diabetes Mother    Heart failure Father    Diabetes Brother    Diabetes Brother    Diabetes Brother    Hypertension Brother    Breast cancer Neg Hx        Physical Exam: Teeth in good repair Lungs; clear Card: RR with high pitched murmur Ext: no edema Neuro: intact     Diagnostic Studies & Laboratory data: I have personally reviewed the following studies and  agree with the findings   TTE (06/2023) IMPRESSIONS     1. Left ventricular ejection fraction, by estimation, is 55 to 60%. Left  ventricular ejection fraction by 3D volume is 56 %. The left  ventricle has  normal function. The left ventricle has no regional wall motion  abnormalities. There is mild left  ventricular hypertrophy. Left ventricular diastolic parameters are  consistent with Grade II diastolic dysfunction (pseudonormalization).  Elevated left atrial pressure. The average left ventricular global  longitudinal strain is -18.0 %. The global  longitudinal strain is normal.   2. Right ventricular systolic function is normal. The right ventricular  size is normal. There is normal pulmonary artery systolic pressure. The  estimated right ventricular systolic pressure is 34.6 mmHg.   3. The mitral valve is degenerative. Mild to moderate mitral valve  regurgitation.   4. The inferior vena cava is normal in size with greater than 50%  respiratory variability, suggesting right atrial pressure of 3 mmHg.   5. The aortic valve is calcified. There is severe calcifcation of the  aortic valve. Aortic valve regurgitation is trivial. Severe aortic valve  stenosis. Moderate AS by gradients (Vmax 3.5 m/s, MG ), severe by  AVA (0.6 cm^2) and DI (0.19). Low SV  index (28 cc/m^2). Consistent with paradoxical low flow low gradient  severe AS   FINDINGS   Left Ventricle: Left ventricular ejection fraction, by estimation, is 55  to 60%. Left ventricular ejection fraction by 3D volume is 56 %. The left  ventricle has normal function. The left ventricle has no regional wall  motion abnormalities. The average  left ventricular global longitudinal strain is -18.0 %. Strain was  performed and the global longitudinal strain is normal. The left  ventricular internal cavity size was normal in size. There is mild left  ventricular hypertrophy. Left ventricular  diastolic parameters are consistent  with Grade II diastolic dysfunction  (pseudonormalization). Elevated left atrial pressure.   Right Ventricle: The right ventricular size is normal. No increase in  right ventricular wall thickness. Right ventricular systolic function is  normal. There is normal pulmonary artery systolic pressure. The tricuspid  regurgitant velocity is 2.81 m/s, and   with an assumed right atrial pressure of 3 mmHg, the estimated right  ventricular systolic pressure is 34.6 mmHg.   Left Atrium: Left atrial size was normal in size.   Right Atrium: Right atrial size was normal in size.   Pericardium: There is no evidence of pericardial effusion.   Mitral Valve: The mitral valve is degenerative in appearance. Mild to  moderate mitral valve regurgitation.   Tricuspid Valve: The tricuspid valve is normal in structure. Tricuspid  valve regurgitation is trivial.   Aortic Valve: The aortic valve is calcified. There is severe calcifcation  of the aortic valve. Aortic valve regurgitation is trivial. Aortic  regurgitation PHT measures 401 msec. Severe aortic stenosis is present.  Aortic valve mean gradient measures 31.0  mmHg. Aortic valve peak gradient measures 49.3 mmHg. Aortic valve area, by  VTI measures 0.56 cm.   Pulmonic Valve: The pulmonic valve was grossly normal. Pulmonic valve  regurgitation is trivial.   Aorta: The aortic root and ascending aorta are structurally normal, with  no evidence of dilitation.   Venous: The inferior vena cava is normal in size with greater than 50%  respiratory variability, suggesting right atrial pressure of 3 mmHg.   IAS/Shunts: The interatrial septum was not well visualized.   Additional Comments: 3D was performed not requiring image post processing  on an independent workstation and was normal.     LEFT VENTRICLE  PLAX 2D  LVIDd:         4.80 cm  Diastology  LVIDs:         3.30 cm         LV e' medial:    5.98 cm/s  LV PW:         1.20 cm          LV E/e' medial:  20.7  LV IVS:        1.00 cm         LV e' lateral:   11.90 cm/s  LVOT diam:     1.90 cm         LV E/e' lateral: 10.4  LV SV:         52  LV SV Index:   28              2D  LVOT Area:     2.84 cm        Longitudinal                                 Strain                                 2D Strain GLS  -18.0 %                                 Avg:                                   3D Volume EF                                 LV 3D EF:    Left                                              ventricul                                              ar                                              ejection                                              fraction                                              by 3D  volume is                                              56 %.                                   3D Volume EF:                                 3D EF:        56 %                                 LV EDV:       105 ml                                 LV ESV:       46 ml                                 LV SV:        59 ml   RIGHT VENTRICLE  RV Basal diam:  2.70 cm  RV S prime:     11.30 cm/s  TAPSE (M-mode): 2.2 cm  RVSP:           34.6 mmHg   LEFT ATRIUM             Index        RIGHT ATRIUM           Index  LA diam:        4.70 cm 2.52 cm/m   RA Pressure: 3.00 mmHg  LA Vol (A2C):   65.4 ml 35.04 ml/m  RA Area:     10.70 cm  LA Vol (A4C):   50.6 ml 27.11 ml/m  RA Volume:   21.30 ml  11.41 ml/m  LA Biplane Vol: 59.5 ml 31.88 ml/m   AORTIC VALVE  AV Area (Vmax):    0.70 cm  AV Area (Vmean):   0.60 cm  AV Area (VTI):     0.56 cm  AV Vmax:           351.00 cm/s  AV Vmean:          273.000 cm/s  AV VTI:            0.931 m  AV Peak Grad:      49.3 mmHg  AV Mean Grad:      31.0 mmHg  LVOT Vmax:         86.50 cm/s  LVOT Vmean:        57.600 cm/s  LVOT VTI:          0.184 m  LVOT/AV VTI ratio: 0.20  AI PHT:             401 msec    AORTA  Ao Root diam: 3.40 cm  Ao Asc diam:  3.40 cm   MITRAL VALVE                  TRICUSPID VALVE  TR Peak grad:   31.6 mmHg  MV Decel Time:                TR Vmax:        281.00 cm/s  MR Peak grad:    115.3 mmHg   Estimated RAP:  3.00 mmHg  MR Mean grad:    55.0 mmHg    RVSP:           34.6 mmHg  MR Vmax:         537.00 cm/s  MR Vmean:        334.0 cm/s   SHUNTS  MR PISA:         4.02 cm     Systemic VTI:  0.18 m  MR PISA Eff ROA: 23 mm       Systemic Diam: 1.90 cm  MR PISA Radius:  0.80 cm  MV E velocity: 124.00 cm/s  MV A velocity: 113.00 cm/s  MV E/A ratio:  1.10   CATH (07/2023) Conclusion  Coronary angiography 08/03/2023: LM: Normal LAD: Mild calcification          Prox-mid 30% disease Lcx: Large vessel        50% mid stenosis between ectatic segment on either side RCA: Mild calcification with mild diffuse disease      Right heart catheterization 08/03/2023: RA: 9 mmHg RV: 44/4 mmHg PA: 42/20 mmHg, mPAP 36 mmHg PCW: 24 mmHg   AO sats: 92% PA sats: 57%   CO: 5.0 L/min CI: 2.7 L/min/m2   Two vessel nonobstructive CAD No proximal CAD Moderate PH, WHO Grp II Mildly elevated filling pressures    Recent Radiology Findings:   CTA (07/2023) FINDINGS: Aortic Valve: Calcified tri leaflet AV with score 2394   Aorta: Normal arch vessels mild calcific atherosclerosis no aneurysm   Sino-tubular Junction: 27.3 mm   Ascending Thoracic Aorta: 32 mm   Aortic Arch: 26 mm   Descending Thoracic Aorta: 21 mm   Sinus of Valsalva Measurements:   Non-coronary: 30.8 mm  Height 19.2 mm   Right - coronary: 28.1 mm  Height 18.9 mm   Left -   coronary: 30 mm  Height 19.1 mm   Coronary Artery Height above Annulus:   Left Main: 14.8 mm above annulus   Right Coronary: 15.4 mm above annulus   Virtual Basal Annulus Measurements:   Maximum / Minimum Diameter: 27.4 mm x 21.4 mm Average diameter 24.4 mm   Perimeter: 78 mm    Area: 467 mm2   Coronary Arteries: Sufficient height above annulus for deployment   Optimum Fluoroscopic Angle for Delivery: LAO 10 Caudal 5 degrees   IMPRESSION: 1. Calcified tri leaflet AV with score 2394   2.  Coronary arteries sufficient height above annulus for deployment   3. Optimum angiographic angle for deployment LAO 10 Caudal 5 degrees   4.  Membranous septal length 7.5 mm   5. Annular area of 467 mm2 suitable for a 26 mm Sapien 3 valve or a 29 mm Medtronic Evolut valve      Recent Lab Findings: Lab Results  Component Value Date   WBC 8.6 07/25/2023   HGB 8.5 (L) 08/03/2023   HCT 25.0 (L) 08/03/2023   PLT 270 07/25/2023   GLUCOSE 117 (H) 08/13/2023   CHOL 223 (H) 03/25/2019   TRIG 111 03/25/2019   HDL 89 03/25/2019   LDLCALC 115 (H) 03/25/2019   ALT 43 08/19/2022   AST 72 (H) 08/19/2022   NA 138  08/13/2023   K 5.2 08/13/2023   CL 104 08/13/2023   CREATININE 1.35 (H) 08/13/2023   BUN 36 (H) 08/13/2023   CO2 20 08/13/2023      Assessment / Plan:     73 yo female with NYHA class 2 symptoms of severe AS with normal LV function with moderate CAD. With her age and mobility issues, she would best be served with TAVR. All the risks and goals and recovery were discussed and she wishes to proceed. She would be a bail out candidate.    I have spent 60 min in review of the records, viewing studies and in face to face with patient and in coordination of future care    Eugenio Hoes 09/05/2023 3:32 PM

## 2023-09-06 ENCOUNTER — Other Ambulatory Visit (HOSPITAL_COMMUNITY): Payer: Self-pay

## 2023-09-06 ENCOUNTER — Institutional Professional Consult (permissible substitution) (INDEPENDENT_AMBULATORY_CARE_PROVIDER_SITE_OTHER): Admitting: Thoracic Surgery (Cardiothoracic Vascular Surgery)

## 2023-09-06 ENCOUNTER — Encounter: Payer: Self-pay | Admitting: Thoracic Surgery (Cardiothoracic Vascular Surgery)

## 2023-09-06 VITALS — BP 108/70 | HR 75 | Resp 18 | Ht 61.5 in | Wt 185.0 lb

## 2023-09-06 DIAGNOSIS — I350 Nonrheumatic aortic (valve) stenosis: Secondary | ICD-10-CM | POA: Diagnosis not present

## 2023-09-06 NOTE — Patient Instructions (Signed)
 TAVR

## 2023-09-07 ENCOUNTER — Other Ambulatory Visit (HOSPITAL_COMMUNITY): Payer: Self-pay

## 2023-09-07 ENCOUNTER — Encounter: Payer: Self-pay | Admitting: Cardiology

## 2023-09-07 ENCOUNTER — Other Ambulatory Visit: Payer: Self-pay

## 2023-09-07 MED ORDER — ONETOUCH VERIO VI STRP
ORAL_STRIP | 3 refills | Status: AC
  Filled 2023-09-07: qty 200, 100d supply, fill #0
  Filled 2023-09-08 – 2023-09-10 (×2): qty 200, fill #0
  Filled 2023-09-23 – 2023-10-01 (×2): qty 200, 100d supply, fill #0
  Filled 2024-01-02: qty 200, 100d supply, fill #1
  Filled 2024-04-11: qty 200, 100d supply, fill #2

## 2023-09-07 MED ORDER — ONETOUCH VERIO FLEX SYSTEM W/DEVICE KIT
PACK | 3 refills | Status: AC
  Filled 2023-09-07: qty 1, 1d supply, fill #0
  Filled 2023-09-08: qty 1, 1d supply, fill #1
  Filled 2023-09-12: qty 1, 1d supply, fill #2
  Filled 2023-09-13: qty 1, 1d supply, fill #3

## 2023-09-07 NOTE — Telephone Encounter (Signed)
 Yes. Please reschedule visit with me for sometime in June.  Thanks MJP

## 2023-09-08 ENCOUNTER — Other Ambulatory Visit (HOSPITAL_COMMUNITY): Payer: Self-pay

## 2023-09-10 ENCOUNTER — Other Ambulatory Visit: Payer: Self-pay

## 2023-09-10 ENCOUNTER — Other Ambulatory Visit (HOSPITAL_COMMUNITY): Payer: Medicare HMO

## 2023-09-10 ENCOUNTER — Other Ambulatory Visit (HOSPITAL_COMMUNITY): Payer: Self-pay

## 2023-09-10 DIAGNOSIS — I350 Nonrheumatic aortic (valve) stenosis: Secondary | ICD-10-CM

## 2023-09-14 ENCOUNTER — Other Ambulatory Visit: Payer: Self-pay

## 2023-09-14 ENCOUNTER — Ambulatory Visit (HOSPITAL_COMMUNITY)
Admission: RE | Admit: 2023-09-14 | Discharge: 2023-09-14 | Disposition: A | Discharge status: Home / Self Care | Source: Ambulatory Visit | Attending: Cardiovascular Disease | Admitting: Cardiovascular Disease

## 2023-09-14 ENCOUNTER — Encounter (HOSPITAL_COMMUNITY)
Admission: RE | Admit: 2023-09-14 | Discharge: 2023-09-14 | Disposition: A | Discharge status: Home / Self Care | Source: Ambulatory Visit | Attending: Cardiovascular Disease | Admitting: Cardiovascular Disease

## 2023-09-14 DIAGNOSIS — I350 Nonrheumatic aortic (valve) stenosis: Secondary | ICD-10-CM | POA: Insufficient documentation

## 2023-09-14 DIAGNOSIS — Z01818 Encounter for other preprocedural examination: Secondary | ICD-10-CM | POA: Diagnosis not present

## 2023-09-14 DIAGNOSIS — I700 Atherosclerosis of aorta: Secondary | ICD-10-CM | POA: Diagnosis not present

## 2023-09-14 LAB — URINALYSIS, ROUTINE W REFLEX MICROSCOPIC
Bilirubin Urine: NEGATIVE
Glucose, UA: NEGATIVE mg/dL
Ketones, ur: NEGATIVE mg/dL
Nitrite: NEGATIVE
Protein, ur: 30 mg/dL — AB
Specific Gravity, Urine: 1.004 — ABNORMAL LOW (ref 1.005–1.030)
WBC, UA: >50 WBC/hpf (ref 0–5)
pH: 6 (ref 5.0–8.0)

## 2023-09-14 LAB — TYPE AND SCREEN
ABO/RH(D): A POS
Antibody Screen: NEGATIVE

## 2023-09-14 LAB — COMPREHENSIVE METABOLIC PANEL WITH GFR
ALT: 19 U/L (ref 0–44)
AST: 25 U/L (ref 15–41)
Albumin: 3.7 g/dL (ref 3.5–5.0)
Alkaline Phosphatase: 49 U/L (ref 38–126)
Anion gap: 9 (ref 5–15)
BUN: 34 mg/dL — ABNORMAL HIGH (ref 8–23)
CO2: 22 mmol/L (ref 22–32)
Calcium: 8.9 mg/dL (ref 8.9–10.3)
Chloride: 103 mmol/L (ref 98–111)
Creatinine, Ser: 1.41 mg/dL — ABNORMAL HIGH (ref 0.44–1.00)
GFR, Estimated: 40 mL/min — ABNORMAL LOW (ref >60)
Glucose, Bld: 128 mg/dL — ABNORMAL HIGH (ref 70–99)
Potassium: 4.4 mmol/L (ref 3.5–5.1)
Sodium: 134 mmol/L — ABNORMAL LOW (ref 135–145)
Total Bilirubin: 0.5 mg/dL (ref 0.0–1.2)
Total Protein: 7.2 g/dL (ref 6.5–8.1)

## 2023-09-14 LAB — CBC
HCT: 31.3 % — ABNORMAL LOW (ref 36.0–46.0)
Hemoglobin: 10 g/dL — ABNORMAL LOW (ref 12.0–15.0)
MCH: 30.6 pg (ref 26.0–34.0)
MCHC: 31.9 g/dL (ref 30.0–36.0)
MCV: 95.7 fL (ref 80.0–100.0)
Platelets: 292 10*3/uL (ref 150–400)
RBC: 3.27 MIL/uL — ABNORMAL LOW (ref 3.87–5.11)
RDW: 13.2 % (ref 11.5–15.5)
WBC: 12.2 10*3/uL — ABNORMAL HIGH (ref 4.0–10.5)
nRBC: 0 % (ref 0.0–0.2)

## 2023-09-14 LAB — PROTIME-INR
INR: 1 (ref 0.8–1.2)
Prothrombin Time: 13.6 s (ref 11.4–15.2)

## 2023-09-14 LAB — SURGICAL PCR SCREEN
MRSA, PCR: NEGATIVE
Staphylococcus aureus: NEGATIVE

## 2023-09-14 NOTE — Progress Notes (Signed)
 Patient signed all consents at PAT lab appointment. CHG soap and instructions were given to patient. CHG surgical prep reviewed with patient and all questions answered.  Patients chart send to anesthesia for review. Pt denies any respiratory illness/infection in the last two months.

## 2023-09-14 NOTE — Progress Notes (Signed)
 Lauren, RN, will review all PAT lab results.

## 2023-09-17 MED ORDER — HEPARIN 30,000 UNITS/1000 ML (OHS) CELLSAVER SOLUTION
Status: DC
  Filled 2023-09-17 (×2): qty 1000

## 2023-09-17 MED ORDER — POTASSIUM CHLORIDE 2 MEQ/ML IV SOLN
80.0000 meq | INTRAVENOUS | Status: DC
  Filled 2023-09-17 (×2): qty 40

## 2023-09-17 MED ORDER — NOREPINEPHRINE 4 MG/250ML-% IV SOLN
0.0000 ug/min | INTRAVENOUS | Status: DC
  Filled 2023-09-17: qty 250

## 2023-09-17 MED ORDER — DEXMEDETOMIDINE HCL IN NACL 400 MCG/100ML IV SOLN
0.1000 ug/kg/h | INTRAVENOUS | Status: AC
  Administered 2023-09-18: 83.92 ug via INTRAVENOUS
  Administered 2023-09-18: 1 ug/kg/h via INTRAVENOUS
  Filled 2023-09-17: qty 100

## 2023-09-17 MED ORDER — MAGNESIUM SULFATE 50 % IJ SOLN
40.0000 meq | INTRAMUSCULAR | Status: DC
  Filled 2023-09-17 (×2): qty 9.85

## 2023-09-17 MED ORDER — CEFAZOLIN SODIUM-DEXTROSE 2-4 GM/100ML-% IV SOLN
2.0000 g | INTRAVENOUS | Status: AC
  Administered 2023-09-18: 2 g via INTRAVENOUS
  Filled 2023-09-17: qty 100

## 2023-09-17 NOTE — Anesthesia Preprocedure Evaluation (Signed)
 Anesthesia Evaluation  Patient identified by MRN, date of birth, ID band Patient awake    Reviewed: Allergy & Precautions, H&P , NPO status , Patient's Chart, lab work & pertinent test results  Airway Mallampati: II  TM Distance: >3 FB Neck ROM: Full    Dental no notable dental hx.    Pulmonary neg pulmonary ROS   Pulmonary exam normal breath sounds clear to auscultation       Cardiovascular hypertension, +CHF  Normal cardiovascular exam+ dysrhythmias Atrial Fibrillation  Rhythm:Regular Rate:Normal  06/2023 TTE IMPRESSIONS     1. Left ventricular ejection fraction, by estimation, is 55 to 60%. Left  ventricular ejection fraction by 3D volume is 56 %. The left ventricle has  normal function. The left ventricle has no regional wall motion  abnormalities. There is mild left  ventricular hypertrophy. Left ventricular diastolic parameters are  consistent with Grade II diastolic dysfunction (pseudonormalization).  Elevated left atrial pressure. The average left ventricular global  longitudinal strain is -18.0 %. The global  longitudinal strain is normal.   2. Right ventricular systolic function is normal. The right ventricular  size is normal. There is normal pulmonary artery systolic pressure. The  estimated right ventricular systolic pressure is 34.6 mmHg.   3. The mitral valve is degenerative. Mild to moderate mitral valve  regurgitation.   4. The inferior vena cava is normal in size with greater than 50%  respiratory variability, suggesting right atrial pressure of 3 mmHg.   5. The aortic valve is calcified. There is severe calcifcation of the  aortic valve. Aortic valve regurgitation is trivial. Severe aortic valve  stenosis. Moderate AS by gradients (Vmax 3.5 m/s, MG ), severe by  AVA (0.6 cm^2) and DI (0.19). Low SV  index (28 cc/m^2). Consistent with paradoxical low flow low gradient  severe AS      Neuro/Psych negative neurological ROS  negative psych ROS   GI/Hepatic negative GI ROS, Neg liver ROS,,,  Endo/Other  negative endocrine ROSdiabetes, Type 2    Renal/GU CRFRenal disease  negative genitourinary   Musculoskeletal negative musculoskeletal ROS (+)    Abdominal   Peds negative pediatric ROS (+)  Hematology  (+) Blood dyscrasia, anemia   Anesthesia Other Findings   Reproductive/Obstetrics negative OB ROS                              Anesthesia Physical Anesthesia Plan  ASA: 4  Anesthesia Plan: MAC   Post-op Pain Management:    Induction: Intravenous  PONV Risk Score and Plan: 1 and Treatment may vary due to age or medical condition, Ondansetron  and TIVA  Airway Management Planned: Natural Airway  Additional Equipment:   Intra-op Plan:   Post-operative Plan:   Informed Consent: I have reviewed the patients History and Physical, chart, labs and discussed the procedure including the risks, benefits and alternatives for the proposed anesthesia with the patient or authorized representative who has indicated his/her understanding and acceptance.     Dental advisory given  Plan Discussed with: CRNA  Anesthesia Plan Comments:         Anesthesia Quick Evaluation

## 2023-09-17 NOTE — H&P (Signed)
 [address]       [place] [address]             [phone]                                   Danielle Beck [place] Medical Record #[id number] Date of Birth: 1950/10/08   Danielle Phy, MD Elida Grounds, DO   Chief Complaint:   Increasing DOE    History of Present Illness:     Pt is a 73 yo female with increasing DOE, chest pressure who has been followed over the past several years for AS. Recent TTE with EF of 55% and mean gradient of 31 mmHg with a AVA of 0.56cm2 and a DVI of 0.2. Pt was felt secondary to her symptoms and severity of the AS to be candidate for AVR and underwent cath with moderate CAD and TAVR CTA with femoral access for TAVR and and sized to a 26mm Sapien valve. She has had back issues and has had mobility issues int the past             Past Medical History:  Diagnosis Date   CHF (congestive heart failure) (HCC)     Diabetes mellitus without complication (HCC)     Hyperlipidemia     Hypertension     Severe aortic stenosis                 Past Surgical History:  Procedure Laterality Date   CATARACT EXTRACTION       RIGHT/LEFT HEART CATH AND CORONARY ANGIOGRAPHY N/A 08/03/2023    Procedure: RIGHT/LEFT HEART CATH AND CORONARY ANGIOGRAPHY;  Surgeon: Cody Das, MD;  Location: [facility];  Service: Cardiovascular;  Laterality: N/A;   TUBAL LIGATION   1985          Tobacco Use History  Social History       Tobacco Use  Smoking Status Never  Smokeless Tobacco Never      Social History       Substance and Sexual Activity  Alcohol  Use Not Currently      Social History         Socioeconomic History   Marital status: Married      Spouse name: Not on file   Number of children: 3   Years of education: Not on file   Highest education level: Not on file  Occupational History   Not on file  Tobacco Use   Smoking status: Never   Smokeless tobacco: Never  Vaping Use   Vaping status:  Never Used  Substance and Sexual Activity   Alcohol  use: Not Currently   Drug use: Not Currently   Sexual activity: Not on file  Other Topics Concern   Not on file  Social History Narrative   Not on file    Social Drivers of Health    Financial Resource Strain: Not on file  Food Insecurity: Not on file  Transportation Needs: Not on file  Physical Activity: Not on file  Stress: Not on file  Social Connections: Not on file  Intimate Partner Violence: Not on file      Allergies  No Known Allergies           Current Outpatient Medications  Medication Sig Dispense Refill   Alcohol  Swabs  (ALCOHOL  PREP) 70 % PADS Use as  directed aily 100 each 0   amLODipine  (NORVASC ) 10 MG tablet Take 1 tablet (10 mg total) by mouth daily. 100 tablet 1   atorvastatin  (LIPITOR) 10 MG tablet Take 2 tablets (20 mg total) by mouth daily for cholesterol. 100 tablet 1   Blood Glucose Monitoring Suppl (ONE TOUCH ULTRA 2) w/Device KIT Use to check blood sugar as directed 1 kit 1   Calcium  Carb-Cholecalciferol (CALCIUM  600 + D PO) Take 1 tablet by mouth 2 (two) times daily.       Cholecalciferol (VITAMIN D3) 1000 units CAPS Take 1,000 Units by mouth daily.       cyanocobalamin (VITAMIN B12) 1000 MCG tablet Take 1,000 mcg by mouth daily.       dorzolamide  (TRUSOPT ) 2 % ophthalmic solution Place 1 drop into the left eye 2 (two) times daily.       Ferrous Sulfate  (IRON) 325 (65 Fe) MG TABS Take 325 mg by mouth 2 (two) times daily.       glucose blood (ONETOUCH ULTRA TEST) test strip Use as directed to check blood sugar daily. 100 each 3   insulin  glargine (LANTUS  SOLOSTAR) 100 UNIT/ML Solostar Pen Inject 34 Units into the skin daily. 9 mL 5   Insulin  Pen Needle (DROPLET PEN NEEDLES) 32G X 6 MM MISC Use to inject insulin  once daily. 100 each 0   Lancets (ONETOUCH DELICA PLUS LANCET33G) MISC Use to check blood sugar daily. 100 each 3   latanoprost  (XALATAN ) 0.005 % ophthalmic solution Place 1 drop into both eyes  at bedtime.        losartan  (COZAAR ) 50 MG tablet Take 1 tablet (50 mg total) by mouth daily for blood pressure. 100 tablet 1   metFORMIN  (GLUCOPHAGE ) 1000 MG tablet Take 1 tablet (1,000 mg total) by mouth 2 (two) times daily with a meal. 200 tablet 1   methocarbamol  (ROBAXIN ) 500 MG tablet Take 2 tablets (1,000 mg total) by mouth daily as needed for muscle spasms. (Patient taking differently: Take 1,000 mg by mouth daily.) 60 tablet 11   metoprolol  tartrate (LOPRESSOR ) 25 MG tablet Take 1 tablet (25 mg total) by mouth 2 (two) times daily. 180 tablet 3   nitroGLYCERIN  (NITROSTAT ) 0.4 MG SL tablet Place 1 tablet (0.4 mg total) under the tongue every 5 (five) minutes as needed for chest pain. 30 tablet 3   pregabalin  (LYRICA ) 75 MG capsule Take 1 capsule (75 mg total) by mouth 2 (two) times daily. (Patient taking differently: Take 150 mg by mouth daily.) 60 capsule 5   pyridOXINE (VITAMIN B-6) 100 MG tablet Take 100 mg by mouth daily.       Thiamine HCl (VITAMIN B-1) 250 MG tablet Take 250 mg by mouth daily.       timolol  (TIMOPTIC ) 0.5 % ophthalmic solution Place 1 drop into the left eye 2 (two) times daily.       torsemide  (DEMADEX ) 20 MG tablet Take 2 tablets (40 mg total) by mouth 2 (two) times daily. (Patient taking differently: Take 20-40 mg by mouth See admin instructions. Take 40 mg in the morning and 20-40 mg in the afternoon) 360 tablet 0   traMADol  (ULTRAM ) 50 MG tablet Take 1 tablet (50 mg total) by mouth every 6 (six) hours as needed for moderate pain 10 tablet 5   XARELTO  15 MG TABS tablet TAKE 1 TABLET EVERY DAY WITH SUPPER 90 tablet 3      No current facility-administered medications for this visit.  Family History  Problem Relation Age of Onset   Diabetes Mother     Heart failure Father     Diabetes Brother     Diabetes Brother     Diabetes Brother     Hypertension Brother     Breast cancer Neg Hx                  Physical Exam: Teeth in good  repair Lungs; clear Card: RR with high pitched murmur Ext: no edema Neuro: intact         Diagnostic Studies & Laboratory data: I have personally reviewed the following studies and agree with the findings   TTE (06/2023) IMPRESSIONS     1. Left ventricular ejection fraction, by estimation, is 55 to 60%. Left  ventricular ejection fraction by 3D volume is 56 %. The left ventricle has  normal function. The left ventricle has no regional wall motion  abnormalities. There is mild left  ventricular hypertrophy. Left ventricular diastolic parameters are  consistent with Grade II diastolic dysfunction (pseudonormalization).  Elevated left atrial pressure. The average left ventricular global  longitudinal strain is -18.0 %. The global  longitudinal strain is normal.   2. Right ventricular systolic function is normal. The right ventricular  size is normal. There is normal pulmonary artery systolic pressure. The  estimated right ventricular systolic pressure is 34.6 mmHg.   3. The mitral valve is degenerative. Mild to moderate mitral valve  regurgitation.   4. The inferior vena cava is normal in size with greater than 50%  respiratory variability, suggesting right atrial pressure of 3 mmHg.   5. The aortic valve is calcified. There is severe calcifcation of the  aortic valve. Aortic valve regurgitation is trivial. Severe aortic valve  stenosis. Moderate AS by gradients (Vmax 3.5 m/s, MG ), severe by  AVA (0.6 cm^2) and DI (0.19). Low SV  index (28 cc/m^2). Consistent with paradoxical low flow low gradient  severe AS   FINDINGS   Left Ventricle: Left ventricular ejection fraction, by estimation, is 55  to 60%. Left ventricular ejection fraction by 3D volume is 56 %. The left  ventricle has normal function. The left ventricle has no regional wall  motion abnormalities. The average  left ventricular global longitudinal strain is -18.0 %. Strain was  performed and the global  longitudinal strain is normal. The left  ventricular internal cavity size was normal in size. There is mild left  ventricular hypertrophy. Left ventricular  diastolic parameters are consistent with Grade II diastolic dysfunction  (pseudonormalization). Elevated left atrial pressure.   Right Ventricle: The right ventricular size is normal. No increase in  right ventricular wall thickness. Right ventricular systolic function is  normal. There is normal pulmonary artery systolic pressure. The tricuspid  regurgitant velocity is 2.81 m/s, and   with an assumed right atrial pressure of 3 mmHg, the estimated right  ventricular systolic pressure is 34.6 mmHg.   Left Atrium: Left atrial size was normal in size.   Right Atrium: Right atrial size was normal in size.   Pericardium: There is no evidence of pericardial effusion.   Mitral Valve: The mitral valve is degenerative in appearance. Mild to  moderate mitral valve regurgitation.   Tricuspid Valve: The tricuspid valve is normal in structure. Tricuspid  valve regurgitation is trivial.   Aortic Valve: The aortic valve is calcified. There is severe calcifcation  of the aortic valve. Aortic valve regurgitation is trivial. Aortic  regurgitation PHT measures 401  msec. Severe aortic stenosis is present.  Aortic valve mean gradient measures 31.0  mmHg. Aortic valve peak gradient measures 49.3 mmHg. Aortic valve area, by  VTI measures 0.56 cm.   Pulmonic Valve: The pulmonic valve was grossly normal. Pulmonic valve  regurgitation is trivial.   Aorta: The aortic root and ascending aorta are structurally normal, with  no evidence of dilitation.   Venous: The inferior vena cava is normal in size with greater than 50%  respiratory variability, suggesting right atrial pressure of 3 mmHg.   IAS/Shunts: The interatrial septum was not well visualized.   Additional Comments: 3D was performed not requiring image post processing  on an independent  workstation and was normal.     LEFT VENTRICLE  PLAX 2D  LVIDd:         4.80 cm         Diastology  LVIDs:         3.30 cm         LV e' medial:    5.98 cm/s  LV PW:         1.20 cm         LV E/e' medial:  20.7  LV IVS:        1.00 cm         LV e' lateral:   11.90 cm/s  LVOT diam:     1.90 cm         LV E/e' lateral: 10.4  LV SV:         52  LV SV Index:   28              2D  LVOT Area:     2.84 cm        Longitudinal                                 Strain                                 2D Strain GLS  -18.0 %                                 Avg:                                   3D Volume EF                                 LV 3D EF:    Left                                              ventricul                                              ar  ejection                                              fraction                                              by 3D                                              volume is                                              56 %.                                   3D Volume EF:                                 3D EF:        56 %                                 LV EDV:       105 ml                                 LV ESV:       46 ml                                 LV SV:        59 ml   RIGHT VENTRICLE  RV Basal diam:  2.70 cm  RV S prime:     11.30 cm/s  TAPSE (M-mode): 2.2 cm  RVSP:           34.6 mmHg   LEFT ATRIUM             Index        RIGHT ATRIUM           Index  LA diam:        4.70 cm 2.52 cm/m   RA Pressure: 3.00 mmHg  LA Vol (A2C):   65.4 ml 35.04 ml/m  RA Area:     10.70 cm  LA Vol (A4C):   50.6 ml 27.11 ml/m  RA Volume:   21.30 ml  11.41 ml/m  LA Biplane Vol: 59.5 ml 31.88 ml/m   AORTIC VALVE  AV Area (Vmax):    0.70 cm  AV Area (Vmean):   0.60 cm  AV Area (VTI):     0.56 cm  AV Vmax:           351.00 cm/s  AV Vmean:          273.000 cm/s  AV VTI:            0.931 m  AV  Peak Grad:      49.3 mmHg  AV Mean Grad:      31.0 mmHg  LVOT Vmax:         86.50 cm/s  LVOT Vmean:        57.600 cm/s  LVOT VTI:          0.184 m  LVOT/AV VTI ratio: 0.20  AI PHT:            401 msec    AORTA  Ao Root diam: 3.40 cm  Ao Asc diam:  3.40 cm   MITRAL VALVE                  TRICUSPID VALVE                                TR Peak grad:   31.6 mmHg  MV Decel Time:                TR Vmax:        281.00 cm/s  MR Peak grad:    115.3 mmHg   Estimated RAP:  3.00 mmHg  MR Mean grad:    55.0 mmHg    RVSP:           34.6 mmHg  MR Vmax:         537.00 cm/s  MR Vmean:        334.0 cm/s   SHUNTS  MR PISA:         4.02 cm     Systemic VTI:  0.18 m  MR PISA Eff ROA: 23 mm       Systemic Diam: 1.90 cm  MR PISA Radius:  0.80 cm  MV E velocity: 124.00 cm/s  MV A velocity: 113.00 cm/s  MV E/A ratio:  1.10    CATH (07/2023) Conclusion   Coronary angiography 08/03/2023: LM: Normal LAD: Mild calcification          Prox-mid 30% disease Lcx: Large vessel        50% mid stenosis between ectatic segment on either side RCA: Mild calcification with mild diffuse disease      Right heart catheterization 08/03/2023: RA: 9 mmHg RV: 44/4 mmHg PA: 42/20 mmHg, mPAP 36 mmHg PCW: 24 mmHg   AO sats: 92% PA sats: 57%   CO: 5.0 L/min CI: 2.7 L/min/m2   Two vessel nonobstructive CAD No proximal CAD Moderate PH, WHO Grp II Mildly elevated filling pressures    Recent Radiology Findings:   CTA (07/2023) FINDINGS: Aortic Valve: Calcified tri leaflet AV with score 2394   Aorta: Normal arch vessels mild calcific atherosclerosis no aneurysm   Sino-tubular Junction: 27.3 mm   Ascending Thoracic Aorta: 32 mm   Aortic Arch: 26 mm   Descending Thoracic Aorta: 21 mm   Sinus of Valsalva Measurements:   Non-coronary: 30.8 mm  Height 19.2 mm   Right - coronary: 28.1 mm  Height 18.9 mm   Left -   coronary: 30 mm  Height 19.1 mm   Coronary Artery Height above Annulus:   Left Main: 14.8  mm above annulus   Right Coronary: 15.4 mm above annulus   Virtual Basal Annulus Measurements:   Maximum / Minimum Diameter: 27.4 mm x 21.4 mm Average diameter 24.4 mm   Perimeter: 78 mm   Area:  467 mm2   Coronary Arteries: Sufficient height above annulus for deployment   Optimum Fluoroscopic Angle for Delivery: LAO 10 Caudal 5 degrees   IMPRESSION: 1. Calcified tri leaflet AV with score 2394   2.  Coronary arteries sufficient height above annulus for deployment   3. Optimum angiographic angle for deployment LAO 10 Caudal 5 degrees   4.  Membranous septal length 7.5 mm   5. Annular area of 467 mm2 suitable for a 26 mm Sapien 3 valve or a 29 mm Medtronic Evolut valve       Recent Lab Findings: Recent Labs       Lab Results  Component Value Date    WBC 8.6 07/25/2023    HGB 8.5 (L) 08/03/2023    HCT 25.0 (L) 08/03/2023    PLT 270 07/25/2023    GLUCOSE 117 (H) 08/13/2023    CHOL 223 (H) 03/25/2019    TRIG 111 03/25/2019    HDL 89 03/25/2019    LDLCALC 115 (H) 03/25/2019    ALT 43 08/19/2022    AST 72 (H) 08/19/2022    NA 138 08/13/2023    K 5.2 08/13/2023    CL 104 08/13/2023    CREATININE 1.35 (H) 08/13/2023    BUN 36 (H) 08/13/2023    CO2 20 08/13/2023            Assessment / Plan:     73 yo female with NYHA class 2 symptoms of severe AS with normal LV function with moderate CAD. With her age and mobility issues, she would best be served with TAVR. All the risks and goals and recovery were discussed and she wishes to proceed. She would be a bail out candidate.

## 2023-09-18 ENCOUNTER — Encounter (HOSPITAL_COMMUNITY): Payer: Self-pay | Admitting: Cardiovascular Disease

## 2023-09-18 ENCOUNTER — Other Ambulatory Visit: Payer: Self-pay

## 2023-09-18 ENCOUNTER — Inpatient Hospital Stay (HOSPITAL_COMMUNITY)

## 2023-09-18 ENCOUNTER — Other Ambulatory Visit: Payer: Self-pay | Admitting: Physician Assistant

## 2023-09-18 ENCOUNTER — Inpatient Hospital Stay (HOSPITAL_COMMUNITY): Admitting: Physician Assistant

## 2023-09-18 ENCOUNTER — Inpatient Hospital Stay (HOSPITAL_COMMUNITY)
Admission: RE | Admit: 2023-09-18 | Discharge: 2023-09-19 | DRG: 267 | Disposition: A | Discharge status: Home / Self Care | Attending: Thoracic Surgery (Cardiothoracic Vascular Surgery) | Admitting: Thoracic Surgery (Cardiothoracic Vascular Surgery)

## 2023-09-18 ENCOUNTER — Encounter (HOSPITAL_COMMUNITY)
Admission: RE | Disposition: A | Discharge status: Home / Self Care | Payer: Self-pay | Source: Home / Self Care | Attending: Thoracic Surgery (Cardiothoracic Vascular Surgery)

## 2023-09-18 DIAGNOSIS — E782 Mixed hyperlipidemia: Secondary | ICD-10-CM | POA: Diagnosis not present

## 2023-09-18 DIAGNOSIS — Z6833 Body mass index (BMI) 33.0-33.9, adult: Secondary | ICD-10-CM | POA: Diagnosis not present

## 2023-09-18 DIAGNOSIS — Z7984 Long term (current) use of oral hypoglycemic drugs: Secondary | ICD-10-CM | POA: Diagnosis not present

## 2023-09-18 DIAGNOSIS — I10 Essential (primary) hypertension: Secondary | ICD-10-CM | POA: Diagnosis present

## 2023-09-18 DIAGNOSIS — I5032 Chronic diastolic (congestive) heart failure: Secondary | ICD-10-CM | POA: Diagnosis not present

## 2023-09-18 DIAGNOSIS — I110 Hypertensive heart disease with heart failure: Secondary | ICD-10-CM | POA: Diagnosis not present

## 2023-09-18 DIAGNOSIS — Z79899 Other long term (current) drug therapy: Secondary | ICD-10-CM | POA: Diagnosis not present

## 2023-09-18 DIAGNOSIS — Z952 Presence of prosthetic heart valve: Secondary | ICD-10-CM | POA: Diagnosis not present

## 2023-09-18 DIAGNOSIS — I2510 Atherosclerotic heart disease of native coronary artery without angina pectoris: Secondary | ICD-10-CM | POA: Diagnosis present

## 2023-09-18 DIAGNOSIS — E1122 Type 2 diabetes mellitus with diabetic chronic kidney disease: Secondary | ICD-10-CM | POA: Diagnosis present

## 2023-09-18 DIAGNOSIS — I480 Paroxysmal atrial fibrillation: Secondary | ICD-10-CM | POA: Diagnosis present

## 2023-09-18 DIAGNOSIS — Z794 Long term (current) use of insulin: Secondary | ICD-10-CM

## 2023-09-18 DIAGNOSIS — E1169 Type 2 diabetes mellitus with other specified complication: Secondary | ICD-10-CM | POA: Diagnosis present

## 2023-09-18 DIAGNOSIS — I350 Nonrheumatic aortic (valve) stenosis: Secondary | ICD-10-CM

## 2023-09-18 DIAGNOSIS — Z006 Encounter for examination for normal comparison and control in clinical research program: Secondary | ICD-10-CM

## 2023-09-18 DIAGNOSIS — I130 Hypertensive heart and chronic kidney disease with heart failure and stage 1 through stage 4 chronic kidney disease, or unspecified chronic kidney disease: Secondary | ICD-10-CM | POA: Diagnosis present

## 2023-09-18 DIAGNOSIS — Z954 Presence of other heart-valve replacement: Secondary | ICD-10-CM | POA: Diagnosis not present

## 2023-09-18 DIAGNOSIS — E66811 Obesity, class 1: Secondary | ICD-10-CM | POA: Diagnosis not present

## 2023-09-18 DIAGNOSIS — Z8249 Family history of ischemic heart disease and other diseases of the circulatory system: Secondary | ICD-10-CM

## 2023-09-18 DIAGNOSIS — N1832 Chronic kidney disease, stage 3b: Secondary | ICD-10-CM | POA: Diagnosis present

## 2023-09-18 DIAGNOSIS — Z7901 Long term (current) use of anticoagulants: Secondary | ICD-10-CM

## 2023-09-18 DIAGNOSIS — Z833 Family history of diabetes mellitus: Secondary | ICD-10-CM | POA: Diagnosis not present

## 2023-09-18 DIAGNOSIS — E785 Hyperlipidemia, unspecified: Secondary | ICD-10-CM | POA: Diagnosis present

## 2023-09-18 HISTORY — PX: INTRAOPERATIVE TRANSTHORACIC ECHOCARDIOGRAM: SHX6523

## 2023-09-18 LAB — POCT I-STAT, CHEM 8
BUN: 29 mg/dL — ABNORMAL HIGH (ref 8–23)
Calcium, Ion: 1.3 mmol/L (ref 1.15–1.40)
Chloride: 114 mmol/L — ABNORMAL HIGH (ref 98–111)
Creatinine, Ser: 1.4 mg/dL — ABNORMAL HIGH (ref 0.44–1.00)
Glucose, Bld: 128 mg/dL — ABNORMAL HIGH (ref 70–99)
HCT: 26 % — ABNORMAL LOW (ref 36.0–46.0)
Hemoglobin: 8.8 g/dL — ABNORMAL LOW (ref 12.0–15.0)
Potassium: 4 mmol/L (ref 3.5–5.1)
Sodium: 143 mmol/L (ref 135–145)
TCO2: 19 mmol/L — ABNORMAL LOW (ref 22–32)

## 2023-09-18 LAB — GLUCOSE, CAPILLARY
Glucose-Capillary: 121 mg/dL — ABNORMAL HIGH (ref 70–99)
Glucose-Capillary: 119 mg/dL — ABNORMAL HIGH (ref 70–99)

## 2023-09-18 LAB — ECHOCARDIOGRAM LIMITED
AR max vel: 1.11 cm2
AV Area VTI: 1.32 cm2
AV Area mean vel: 1.34 cm2
AV Mean grad: 12.3 mmHg
AV Peak grad: 20.9 mmHg
Ao pk vel: 2.29 m/s

## 2023-09-18 LAB — ABO/RH: ABO/RH(D): A POS

## 2023-09-18 SURGERY — TRANSCATHETER AORTIC VALVE REPLACEMENT, TRANSFEMORAL (CATHLAB)
Anesthesia: Monitor Anesthesia Care | Laterality: Right

## 2023-09-18 MED ORDER — ONDANSETRON HCL 4 MG/2ML IJ SOLN: 4.0000 mg | Freq: Four times a day (QID) | INTRAMUSCULAR | Status: DC | PRN

## 2023-09-18 MED ORDER — FERROUS SULFATE 325 (65 FE) MG PO TABS
325.0000 mg | ORAL_TABLET | Freq: Two times a day (BID) | ORAL | Status: DC
Start: 2023-09-18 — End: 2023-09-19
  Administered 2023-09-18 – 2023-09-19 (×2): 325 mg via ORAL
  Filled 2023-09-18 (×2): qty 1

## 2023-09-18 MED ORDER — MORPHINE SULFATE (PF) 2 MG/ML IV SOLN: 1.0000 mg | INTRAVENOUS | Status: DC | PRN

## 2023-09-18 MED ORDER — CHLORHEXIDINE GLUCONATE 4 % EX SOLN: 60.0000 mL | Freq: Once | CUTANEOUS | Status: DC

## 2023-09-18 MED ORDER — CHLORHEXIDINE GLUCONATE 4 % EX SOLN
60.0000 mL | Freq: Once | CUTANEOUS | Status: DC
Start: 2023-09-19 — End: 2023-09-18

## 2023-09-18 MED ORDER — CHLORHEXIDINE GLUCONATE 0.12 % MT SOLN
15.0000 mL | Freq: Once | OROMUCOSAL | Status: AC
  Administered 2023-09-18: 15 mL via OROMUCOSAL
  Filled 2023-09-18: qty 15

## 2023-09-18 MED ORDER — NITROGLYCERIN IN D5W 200-5 MCG/ML-% IV SOLN
0.0000 ug/min | INTRAVENOUS | Status: DC
Start: 2023-09-18 — End: 2023-09-18

## 2023-09-18 MED ORDER — HEPARIN SODIUM (PORCINE) 1000 UNIT/ML IJ SOLN
INTRAMUSCULAR | Status: DC | PRN
Start: 2023-09-18 — End: 2023-09-18
  Administered 2023-09-18: 12000 [IU] via INTRAVENOUS

## 2023-09-18 MED ORDER — PROTAMINE SULFATE 10 MG/ML IV SOLN
INTRAVENOUS | Status: DC | PRN
  Administered 2023-09-18: 20 mg via INTRAVENOUS
  Administered 2023-09-18: 100 mg via INTRAVENOUS

## 2023-09-18 MED ORDER — IOHEXOL 350 MG/ML SOLN
INTRAVENOUS | Status: DC | PRN
  Administered 2023-09-18: 50 mL

## 2023-09-18 MED ORDER — SODIUM CHLORIDE 0.9% FLUSH
3.0000 mL | Freq: Two times a day (BID) | INTRAVENOUS | Status: DC
  Administered 2023-09-19: 3 mL via INTRAVENOUS

## 2023-09-18 MED ORDER — SODIUM CHLORIDE 0.9 % IV SOLN
INTRAVENOUS | Status: DC
Start: 2023-09-19 — End: 2023-09-18

## 2023-09-18 MED ORDER — SODIUM CHLORIDE 0.9% FLUSH
3.0000 mL | INTRAVENOUS | Status: DC | PRN
  Administered 2023-09-18: 3 mL via INTRAVENOUS

## 2023-09-18 MED ORDER — LACTATED RINGERS IV SOLN: INTRAVENOUS | Status: DC | PRN

## 2023-09-18 MED ORDER — INSULIN ASPART 100 UNIT/ML IJ SOLN
0.0000 [IU] | INTRAMUSCULAR | Status: DC | PRN
Start: 2023-09-18 — End: 2023-09-18

## 2023-09-18 MED ORDER — LIDOCAINE HCL (PF) 1 % IJ SOLN
INTRAMUSCULAR | Status: DC | PRN
  Administered 2023-09-18 (×2): 10 mL

## 2023-09-18 MED ORDER — MIDAZOLAM HCL 2 MG/2ML IJ SOLN
INTRAMUSCULAR | Status: DC | PRN
Start: 2023-09-18 — End: 2023-09-18
  Administered 2023-09-18 (×2): .5 mg via INTRAVENOUS

## 2023-09-18 MED ORDER — ACETAMINOPHEN 325 MG PO TABS
650.0000 mg | ORAL_TABLET | Freq: Four times a day (QID) | ORAL | Status: DC | PRN
  Administered 2023-09-18 – 2023-09-19 (×2): 650 mg via ORAL
  Filled 2023-09-18 (×2): qty 2

## 2023-09-18 MED ORDER — ACETAMINOPHEN 650 MG RE SUPP: 650.0000 mg | Freq: Four times a day (QID) | RECTAL | Status: DC | PRN

## 2023-09-18 MED ORDER — TRAMADOL HCL 50 MG PO TABS: 50.0000 mg | ORAL_TABLET | ORAL | Status: DC | PRN

## 2023-09-18 MED ORDER — SODIUM CHLORIDE 0.9 % IV SOLN: INTRAVENOUS | Status: AC

## 2023-09-18 MED ORDER — PHENYLEPHRINE HCL-NACL 20-0.9 MG/250ML-% IV SOLN
INTRAVENOUS | Status: DC | PRN
  Administered 2023-09-18: 80 ug via INTRAVENOUS
  Administered 2023-09-18: 40 ug/min via INTRAVENOUS
  Administered 2023-09-18: 80 ug via INTRAVENOUS

## 2023-09-18 MED ORDER — LIDOCAINE HCL (PF) 1 % IJ SOLN
INTRAMUSCULAR | Status: AC
  Filled 2023-09-18: qty 30

## 2023-09-18 MED ORDER — SODIUM CHLORIDE 0.9 % IV SOLN: 250.0000 mL | INTRAVENOUS | Status: DC | PRN

## 2023-09-18 MED ORDER — METHOCARBAMOL 500 MG PO TABS
1000.0000 mg | ORAL_TABLET | Freq: Every day | ORAL | Status: DC
  Administered 2023-09-18 – 2023-09-19 (×2): 1000 mg via ORAL
  Filled 2023-09-18 (×2): qty 2

## 2023-09-18 MED ORDER — NOREPINEPHRINE 4 MG/250ML-% IV SOLN: 0.0000 ug/min | INTRAVENOUS | Status: DC

## 2023-09-18 MED ORDER — CEFAZOLIN SODIUM-DEXTROSE 2-4 GM/100ML-% IV SOLN
2.0000 g | Freq: Three times a day (TID) | INTRAVENOUS | Status: AC
  Administered 2023-09-18 – 2023-09-19 (×2): 2 g via INTRAVENOUS
  Filled 2023-09-18 (×2): qty 100

## 2023-09-18 MED ORDER — MIDAZOLAM HCL 2 MG/2ML IJ SOLN
INTRAMUSCULAR | Status: AC
  Filled 2023-09-18: qty 2

## 2023-09-18 MED ORDER — HEPARIN (PORCINE) IN NACL 1000-0.9 UT/500ML-% IV SOLN
INTRAVENOUS | Status: DC | PRN
  Administered 2023-09-18 (×2): 500 mL

## 2023-09-18 MED ORDER — PROPOFOL 10 MG/ML IV BOLUS
INTRAVENOUS | Status: DC | PRN
  Administered 2023-09-18 (×2): 20 mg via INTRAVENOUS

## 2023-09-18 MED ORDER — ATORVASTATIN CALCIUM 10 MG PO TABS
20.0000 mg | ORAL_TABLET | Freq: Every day | ORAL | Status: DC
Start: 2023-09-18 — End: 2023-09-19
  Administered 2023-09-18 – 2023-09-19 (×2): 20 mg via ORAL
  Filled 2023-09-18 (×2): qty 2

## 2023-09-18 MED ORDER — PREGABALIN 25 MG PO CAPS
150.0000 mg | ORAL_CAPSULE | Freq: Every day | ORAL | Status: DC
  Administered 2023-09-18 – 2023-09-19 (×2): 150 mg via ORAL
  Filled 2023-09-18 (×2): qty 2

## 2023-09-18 MED ORDER — OXYCODONE HCL 5 MG PO TABS: 5.0000 mg | ORAL_TABLET | ORAL | Status: DC | PRN

## 2023-09-18 MED ORDER — CHLORHEXIDINE GLUCONATE 4 % EX SOLN: 30.0000 mL | CUTANEOUS | Status: DC

## 2023-09-18 MED ORDER — HEPARIN (PORCINE) IN NACL 2000-0.9 UNIT/L-% IV SOLN
INTRAVENOUS | Status: DC | PRN
  Administered 2023-09-18: 1000 mL

## 2023-09-18 SURGICAL SUPPLY — 29 items
BAG SNAP BAND KOVER 36X36 (MISCELLANEOUS) ×4 IMPLANT
CABLE ADAPT PACING TEMP 12FT (ADAPTER) IMPLANT
CATH 26 ULTRA DELIVERY (CATHETERS) IMPLANT
CATH DIAG 6FR PIGTAIL ANGLED (CATHETERS) IMPLANT
CATH INFINITI 5FR ANG PIGTAIL (CATHETERS) IMPLANT
CATH INFINITI 6F AL1 (CATHETERS) IMPLANT
CATH S G BIP PACING (CATHETERS) IMPLANT
CLOSURE MYNX CONTROL 6F/7F (Vascular Products) IMPLANT
CLOSURE PERCLOSE PROSTYLE (VASCULAR PRODUCTS) IMPLANT
CRIMPER (MISCELLANEOUS) IMPLANT
DEVICE INFLATION ATRION QL2530 (MISCELLANEOUS) IMPLANT
ELECT DEFIB PAD ADLT CADENCE (PAD) IMPLANT
KIT MICROPUNCTURE NIT STIFF (SHEATH) IMPLANT
KIT SAPIAN 3 ULTRA RESILIA 26 (Valve) IMPLANT
PACK CARDIAC CATHETERIZATION (CUSTOM PROCEDURE TRAY) ×2 IMPLANT
SET ATX-X65L (MISCELLANEOUS) IMPLANT
SHEATH BRITE TIP 7FR 35CM (SHEATH) IMPLANT
SHEATH INTRODUCER SET 20-26 (SHEATH) IMPLANT
SHEATH PINNACLE 6F 10CM (SHEATH) IMPLANT
SHEATH PINNACLE 8F 10CM (SHEATH) IMPLANT
SHEATH PROBE COVER 6X72 (BAG) IMPLANT
SHIELD CATH-GARD CONTAMINATION (MISCELLANEOUS) IMPLANT
STOPCOCK MORSE 400PSI 3WAY (MISCELLANEOUS) ×4 IMPLANT
TRANSDUCER W/STOPCOCK (MISCELLANEOUS) IMPLANT
TUBING ART PRESS 72 MALE/FEM (TUBING) IMPLANT
WIRE AMPLATZ SS-J .035X180CM (WIRE) IMPLANT
WIRE EMERALD 3MM-J .035X150CM (WIRE) IMPLANT
WIRE EMERALD 3MM-J .035X260CM (WIRE) IMPLANT
WIRE SAFARI SM CURVE 275 (WIRE) IMPLANT

## 2023-09-18 NOTE — Op Note (Signed)
 [facility]   MULTIDISCIPLINARY HEART VALVE TEAM   TAVR OPERATIVE NOTE   Date of Procedure:  09/18/2023  Preoperative Diagnosis: Severe Aortic Stenosis   Postoperative Diagnosis: Same   Procedure:   Transcatheter Aortic Valve Replacement - Percutaneous right Transfemoral Approach  Edwards Sapien 3 Ultra THV (size 26 mm, model # 975RSL)   Co-Surgeons:  Melene Sportsman MD and  Antoinette Batman    Anesthesiologist:  Lelan Purpura MD  Echocardiographer:  Janelle Mediate MD  Pre-operative Echo Findings: Severe aortic stenosis normal left ventricular systolic function  Post-operative Echo Findings: no paravalvular leak normal left ventricular systolic function   BRIEF CLINICAL NOTE AND INDICATIONS FOR SURGERY  73 yo female with NYHA class 2 symptoms of severe AS with normal LV function with moderate CAD. With her age and mobility issues, she would best be served with TAVR.     DETAILS OF THE OPERATIVE PROCEDURE  PREPARATION:    The patient was brought to the operating room on the above mentioned date and appropriate monitoring was established by the anesthesia team. The patient was placed in the supine position on the operating table.  Intravenous antibiotics were administered. The patient was monitored closely throughout the procedure under conscious sedation.  Baseline transthoracic echocardiogram was performed. The patient's abdomen and both groins were prepped and draped in a sterile manner. A time out procedure was performed.   PERIPHERAL ACCESS:    Using the modified Seldinger technique, femoral arterial and venous access was obtained with placement of 6 Fr sheaths on the left side.  A pigtail diagnostic catheter was passed through the left arterial sheath under fluoroscopic guidance into the aortic root.  A temporary transvenous pacemaker catheter was passed through the left femoral venous sheath under fluoroscopic guidance into the right ventricle.  The  pacemaker was tested to ensure stable lead placement and pacemaker capture. Aortic root angiography was performed in order to determine the optimal angiographic angle for valve deployment.   TRANSFEMORAL ACCESS:   Percutaneous transfemoral access and sheath placement was performed using ultrasound guidance.  The right common femoral artery was cannulated using a micropuncture needle and appropriate location was verified using hand injection angiogram.  A pair of Abbott Perclose percutaneous closure devices were placed and a 6 French sheath replaced into the femoral artery.  The patient was heparinized systemically and ACT verified > 250 seconds.    A 14 Fr transfemoral E-sheath was introduced into the right common femoral artery after progressively dilating over an Amplatz superstiff wire. An AL1 catheter was used to direct a straight-tip exchange length wire across the native aortic valve into the left ventricle. This was exchanged out for a pigtail catheter and position was confirmed in the LV apex. Simultaneous LV and Ao pressures were recorded. LVEDP was 19 mmHg The pigtail catheter was exchanged for a Safari wire in the LV apex.   BALLOON AORTIC VALVULOPLASTY:  Was not performed  TRANSCATHETER HEART VALVE DEPLOYMENT:   An Edwards Sapien 3 Ultra transcatheter heart valve (size 26 mm) was prepared and crimped per manufacturer's guidelines, and the proper orientation of the valve is confirmed on the [employer] delivery system. The valve was advanced through the introducer sheath using normal technique until in an appropriate position in the abdominal aorta beyond the sheath tip. The balloon was then retracted and using the fine-tuning wheel was centered on the valve. The valve was then advanced across the aortic arch using appropriate flexion of the catheter. The valve was  carefully positioned across the aortic valve annulus. The Commander catheter was retracted using normal technique. Once  final position of the valve has been confirmed by angiographic assessment, the valve is deployed during rapid ventricular pacing to maintain systolic blood pressure < 50 mmHg and pulse pressure < 10 mmHg. The balloon inflation is held for >3 seconds after reaching full deployment volume. Once the balloon has fully deflated the balloon is retracted into the ascending aorta and valve function is assessed using echocardiography. There is felt to be no paravalvular leak and no central aortic insufficiency.  The patient's hemodynamic recovery following valve deployment is good.  The deployment balloon and guidewire are both removed.    PROCEDURE COMPLETION:   The sheath was removed and femoral artery closure performed.  Protamine  was administered once femoral arterial repair was complete. The temporary pacemaker, pigtail catheter and femoral sheaths were removed with manual pressure used for venous hemostasis.  A Mynx femoral closure device was utilized following removal of the diagnostic sheath in the lef femoral artery.  The patient tolerated the procedure well and is transported to the cath lab recovery area in stable condition. There were no immediate intraoperative complications. All sponge instrument and needle counts are verified correct at completion of the operation.   No blood products were administered during the operation.  The patient received a total of 60 mL of intravenous contrast during the procedure.   Melene Sportsman, MD 09/18/2023 3:30 PM

## 2023-09-18 NOTE — Discharge Instructions (Signed)

## 2023-09-18 NOTE — Transfer of Care (Signed)
 Immediate Anesthesia Transfer of Care Note  Patient: Danielle Beck  Procedure(s) Performed: Transcatheter Aortic Valve Replacement, Transfemoral (Right) ECHOCARDIOGRAM, TRANSTHORACIC  Patient Location: PACU  Anesthesia Type:MAC  Level of Consciousness: awake, oriented, and drowsy  Airway & Oxygen Therapy: Patient connected to nasal cannula oxygen  Post-op Assessment: Report given to RN and Post -op Vital signs reviewed and stable  Post vital signs: Reviewed and stable  Last Vitals:  Vitals Value Taken Time  BP 116/54 09/18/23 1531  Temp    Pulse 67 09/18/23 1534  Resp 17 09/18/23 1534  SpO2 95 % 09/18/23 1534  Vitals shown include unfiled device data.  Last Pain:  Vitals:   09/18/23 1535  TempSrc:   PainSc: 0-No pain         Complications: There were no known notable events for this encounter.

## 2023-09-18 NOTE — Anesthesia Postprocedure Evaluation (Signed)
 Anesthesia Post Note  Patient: Danielle Beck  Procedure(s) Performed: Transcatheter Aortic Valve Replacement, Transfemoral (Right) ECHOCARDIOGRAM, TRANSTHORACIC     Patient location during evaluation: PACU Anesthesia Type: MAC Level of consciousness: awake and alert Pain management: pain level controlled Vital Signs Assessment: post-procedure vital signs reviewed and stable Respiratory status: spontaneous breathing, nonlabored ventilation, respiratory function stable and patient connected to nasal cannula oxygen Cardiovascular status: stable and blood pressure returned to baseline Postop Assessment: no apparent nausea or vomiting Anesthetic complications: no   There were no known notable events for this encounter.  Last Vitals:  Vitals:   09/18/23 1800 09/18/23 1900  BP: (!) 132/58 (!) 148/61  Pulse: 73 77  Resp: 16 15  Temp:  36.7 C  SpO2: 97% 99%    Last Pain:  Vitals:   09/18/23 1900  TempSrc: Oral  PainSc:                  Lethaniel Rave

## 2023-09-18 NOTE — Discharge Summary (Signed)
 HEART AND VASCULAR CENTER   MULTIDISCIPLINARY HEART VALVE TEAM  Discharge Summary    Patient ID: Danielle Beck MRN: [id number]; DOB: [birth year]  Admit date: 09/18/2023 Discharge date: 09/19/2023  PCP:  Elida Grounds, DO  [facility] Cardiologist:  Cody Das, MD  [facility] Structural heart: Antoinette Batman, MD [facility] Electrophysiologist:  None   Discharge Diagnoses    Principal Problem:   S/P TAVR (transcatheter aortic valve replacement)  Edwards Sapien 3 THV (size 26 mm) 09/18/2023 Active Problems:   Essential hypertension   Chronic heart failure with preserved ejection fraction (HCC)   PAF (paroxysmal atrial fibrillation) (HCC)   Severe aortic stenosis   Type 2 diabetes mellitus with hyperlipidemia (HCC)   Class 1 obesity   Mixed hyperlipidemia   Allergies No Known Allergies  Diagnostic Studies/Procedures    HEART AND VASCULAR CENTER  TAVR OPERATIVE NOTE     Date of Procedure:                09/18/2023   Preoperative Diagnosis:      Severe Aortic Stenosis    Postoperative Diagnosis:    Same    Procedure:        Transcatheter Aortic Valve Replacement - Transfemoral Approach             Edwards Sapien 3 THV (size 26 mm, model # [id number], serial # [id number])              Co-Surgeons:                        Antoinette Batman, MD and Melene Sportsman , MD    Anesthesiologist:                  Elby Green   Echocardiographer:              Stann Earnest   Pre-operative Echo Findings: Severe aortic stenosis Normal left ventricular systolic function   Post-operative Echo Findings: No paravalvular leak Normal left ventricular systolic function _____________    Echo 09/19/23: completed but pending formal read at the time of discharge   History of Present Illness     Danielle Beck is a 73 y.o. female with a history of CAD, T2DM, HTN, HLD, PAF on Xarelto , CKD stage IIIb, obesity, spinal stenosis and pLFLG AS who presented to [facility] on  09/18/23 for planned TAVR.   She has been followed over time for aortic stenosis. Echo 07/23/23 showed EF 55% and severe pLFLG AS with a mean grad 31 mmHg, AVA 0.56 cm2, DVI 0.20,and mild-mod MR. [facility] 08/03/23 showed moderate non obst CAD. She reported increasing DOE and chest pressure.   The patient was evaluated by the multidisciplinary valve team and felt to have severe, symptomatic aortic stenosis and to be a suitable candidate for TAVR, which was set up for 09/18/23.   Hospital Course     Consultants: none   Severe AS:  -- S/p successful TAVR with a 26 mm Edwards Sapien 3 Ultra Resilia THV via the TF approach on 09/18/23.  -- Post operative echo completed but pending formal read. -- Groin sites are stable.  -- ECG with sinus/1st deg AV block, but no high grade heart block. -- Resume home Xarelto  15mg  daily.  -- Met with cardiac rehab to discuss CRP phase II.  -- Plan for discharge home today with close follow up in the outpatient setting.   HTN: -- BP well controlled. -- Resume home Norvasc   10mg  daily, Lopressor  25mg  BID and losartan  50mg  daily.   HFpEF: -- Appears euvolemic. -- LVEDP 19 mm hg at the time of TAVR.  -- Resume home torsemide  40mg  BID.  PAF: -- Maintaining sinus during admission.  -- Resume home Lopressor  25mg  BID. -- Resume home Xarelto  15mg  daily.   HLD: -- Continue atorvastatin  10mg  daily.   CKD stage IIIb:  -- Creat 1.24 today.   DMT2:  -- Treated with SSI while admitted. -- Resume home meds at discharge.  -- Okay to resume Metformin  after 48 hours after contrast dye exposure (4/25 AM)   Pulmonary nodule: -- Pre TAVR CTs noted a "single area of subpleural nodularity immediately posterior to the transverse portion of the azygous vein and adjacent to the medial aspect of the major fissure and an adjacent accessory fissure measuring approximately 5 mm. This may represent an intrapulmonary lymph node. No follow-up needed if patient is low-risk." -- This  will be discussed in the outpatient setting.   _____________  Discharge Vitals Blood pressure (!) 138/47, pulse 89, temperature 98.5 F (36.9 C), temperature source Oral, resp. rate 15, height 5' 1.5" (1.562 m), weight 81.9 kg, SpO2 97%.  Filed Weights   09/18/23 1059 09/19/23 0128  Weight: 83 kg 81.9 kg     GEN: Well nourished, well developed in no acute distress NECK: No JVD CARDIAC: RRR, no murmurs, rubs, gallops RESPIRATORY:  Clear to auscultation without rales, wheezing or rhonchi  ABDOMEN: Soft, non-tender, non-distended EXTREMITIES:  No edema; No deformity.  Groin sites clear without hematoma or ecchymosis.   Disposition   Pt is being discharged home today in good condition.  Follow-up Plans & Appointments     Follow-up Information     Ardia Kraft, PA-C. Go on 09/24/2023.   Specialties: Cardiology, Radiology Why: @ 11:10am. PLEASE NOTE THIS ADDRESS IS INCORRECT. PLEASE COME TO OUR NEW OFFICE AT [address] AT LEAST 30 MINUTES EARLY DUE TO DELAYS RELATED TO THIS BEING OUR FIRST DAY IN THE OFFICE. OUR OFFICE IS ON THE 4TH FLOOR. Contact information: [address] [place] [address] [phone]                Discharge Instructions     Amb Referral to Cardiac Rehabilitation   Complete by: As directed    Diagnosis: Valve Replacement   Valve: Aortic   After initial evaluation and assessments completed: Virtual Based Care may be provided alone or in conjunction with Phase 2 Cardiac Rehab based on patient barriers.: Yes   Intensive Cardiac Rehabilitation (ICR) [facility] location only OR Traditional Cardiac Rehabilitation (TCR) *If criteria for ICR are not met will enroll in TCR [facility] only): Yes       Discharge Medications   Allergies as of 09/19/2023   No Known Allergies      Medication List     PAUSE taking these medications    metFORMIN  1000 MG tablet Wait to take this until: September 21, 2023 Morning Commonly known  as: GLUCOPHAGE  Take 1 tablet (1,000 mg total) by mouth 2 (two) times daily with a meal.       TAKE these medications    amLODipine  10 MG tablet Commonly known as: NORVASC  Take 1 tablet (10 mg total) by mouth daily.   atorvastatin  10 MG tablet Commonly known as: LIPITOR Take 2 tablets (20 mg total) by mouth daily for cholesterol.   CALCIUM  600 + D PO Take 1 tablet by mouth 2 (two) times daily.  cyanocobalamin 1000 MCG tablet Commonly known as: VITAMIN B12 Take 1,000 mcg by mouth daily.   dorzolamide  2 % ophthalmic solution Commonly known as: TRUSOPT  Place 1 drop into the left eye 2 (two) times daily.   Easy Touch Alcohol  Prep Medium 70 % Pads Use as directed aily   Iron 325 (65 Fe) MG Tabs Take 325 mg by mouth 2 (two) times daily.   Lantus  SoloStar 100 UNIT/ML Solostar Pen Generic drug: insulin  glargine Inject 34 Units into the skin daily.   latanoprost  0.005 % ophthalmic solution Commonly known as: XALATAN  Place 1 drop into both eyes at bedtime.   losartan  50 MG tablet Commonly known as: COZAAR  Take 1 tablet (50 mg total) by mouth daily for blood pressure.   methocarbamol  500 MG tablet Commonly known as: ROBAXIN  Take 2 tablets (1,000 mg total) by mouth daily as needed for muscle spasms. What changed: when to take this   metoprolol  tartrate 25 MG tablet Commonly known as: LOPRESSOR  Take 1 tablet (25 mg total) by mouth 2 (two) times daily.   nitroGLYCERIN  0.4 MG SL tablet Commonly known as: NITROSTAT  Place 0.4 mg under the tongue every 5 (five) minutes as needed for chest pain.   OneTouch Delica Plus Lancet33G Misc Use to check blood sugar daily.   OneTouch Verio Flex System w/Device Kit Use as directed. What changed: Another medication with the same name was removed. Continue taking this medication, and follow the directions you see here.   OneTouch Verio test strip Generic drug: glucose blood For use when checking blood sugars twice daily. What  changed: Another medication with the same name was removed. Continue taking this medication, and follow the directions you see here.   pregabalin  75 MG capsule Commonly known as: LYRICA  Take 1 capsule (75 mg total) by mouth 2 (two) times daily. What changed:  how much to take when to take this   pyridOXINE 100 MG tablet Commonly known as: VITAMIN B6 Take 100 mg by mouth daily.   Sure Comfort Pen Needles 32G X 6 MM Misc Generic drug: Insulin  Pen Needle Use to inject insulin  once daily.   timolol  0.5 % ophthalmic solution Commonly known as: TIMOPTIC  Place 1 drop into the left eye 2 (two) times daily.   torsemide  20 MG tablet Commonly known as: DEMADEX  Take 2 tablets (40 mg total) by mouth 2 (two) times daily. What changed:  how much to take when to take this additional instructions   traMADol  50 MG tablet Commonly known as: ULTRAM  Take 1 tablet (50 mg total) by mouth every 6 (six) hours as needed for moderate pain   vitamin B-1 250 MG tablet Take 250 mg by mouth daily.   Vitamin D3 1000 units Caps Take 1,000 Units by mouth daily.   Xarelto  15 MG Tabs tablet Generic drug: Rivaroxaban  TAKE 1 TABLET EVERY DAY WITH SUPPER          Outstanding Labs/Studies   none  ______________________  Duration of Discharge Encounter: APP Time: 20 minutes MD Time: 22 minutes     Signed, Abagail Hoar, PA-C 09/19/2023, 10:58 AM [phone]

## 2023-09-18 NOTE — CV Procedure (Signed)
 HEART AND VASCULAR CENTER  TAVR OPERATIVE NOTE   Date of Procedure:  09/18/2023  Preoperative Diagnosis: Severe Aortic Stenosis   Postoperative Diagnosis: Same   Procedure:   Transcatheter Aortic Valve Replacement - Transfemoral Approach  Edwards Sapien 3 THV (size 26 mm, model # [id number], serial # [id number])   Co-Surgeons:  Antoinette Batman, MD and Melene Sportsman , MD   Anesthesiologist:  Elby Green  Echocardiographer:  Stann Earnest  Pre-operative Echo Findings: Severe aortic stenosis Normal left ventricular systolic function  Post-operative Echo Findings: No paravalvular leak Normal left ventricular systolic function  BRIEF CLINICAL NOTE AND INDICATIONS FOR SURGERY  73 yo female with severe aortic stenosis. Cardiac cath with moderate non-obstructive CAD.    During the course of the patient's preoperative work up they have been evaluated comprehensively by a multidisciplinary team of specialists coordinated through the Multidisciplinary Heart Valve Clinic in the [facility].  They have been demonstrated to suffer from symptomatic severe aortic stenosis as noted above. The patient has been counseled extensively as to the relative risks and benefits of all options for the treatment of severe aortic stenosis including long term medical therapy, conventional surgery for aortic valve replacement, and transcatheter aortic valve replacement.  The patient has been independently evaluated by Dr. Honey Lusty with CT surgery and they are felt to be at high risk for conventional surgical aortic valve replacement. The surgeon indicated the patient would be a poor candidate for conventional surgery. Based upon review of all of the patient's preoperative diagnostic tests they are felt to be candidate for transcatheter aortic valve replacement using the transfemoral approach as an alternative to high risk conventional surgery.    Following the decision to proceed with transcatheter  aortic valve replacement, a discussion has been held regarding what types of management strategies would be attempted intraoperatively in the event of life-threatening complications, including whether or not the patient would be considered a candidate for the use of cardiopulmonary bypass and/or conversion to open sternotomy for attempted surgical intervention.  The patient has been advised of a variety of complications that might develop peculiar to this approach including but not limited to risks of death, stroke, paravalvular leak, aortic dissection or other major vascular complications, aortic annulus rupture, device embolization, cardiac rupture or perforation, acute myocardial infarction, arrhythmia, heart block or bradycardia requiring permanent pacemaker placement, congestive heart failure, respiratory failure, renal failure, pneumonia, infection, other late complications related to structural valve deterioration or migration, or other complications that might ultimately cause a temporary or permanent loss of functional independence or other long term morbidity.  The patient provides full informed consent for the procedure as described and all questions were answered preoperatively.    DETAILS OF THE OPERATIVE PROCEDURE  PREPARATION:   The patient is brought to the operating room on the above mentioned date and central monitoring was established by the anesthesia team including placement of a radial arterial line. The patient is placed in the supine position on the operating table.  Intravenous antibiotics are administered. Conscious sedation is used.   Baseline transthoracic echocardiogram was performed. The patient's chest, abdomen, both groins, and both lower extremities are prepared and draped in a sterile manner. A time out procedure is performed.   PERIPHERAL ACCESS:   Using the modified Seldinger technique, femoral arterial and venous access were obtained with placement of a 6 Fr sheath  in the artery and a 7 Fr sheath in the vein on the left side using u/s guidance.  A  pigtail diagnostic catheter was passed through the femoral arterial sheath under fluoroscopic guidance into the aortic root.  A temporary transvenous pacemaker catheter was passed through the femoral venous sheath under fluoroscopic guidance into the right ventricle.  The pacemaker was tested to ensure stable lead placement and pacemaker capture. Aortic root angiography was performed in order to determine the optimal angiographic angle for valve deployment.  TRANSFEMORAL ACCESS:  A micropuncture kit was used to gain access to the right femoral artery using u/s guidance. Position confirmed with angiography. Pre-closure with double ProGlide closure devices. The patient was heparinized systemically and ACT verified > 250 seconds.    A 14 Fr transfemoral E-sheath was introduced into the right femoral artery after progressively dilating over an Amplatz superstiff wire. An AL-1 catheter was used to direct a straight-tip exchange length wire across the native aortic valve into the left ventricle. This was exchanged out for a pigtail catheter and position was confirmed in the LV apex. Simultaneous LV and Ao pressures were recorded.  The pigtail catheter was then exchanged for a Safari wire in the LV apex.   TRANSCATHETER HEART VALVE DEPLOYMENT:  An Edwards Sapien 3 THV (size 26 mm) was prepared and crimped per manufacturer's guidelines, and the proper orientation of the valve is confirmed on the [employer] delivery system. The valve was advanced through the introducer sheath using normal technique until in an appropriate position in the abdominal aorta beyond the sheath tip. The balloon was then retracted and using the fine-tuning wheel was centered on the valve. The valve was then advanced across the aortic arch using appropriate flexion of the catheter. The valve was carefully positioned across the aortic valve annulus. The  Commander catheter was retracted using normal technique. Once final position of the valve has been confirmed by angiographic assessment, the valve is deployed while temporarily holding ventilation and during rapid ventricular pacing to maintain systolic blood pressure < 50 mmHg and pulse pressure < 10 mmHg. The balloon inflation is held for >3 seconds after reaching full deployment volume. Once the balloon has fully deflated the balloon is retracted into the ascending aorta and valve function is assessed using TTE. There is felt to be no paravalvular leak and no central aortic insufficiency.  The patient's hemodynamic recovery following valve deployment is good.  The deployment balloon and guidewire are both removed. Echo demostrated acceptable post-procedural gradients, stable mitral valve function, and no AI.   PROCEDURE COMPLETION:  The sheath was then removed and closure devices were completed. Protamine  was administered once femoral arterial repair was complete. The temporary pacemaker, pigtail catheters and femoral sheaths were removed with a Mynx closure device placed in the artery and manual pressure used for venous hemostasis.    The patient tolerated the procedure well and is transported to the surgical intensive care in stable condition. There were no immediate intraoperative complications. All sponge instrument and needle counts are verified correct at completion of the operation.   No blood products were administered during the operation.  The patient received a total of 50 mL of intravenous contrast during the procedure.  LVEDP: 19 mm Hg  Antoinette Batman MD, [facility] 09/18/2023 3:39 PM

## 2023-09-18 NOTE — Interval H&P Note (Signed)
 History and Physical Interval Note:  09/18/2023 11:26 AM  Danielle Beck  has presented today for surgery, with the diagnosis of Severe Aortic Stenosis.  The various methods of treatment have been discussed with the patient and family. After consideration of risks, benefits and other options for treatment, the patient has consented to  Procedure(s): Transcatheter Aortic Valve Replacement, Transfemoral (Right) ECHOCARDIOGRAM, TRANSTHORACIC (N/A) as a surgical intervention.  The patient's history has been reviewed, patient examined, no change in status, stable for surgery.  I have reviewed the patient's chart and labs.  Questions were answered to the patient's satisfaction.     Melene Sportsman

## 2023-09-18 NOTE — Progress Notes (Signed)
 Patient arrived from cath lab to 4E01 after TAVR procedure with Dr. Abel Hoe and Dr. Honey Lusty.  Patient is INAD, alert and oriented x4.  Vital signs stable.  Right and left groin sites level 0.  Telemetry monitor applied and CCMD notified.  CHG bath and skin assessment completed.  Patient oriented to unit and room to include call light and phone.  Patient's spouse called at patient's request and given update and new room number.  Call light within reach.  All needs addressed.

## 2023-09-18 NOTE — TOC Initial Note (Signed)
 Transition of Care [facility]) - Initial/Assessment Note    Patient Details  Name: Danielle Beck MRN: [id number] Date of Birth: 1950/08/04  Transition of Care [facility]) CM/SW Contact:    Omie Bickers, RN Phone Number: 09/18/2023, 3:45 PM  Clinical Narrative:                  Chart reviewed.  Patient admitted for TAVR.  From home w spouse.  No TOC needs identified at this time, will follow through progression rounds   Expected Discharge Plan: Home/Self Care Barriers to Discharge: Continued Medical Work up   Patient Goals and CMS Choice            Expected Discharge Plan and Services   Discharge Planning Services: CM Consult   Living arrangements for the past 2 months: Single Family Home                                      Prior Living Arrangements/Services Living arrangements for the past 2 months: Single Family Home Lives with:: Spouse                   Activities of Daily Living   ADL Screening (condition at time of admission) Independently performs ADLs?: Yes (appropriate for developmental age) Is the patient deaf or have difficulty hearing?: No Does the patient have difficulty seeing, even when wearing glasses/contacts?: No Does the patient have difficulty concentrating, remembering, or making decisions?: No  Permission Sought/Granted                  Emotional Assessment              Admission diagnosis:  Aortic stenosis, severe [I35.0] Patient Active Problem List   Diagnosis Date Noted   Mixed hyperlipidemia 03/16/2023   Class 1 obesity 08/20/2022   Type 2 diabetes mellitus with hyperlipidemia (HCC) 08/19/2022   Normocytic anemia 08/19/2022   Moderate mitral regurgitation 04/02/2020   Severe aortic stenosis 04/02/2020   PAF (paroxysmal atrial fibrillation) (HCC) 10/15/2019   Essential hypertension 06/27/2019   Chronic heart failure with preserved ejection fraction (HCC) 06/27/2019   Hyponatremia 03/21/2019   PCP:  Elida Grounds, DO Pharmacy:   [facility] [address] [place] [address] Phone: [phone] Fax: [phone]     Social Drivers of Health (SDOH) Social History: SDOH Screenings   Tobacco Use: Low Risk  (09/18/2023)   SDOH Interventions:     Readmission Risk Interventions    08/22/2022   12:27 PM 08/22/2022   12:26 PM  Readmission Risk Prevention Plan  Transportation Screening  Complete  PCP or Specialist Appt within 5-7 Days Not Complete Complete  Not Complete comments apt for 08/31/22   Home Care Screening  Complete  Medication Review (RN CM)  Complete

## 2023-09-19 ENCOUNTER — Inpatient Hospital Stay (HOSPITAL_COMMUNITY)

## 2023-09-19 ENCOUNTER — Encounter (HOSPITAL_COMMUNITY): Payer: Self-pay | Admitting: Cardiovascular Disease

## 2023-09-19 DIAGNOSIS — Z952 Presence of prosthetic heart valve: Secondary | ICD-10-CM

## 2023-09-19 DIAGNOSIS — I350 Nonrheumatic aortic (valve) stenosis: Secondary | ICD-10-CM

## 2023-09-19 DIAGNOSIS — Z954 Presence of other heart-valve replacement: Secondary | ICD-10-CM

## 2023-09-19 LAB — BASIC METABOLIC PANEL WITH GFR
Anion gap: 11 (ref 5–15)
BUN: 27 mg/dL — ABNORMAL HIGH (ref 8–23)
CO2: 19 mmol/L — ABNORMAL LOW (ref 22–32)
Calcium: 8.9 mg/dL (ref 8.9–10.3)
Chloride: 110 mmol/L (ref 98–111)
Creatinine, Ser: 1.24 mg/dL — ABNORMAL HIGH (ref 0.44–1.00)
GFR, Estimated: 46 mL/min — ABNORMAL LOW (ref >60)
Glucose, Bld: 194 mg/dL — ABNORMAL HIGH (ref 70–99)
Potassium: 4 mmol/L (ref 3.5–5.1)
Sodium: 140 mmol/L (ref 135–145)

## 2023-09-19 LAB — CBC
HCT: 26.4 % — ABNORMAL LOW (ref 36.0–46.0)
Hemoglobin: 8.7 g/dL — ABNORMAL LOW (ref 12.0–15.0)
MCH: 31 pg (ref 26.0–34.0)
MCHC: 33 g/dL (ref 30.0–36.0)
MCV: 94 fL (ref 80.0–100.0)
Platelets: 184 10*3/uL (ref 150–400)
RBC: 2.81 MIL/uL — ABNORMAL LOW (ref 3.87–5.11)
RDW: 13.4 % (ref 11.5–15.5)
WBC: 8.5 10*3/uL (ref 4.0–10.5)
nRBC: 0 % (ref 0.0–0.2)

## 2023-09-19 LAB — ECHOCARDIOGRAM COMPLETE
AR max vel: 2.45 cm2
AV Area VTI: 2.54 cm2
AV Area mean vel: 2.4 cm2
AV Mean grad: 5.8 mmHg
AV Peak grad: 11.5 mmHg
Ao pk vel: 1.7 m/s
Area-P 1/2: 5.46 cm2
Calc EF: 63 %
Height: 61.5 in
MV M vel: 5.42 m/s
MV Peak grad: 117.5 mmHg
MV VTI: 2.69 cm2
S' Lateral: 3.4 cm
Single Plane A2C EF: 58.5 %
Single Plane A4C EF: 63.8 %
Weight: 2888 [oz_av]

## 2023-09-19 LAB — MAGNESIUM: Magnesium: 2 mg/dL (ref 1.7–2.4)

## 2023-09-19 NOTE — Progress Notes (Signed)
  Echocardiogram 2D Echocardiogram has been performed.  Reginal Wojcicki L Glenda Kunst RDCS 09/19/2023, 9:05 AM

## 2023-09-19 NOTE — Plan of Care (Signed)
  Problem: Education: Goal: Knowledge of General Education information will improve Description: Including pain rating scale, medication(s)/side effects and non-pharmacologic comfort measures Outcome: Adequate for Discharge   Problem: Health Behavior/Discharge Planning: Goal: Ability to manage health-related needs will improve Outcome: Adequate for Discharge   Problem: Clinical Measurements: Goal: Ability to maintain clinical measurements within normal limits will improve Outcome: Adequate for Discharge Goal: Will remain free from infection Outcome: Adequate for Discharge Goal: Diagnostic test results will improve Outcome: Adequate for Discharge Goal: Respiratory complications will improve Outcome: Adequate for Discharge Goal: Cardiovascular complication will be avoided Outcome: Adequate for Discharge   Problem: Activity: Goal: Risk for activity intolerance will decrease Outcome: Adequate for Discharge   Problem: Nutrition: Goal: Adequate nutrition will be maintained Outcome: Adequate for Discharge   Problem: Skin Integrity: Goal: Risk for impaired skin integrity will decrease Outcome: Adequate for Discharge   Problem: Safety: Goal: Ability to remain free from injury will improve Outcome: Adequate for Discharge

## 2023-09-19 NOTE — TOC Transition Note (Signed)
 Transition of Care [facility]) - Discharge Note   Patient Details  Name: Danielle Beck MRN: [id number] Date of Birth: November 12, 1950  Transition of Care [facility]) CM/SW Contact:  Jennett Model, RN Phone Number: 09/19/2023, 10:08 AM   Clinical Narrative:    For possible dc today, has no needs.     Barriers to Discharge: Continued Medical Work up   Patient Goals and CMS Choice            Discharge Placement                       Discharge Plan and Services Additional resources added to the After Visit Summary for     Discharge Planning Services: CM Consult                                 Social Drivers of Health (SDOH) Interventions SDOH Screenings   Food Insecurity: No Food Insecurity (09/19/2023)  Housing: Low Risk  (09/19/2023)  Transportation Needs: No Transportation Needs (09/19/2023)  Utilities: Not At Risk (09/19/2023)  Social Connections: Unknown (09/19/2023)  Tobacco Use: Low Risk  (09/18/2023)     Readmission Risk Interventions    08/22/2022   12:27 PM 08/22/2022   12:26 PM  Readmission Risk Prevention Plan  Transportation Screening  Complete  PCP or Specialist Appt within 5-7 Days Not Complete Complete  Not Complete comments apt for 08/31/22   Home Care Screening  Complete  Medication Review (RN CM)  Complete

## 2023-09-19 NOTE — Progress Notes (Signed)
      [address]       Danielle Beck [address]             [phone]      1 Day Post-Op  Procedure(s) (LRB): Transcatheter Aortic Valve Replacement, Transfemoral (Right) ECHOCARDIOGRAM, TRANSTHORACIC (N/A)   Total Length of Stay:  LOS: 1 day    SUBJECTIVE: Had some pain meds overnight which helped access site discomfort Ambulated already  Vitals:   09/19/23 0128 09/19/23 0340  BP:  (!) 140/60  Pulse:  87  Resp: 20 19  Temp:  98.4 F (36.9 C)  SpO2:  97%    Intake/Output      04/22 0701 04/23 0700 04/23 0701 04/24 0700   I.V. (mL/kg) 1100 (13.4)    Total Intake(mL/kg) 1100 (13.4)    Urine (mL/kg/hr) 700    Blood 25    Total Output 725    Net +375         Urine Occurrence 2 x        sodium chloride  Stopped (09/19/23 0008)    CBC    Component Value Date/Time   WBC 8.5 09/19/2023 0350   RBC 2.81 (L) 09/19/2023 0350   HGB 8.7 (L) 09/19/2023 0350   HGB 10.1 (L) 07/25/2023 1601   HCT 26.4 (L) 09/19/2023 0350   HCT 30.8 (L) 07/25/2023 1601   PLT 184 09/19/2023 0350   PLT 270 07/25/2023 1601   MCV 94.0 09/19/2023 0350   MCV 95 07/25/2023 1601   MCH 31.0 09/19/2023 0350   MCHC 33.0 09/19/2023 0350   RDW 13.4 09/19/2023 0350   RDW 12.3 07/25/2023 1601   LYMPHSABS 2.0 07/25/2023 1601   MONOABS 0.9 08/19/2022 0415   EOSABS 0.3 07/25/2023 1601   BASOSABS 0.0 07/25/2023 1601   CMP     Component Value Date/Time   NA 140 09/19/2023 0350   NA 138 08/13/2023 1047   K 4.0 09/19/2023 0350   CL 110 09/19/2023 0350   CO2 19 (L) 09/19/2023 0350   GLUCOSE 194 (H) 09/19/2023 0350   BUN 27 (H) 09/19/2023 0350   BUN 36 (H) 08/13/2023 1047   CREATININE 1.24 (H) 09/19/2023 0350   CALCIUM  8.9 09/19/2023 0350   CALCIUM  9.6 06/12/2023 0719   PROT 7.2 09/14/2023 0950   PROT 6.3 03/25/2019 0900   ALBUMIN 3.7 09/14/2023 0950   ALBUMIN 3.9 03/25/2019 0900   AST 25 09/14/2023 0950   ALT 19 09/14/2023 0950   ALKPHOS 49 09/14/2023 0950   BILITOT  0.5 09/14/2023 0950   BILITOT 0.2 03/25/2019 0900   GFRNONAA 46 (L) 09/19/2023 0350   GFRAA 76 04/12/2020 0858   ABG    Component Value Date/Time   PHART 7.357 08/03/2023 1233   PCO2ART 35.2 08/03/2023 1233   PO2ART 67 (L) 08/03/2023 1233   HCO3 21.0 08/03/2023 1237   TCO2 19 (L) 09/18/2023 1502   ACIDBASEDEF 4.0 (H) 08/03/2023 1237   O2SAT 55 08/03/2023 1237   CBG (last 3)  Recent Labs    09/18/23 1104 09/18/23 1259  GLUCAP 121* 119*  EXAM Lungs; clear Card:RR no murmur Ext: going site soft and slightly tender Neuro: intact   ASSESSMENT: SP TAVR Doing well Rhythm stable Groin sites ok For Echo today Home later   Melene Sportsman, MD 09/19/2023

## 2023-09-19 NOTE — Progress Notes (Signed)
 Mobility Specialist Progress Note:    09/19/23 0950  Mobility  Activity Ambulated with assistance in hallway;Ambulated with assistance in room  Level of Assistance Contact guard assist, steadying assist  Assistive Device Front wheel walker  Distance Ambulated (ft) 260 ft  Activity Response Tolerated well  Mobility Referral Yes  Mobility visit 1 Mobility  Mobility Specialist Start Time (ACUTE ONLY) 0940  Mobility Specialist Stop Time (ACUTE ONLY) 0948  Mobility Specialist Time Calculation (min) (ACUTE ONLY) 8 min   Pt received in bed, agreeable to mobility session. Ambulated in room and hallway with RW and MinG. Tolerated well, limited by fatigue. Returned pt to room, family at bedside, eager for d/c. All needs met.  Gladyes Kudo Mobility Specialist Please contact via Special educational needs teacher or  Rehab office at [phone]

## 2023-09-19 NOTE — Plan of Care (Signed)
  Problem: Cardiovascular: Goal: Ability to achieve and maintain adequate cardiovascular perfusion will improve Outcome: Adequate for Discharge

## 2023-09-19 NOTE — Progress Notes (Signed)
 CARDIAC REHAB PHASE I    Post TAVR education including site care, restrictions, risk factors, heart healthy diabetic diet, exercise guidelines and CRP2 reviewed. All questions and concerns addressed. Will send referral for CRP2 to [facility]. Plan for discharge home later today.  [phone] Ronny Colas, RN BSN 09/19/2023 9:21 AM

## 2023-09-20 ENCOUNTER — Telehealth: Payer: Self-pay | Admitting: Physician Assistant

## 2023-09-20 NOTE — Telephone Encounter (Signed)
  [facility]   MULTIDISCIPLINARY HEART VALVE TEAM   Patient contacted regarding discharge from [facility] on 09/19/23.  Patient understands to follow up with a structural heart provider on 09/24/23 at [facility]. Patient understands discharge instructions? yes Patient understands medications and regimen? yes Patient understands to bring all medications to this visit? yes  Abagail Hoar PA-C  [facility]

## 2023-09-21 ENCOUNTER — Other Ambulatory Visit: Payer: Self-pay

## 2023-09-21 ENCOUNTER — Encounter (HOSPITAL_COMMUNITY): Payer: Self-pay | Admitting: Emergency Medicine

## 2023-09-21 ENCOUNTER — Emergency Department (HOSPITAL_COMMUNITY)
Admission: EM | Admit: 2023-09-21 | Discharge: 2023-09-21 | Disposition: A | Discharge status: Home / Self Care | Attending: Emergency Medicine | Admitting: Emergency Medicine

## 2023-09-21 ENCOUNTER — Emergency Department (HOSPITAL_COMMUNITY)

## 2023-09-21 DIAGNOSIS — I517 Cardiomegaly: Secondary | ICD-10-CM | POA: Diagnosis not present

## 2023-09-21 DIAGNOSIS — N179 Acute kidney failure, unspecified: Secondary | ICD-10-CM | POA: Diagnosis not present

## 2023-09-21 DIAGNOSIS — Z794 Long term (current) use of insulin: Secondary | ICD-10-CM | POA: Insufficient documentation

## 2023-09-21 DIAGNOSIS — I509 Heart failure, unspecified: Secondary | ICD-10-CM | POA: Insufficient documentation

## 2023-09-21 DIAGNOSIS — I110 Hypertensive heart disease with heart failure: Secondary | ICD-10-CM | POA: Diagnosis not present

## 2023-09-21 DIAGNOSIS — E119 Type 2 diabetes mellitus without complications: Secondary | ICD-10-CM | POA: Insufficient documentation

## 2023-09-21 DIAGNOSIS — Z79899 Other long term (current) drug therapy: Secondary | ICD-10-CM | POA: Diagnosis not present

## 2023-09-21 DIAGNOSIS — Z7901 Long term (current) use of anticoagulants: Secondary | ICD-10-CM | POA: Diagnosis not present

## 2023-09-21 DIAGNOSIS — R0602 Shortness of breath: Secondary | ICD-10-CM | POA: Diagnosis not present

## 2023-09-21 DIAGNOSIS — E871 Hypo-osmolality and hyponatremia: Secondary | ICD-10-CM | POA: Diagnosis not present

## 2023-09-21 DIAGNOSIS — J9811 Atelectasis: Secondary | ICD-10-CM | POA: Diagnosis not present

## 2023-09-21 DIAGNOSIS — I5033 Acute on chronic diastolic (congestive) heart failure: Secondary | ICD-10-CM | POA: Diagnosis not present

## 2023-09-21 LAB — TROPONIN I (HIGH SENSITIVITY)
Troponin I (High Sensitivity): 130 ng/L (ref <18)
Troponin I (High Sensitivity): 136 ng/L (ref <18)

## 2023-09-21 LAB — CBC
HCT: 27.9 % — ABNORMAL LOW (ref 36.0–46.0)
Hemoglobin: 9.1 g/dL — ABNORMAL LOW (ref 12.0–15.0)
MCH: 30.6 pg (ref 26.0–34.0)
MCHC: 32.6 g/dL (ref 30.0–36.0)
MCV: 93.9 fL (ref 80.0–100.0)
Platelets: 180 10*3/uL (ref 150–400)
RBC: 2.97 MIL/uL — ABNORMAL LOW (ref 3.87–5.11)
RDW: 13.3 % (ref 11.5–15.5)
WBC: 13.7 10*3/uL — ABNORMAL HIGH (ref 4.0–10.5)
nRBC: 0 % (ref 0.0–0.2)

## 2023-09-21 LAB — BASIC METABOLIC PANEL WITH GFR
Anion gap: 10 (ref 5–15)
BUN: 31 mg/dL — ABNORMAL HIGH (ref 8–23)
CO2: 19 mmol/L — ABNORMAL LOW (ref 22–32)
Calcium: 9.5 mg/dL (ref 8.9–10.3)
Chloride: 100 mmol/L (ref 98–111)
Creatinine, Ser: 1.65 mg/dL — ABNORMAL HIGH (ref 0.44–1.00)
GFR, Estimated: 33 mL/min — ABNORMAL LOW (ref >60)
Glucose, Bld: 213 mg/dL — ABNORMAL HIGH (ref 70–99)
Potassium: 4.2 mmol/L (ref 3.5–5.1)
Sodium: 129 mmol/L — ABNORMAL LOW (ref 135–145)

## 2023-09-21 LAB — HEPATIC FUNCTION PANEL
ALT: 5 U/L (ref 0–44)
AST: 21 U/L (ref 15–41)
Albumin: 3 g/dL — ABNORMAL LOW (ref 3.5–5.0)
Alkaline Phosphatase: 43 U/L (ref 38–126)
Bilirubin, Direct: <0.1 mg/dL (ref 0.0–0.2)
Total Bilirubin: 0.5 mg/dL (ref 0.0–1.2)
Total Protein: 6.5 g/dL (ref 6.5–8.1)

## 2023-09-21 LAB — I-STAT CHEM 8, ED
BUN: 38 mg/dL — ABNORMAL HIGH (ref 8–23)
Calcium, Ion: 1.19 mmol/L (ref 1.15–1.40)
Chloride: 101 mmol/L (ref 98–111)
Creatinine, Ser: 1.8 mg/dL — ABNORMAL HIGH (ref 0.44–1.00)
Glucose, Bld: 212 mg/dL — ABNORMAL HIGH (ref 70–99)
HCT: 26 % — ABNORMAL LOW (ref 36.0–46.0)
Hemoglobin: 8.8 g/dL — ABNORMAL LOW (ref 12.0–15.0)
Potassium: 5.1 mmol/L (ref 3.5–5.1)
Sodium: 128 mmol/L — ABNORMAL LOW (ref 135–145)
TCO2: 20 mmol/L — ABNORMAL LOW (ref 22–32)

## 2023-09-21 LAB — I-STAT CG4 LACTIC ACID, ED: Lactic Acid, Venous: 0.7 mmol/L (ref 0.5–1.9)

## 2023-09-21 LAB — BRAIN NATRIURETIC PEPTIDE: B Natriuretic Peptide: 553.3 pg/mL — ABNORMAL HIGH (ref 0.0–100.0)

## 2023-09-21 MED ORDER — FUROSEMIDE 10 MG/ML IJ SOLN
80.0000 mg | Freq: Once | INTRAMUSCULAR | Status: AC
  Administered 2023-09-21: 80 mg via INTRAVENOUS
  Filled 2023-09-21: qty 8

## 2023-09-21 NOTE — ED Triage Notes (Signed)
 Pt had surgery on Tuesday, valve replacement and was discharged on Thursday.  This evening she became very SOB w/ home O2 stats in the low 80s.  O2 is appropriate in triage, "its hard to get a good breath of air."

## 2023-09-21 NOTE — ED Notes (Signed)
 Patient transported to X-ray

## 2023-09-21 NOTE — ED Provider Notes (Signed)
 [place] EMERGENCY DEPARTMENT AT [facility] Provider Note   CSN: [id number] Arrival date & time: 09/21/23  0107     History  Chief Complaint  Patient presents with   Shortness of Breath    QUANEISHA HANISCH is a 73 y.o. female.  The history is provided by the patient, the spouse and medical records.  Shortness of Breath ZELLA DEWAN is a 73 y.o. female who presents to the Emergency Department complaining of difficulty breathing.  She presents to the emergency department accompanied by her husband for evaluation of difficulty breathing that started around midnight and woke her from sleep.  She has associated clear sputum production.  She complains of associated chest heaviness.  No fever, abdominal pain, nausea, vomiting.  She has been taking her medications as directed.  She was discharged yesterday following TAVR on April 22.  She was doing well post operatively.  Oxygen sats were checked at home and they were 87 to 88% on home pulse oximeter.  At time of ED arrival her symptoms are partially improved but not completely resolved.     Home Medications Prior to Admission medications   Medication Sig Start Date End Date Taking? Authorizing Provider  amLODipine  (NORVASC ) 10 MG tablet Take 1 tablet (10 mg total) by mouth daily. 07/12/23  Yes   Artificial Tears ophthalmic solution Place 1 drop into both eyes in the morning and at bedtime.   Yes [provider]  atorvastatin  (LIPITOR) 10 MG tablet Take 2 tablets (20 mg total) by mouth daily for cholesterol. 07/12/23  Yes   Calcium  Carb-Cholecalciferol (CALCIUM  600 + D PO) Take 1 tablet by mouth 2 (two) times daily.   Yes [provider]  Cholecalciferol (VITAMIN D3) 1000 units CAPS Take 1,000 Units by mouth daily.   Yes [provider]  cyanocobalamin (VITAMIN B12) 1000 MCG tablet Take 1,000 mcg by mouth daily.   Yes [provider]  dorzolamide  (TRUSOPT ) 2 % ophthalmic solution Place 1  drop into the left eye 2 (two) times daily. 12/25/18  Yes [provider]  Ferrous Sulfate  (IRON) 325 (65 Fe) MG TABS Take 325 mg by mouth 2 (two) times daily.   Yes [provider]  insulin  glargine (LANTUS  SOLOSTAR) 100 UNIT/ML Solostar Pen Inject 34 Units into the skin daily. 07/12/23  Yes   latanoprost  (XALATAN ) 0.005 % ophthalmic solution Place 1 drop into both eyes at bedtime.  12/25/18  Yes [provider]  losartan  (COZAAR ) 50 MG tablet Take 1 tablet (50 mg total) by mouth daily for blood pressure. 07/12/23  Yes   methocarbamol  (ROBAXIN ) 500 MG tablet Take 2 tablets (1,000 mg total) by mouth daily as needed for muscle spasms. Patient taking differently: Take 1,000 mg by mouth daily. 07/12/23  Yes   metoprolol  tartrate (LOPRESSOR ) 25 MG tablet Take 1 tablet (25 mg total) by mouth 2 (two) times daily. 07/19/23  Yes Patwardhan, Kaye Parsons, MD  pregabalin  (LYRICA ) 75 MG capsule Take 1 capsule (75 mg total) by mouth 2 (two) times daily. Patient taking differently: Take 150 mg by mouth daily. 07/12/23  Yes   pyridOXINE (VITAMIN B-6) 100 MG tablet Take 100 mg by mouth daily.   Yes [provider]  Thiamine HCl (VITAMIN B-1) 250 MG tablet Take 250 mg by mouth daily.   Yes [provider]  timolol  (TIMOPTIC ) 0.5 % ophthalmic solution Place 1 drop into the left eye 2 (two) times daily. 12/26/18  Yes [provider]  torsemide  (DEMADEX ) 20 MG tablet Take 2 tablets (40 mg total) by mouth 2 (two) times daily. Patient taking differently: Take 20-40 mg by mouth See admin instructions. Take 40 mg in the morning and 20-40 mg in the afternoon 07/19/23  Yes Patwardhan, Manish J, MD  traMADol  (ULTRAM ) 50 MG tablet Take 1 tablet (50 mg total) by mouth every 6 (six) hours as needed for moderate pain 07/12/23  Yes   XARELTO  15 MG TABS tablet TAKE 1 TABLET EVERY DAY WITH SUPPER 10/27/22  Yes Patwardhan, Manish J, MD  Alcohol  Swabs  (ALCOHOL  PREP) 70 % PADS Use as directed  aily 07/16/23     Blood Glucose Monitoring Suppl (ONETOUCH VERIO FLEX SYSTEM) w/Device KIT Use as directed. 09/05/23   Elida Grounds, DO  glucose blood (ONETOUCH VERIO) test strip For use when checking blood sugars twice daily. 09/05/23   Elida Grounds, DO  Insulin  Pen Needle (DROPLET PEN NEEDLES) 32G X 6 MM MISC Use to inject insulin  once daily. 07/16/23     Lancets (ONETOUCH DELICA PLUS LANCET33G) MISC Use to check blood sugar daily. 07/16/23     metFORMIN  (GLUCOPHAGE ) 1000 MG tablet Take 1 tablet (1,000 mg total) by mouth 2 (two) times daily with a meal. Patient not taking: Reported on 09/21/2023 07/12/23     nitroGLYCERIN  (NITROSTAT ) 0.4 MG SL tablet Place 0.4 mg under the tongue every 5 (five) minutes as needed for chest pain. Patient not taking: Reported on 09/21/2023    [provider]      Allergies    Patient has no known allergies.    Review of Systems   Review of Systems  Respiratory:  Positive for shortness of breath.   All other systems reviewed and are negative.   Physical Exam Updated Vital Signs BP (!) 132/59   Pulse 82   Temp 98.3 F (36.8 C)   Resp 19   SpO2 93%  Physical Exam Vitals and nursing note reviewed.  Constitutional:      Appearance: She is well-developed.  HENT:     Head: Normocephalic and atraumatic.  Cardiovascular:     Rate and Rhythm: Normal rate and regular rhythm.     Heart sounds: No murmur heard. Pulmonary:     Effort: Pulmonary effort is normal. No respiratory distress.     Breath sounds: Normal breath sounds.  Abdominal:     Palpations: Abdomen is soft.     Tenderness: There is no abdominal tenderness. There is no guarding or rebound.  Musculoskeletal:        General: No swelling or tenderness.  Skin:    General: Skin is warm and dry.     Coloration: Skin is pale.  Neurological:     Mental Status: She is alert and oriented to person, place, and time.  Psychiatric:        Behavior: Behavior normal.     ED Results / Procedures  / Treatments   Labs (all labs ordered are listed, but only abnormal results are displayed) Labs Reviewed  BASIC METABOLIC PANEL WITH GFR - Abnormal; Notable for the following components:      Result Value   Sodium 129 (*)    CO2 19 (*)    Glucose, Bld 213 (*)    BUN 31 (*)    Creatinine, Ser 1.65 (*)    GFR, Estimated 33 (*)    All other components within normal limits  CBC - Abnormal; Notable for the following components:   WBC 13.7 (*)  RBC 2.97 (*)    Hemoglobin 9.1 (*)    HCT 27.9 (*)    All other components within normal limits  HEPATIC FUNCTION PANEL - Abnormal; Notable for the following components:   Albumin 3.0 (*)    All other components within normal limits  BRAIN NATRIURETIC PEPTIDE - Abnormal; Notable for the following components:   B Natriuretic Peptide 553.3 (*)    All other components within normal limits  I-STAT CHEM 8, ED - Abnormal; Notable for the following components:   Sodium 128 (*)    BUN 38 (*)    Creatinine, Ser 1.80 (*)    Glucose, Bld 212 (*)    TCO2 20 (*)    Hemoglobin 8.8 (*)    HCT 26.0 (*)    All other components within normal limits  TROPONIN I (HIGH SENSITIVITY) - Abnormal; Notable for the following components:   Troponin I (High Sensitivity) 130 (*)    All other components within normal limits  TROPONIN I (HIGH SENSITIVITY) - Abnormal; Notable for the following components:   Troponin I (High Sensitivity) 136 (*)    All other components within normal limits  I-STAT CG4 LACTIC ACID, ED    EKG EKG Interpretation Date/Time:  Friday September 21 2023 02:02:34 EDT Ventricular Rate:  83 PR Interval:  284 QRS Duration:  159 QT Interval:  412 QTC Calculation: 485 R Axis:   -16  Text Interpretation: Sinus or ectopic atrial rhythm Prolonged PR interval Left bundle branch block Confirmed by Kelsey Patricia [phone]) on 09/21/2023 2:24:14 AM  Radiology DG Chest Port 1 View Result Date: 09/21/2023 CLINICAL DATA:  Shortness of breath EXAM:  PORTABLE CHEST 1 VIEW COMPARISON:  09/14/2023 FINDINGS: Mild cardiomegaly. Aortic valvuloplasty. Right basilar atelectasis. No focal airspace consolidation or pulmonary edema. IMPRESSION: Mild cardiomegaly and right basilar atelectasis. Electronically Signed   By: Juanetta Nordmann M.D.   On: 09/21/2023 03:03   ECHOCARDIOGRAM COMPLETE Result Date: 09/19/2023    ECHOCARDIOGRAM REPORT   Patient Name:   MARICELLA FILYAW Date of Exam: 09/19/2023 Medical Rec #:  [id number]          Height:       61.5 in Accession #:    [id number]         Weight:       180.5 lb Date of Birth:  [birth year]          BSA:          1.819 m Patient Age:    73 years           BP:           138/75 mmHg Patient Gender: F                  HR:           95 bpm. Exam Location:  Inpatient Procedure: 2D Echo, Cardiac Doppler and Color Doppler (Both Spectral and Color            Flow Doppler were utilized during procedure). Indications:    Post TAVR evaluation  History:        Patient has prior history of Echocardiogram examinations, most                 recent 04/19/2024. CHF, Arrythmias:Atrial Fibrillation; Risk                 Factors:Dyslipidemia, Diabetes and Hypertension.  Aortic Valve: 26 mm Sapien prosthetic, stented (TAVR) valve is                 present in the aortic position.  Sonographer:    Juanita Shaw Referring Phys: [id number] KATHRYN R THOMPSON IMPRESSIONS  1. Left ventricular ejection fraction, by estimation, is 60 to 65%. The left ventricle has normal function. The left ventricle has no regional wall motion abnormalities. The left ventricular internal cavity size was mildly dilated. Left ventricular diastolic parameters are indeterminate. Elevated left ventricular end-diastolic pressure.  2. Right ventricular systolic function is normal. The right ventricular size is normal. Tricuspid regurgitation signal is inadequate for assessing PA pressure.  3. The mitral valve is degenerative. Mild mitral valve regurgitation. No  evidence of mitral stenosis. The mean mitral valve gradient is 4.0 mmHg.  4. The aortic valve has been repaired/replaced. Aortic valve regurgitation is not visualized. No aortic stenosis is present. There is a 26 mm Sapien prosthetic (TAVR) valve present in the aortic position. Aortic valve area, by VTI measures 2.54 cm. Aortic valve mean gradient measures 5.8 mmHg. Aortic valve Vmax measures 1.70 m/s.  5. The inferior vena cava is normal in size with greater than 50% respiratory variability, suggesting right atrial pressure of 3 mmHg.  6. Compared to study dated 09/18/2023, the mean TAVR gradient has increased slightly from 3 to 5.36mmHg, DI has increased from 0.47 to 0.81. Normal functioning TAVR with no perivalular leak. FINDINGS  Left Ventricle: Left ventricular ejection fraction, by estimation, is 60 to 65%. The left ventricle has normal function. The left ventricle has no regional wall motion abnormalities. The left ventricular internal cavity size was mildly dilated. There is  no left ventricular hypertrophy. Left ventricular diastolic parameters are indeterminate. Elevated left ventricular end-diastolic pressure. Right Ventricle: The right ventricular size is normal. No increase in right ventricular wall thickness. Right ventricular systolic function is normal. Tricuspid regurgitation signal is inadequate for assessing PA pressure. Left Atrium: Left atrial size was normal in size. Right Atrium: Right atrial size was normal in size. Pericardium: There is no evidence of pericardial effusion. Mitral Valve: The mitral valve is degenerative in appearance. There is mild calcification of the anterior mitral valve leaflet(s). Mild mitral annular calcification. Mild mitral valve regurgitation. No evidence of mitral valve stenosis. MV peak gradient,  7.1 mmHg. The mean mitral valve gradient is 4.0 mmHg. Tricuspid Valve: The tricuspid valve is normal in structure. Tricuspid valve regurgitation is not demonstrated. No  evidence of tricuspid stenosis. Aortic Valve: The aortic valve has been repaired/replaced. Aortic valve regurgitation is not visualized. No aortic stenosis is present. Aortic valve mean gradient measures 5.8 mmHg. Aortic valve peak gradient measures 11.5 mmHg. Aortic valve area, by VTI  measures 2.54 cm. There is a 26 mm Sapien prosthetic, stented (TAVR) valve present in the aortic position. Pulmonic Valve: The pulmonic valve was normal in structure. Pulmonic valve regurgitation is not visualized. No evidence of pulmonic stenosis. Aorta: The aortic root is normal in size and structure. Venous: The inferior vena cava is normal in size with greater than 50% respiratory variability, suggesting right atrial pressure of 3 mmHg. IAS/Shunts: No atrial level shunt detected by color flow Doppler.  LEFT VENTRICLE PLAX 2D LVIDd:         5.50 cm      Diastology LVIDs:         3.40 cm      LV e' medial:    4.90 cm/s LV PW:  0.80 cm      LV E/e' medial:  26.3 LV IVS:        0.90 cm      LV e' lateral:   7.83 cm/s LVOT diam:     2.00 cm      LV E/e' lateral: 16.5 LV SV:         70 LV SV Index:   39 LVOT Area:     3.14 cm  LV Volumes (MOD) LV vol d, MOD A2C: 103.0 ml LV vol d, MOD A4C: 116.0 ml LV vol s, MOD A2C: 42.7 ml LV vol s, MOD A4C: 42.0 ml LV SV MOD A2C:     60.3 ml LV SV MOD A4C:     116.0 ml LV SV MOD BP:      71.4 ml RIGHT VENTRICLE             IVC RV Basal diam:  3.50 cm     IVC diam: 1.20 cm RV Mid diam:    3.30 cm RV S prime:     12.90 cm/s TAPSE (M-mode): 2.7 cm LEFT ATRIUM             Index        RIGHT ATRIUM           Index LA diam:        4.50 cm 2.47 cm/m   RA Area:     14.00 cm LA Vol (A2C):   49.3 ml 27.10 ml/m  RA Volume:   32.70 ml  17.98 ml/m LA Vol (A4C):   35.1 ml 19.29 ml/m LA Biplane Vol: 41.5 ml 22.81 ml/m  AORTIC VALVE                     PULMONIC VALVE AV Area (Vmax):    2.45 cm      PV Vmax:       1.05 m/s AV Area (Vmean):   2.40 cm      PV Peak grad:  4.4 mmHg AV Area (VTI):      2.54 cm AV Vmax:           169.60 cm/s AV Vmean:          111.400 cm/s AV VTI:            0.278 m AV Peak Grad:      11.5 mmHg AV Mean Grad:      5.8 mmHg LVOT Vmax:         132.00 cm/s LVOT Vmean:        85.000 cm/s LVOT VTI:          0.224 m LVOT/AV VTI ratio: 0.81  AORTA Ao Root diam: 3.20 cm Ao Asc diam:  3.20 cm MITRAL VALVE MV Area (PHT): 5.46 cm     SHUNTS MV Area VTI:   2.69 cm     Systemic VTI:  0.22 m MV Peak grad:  7.1 mmHg     Systemic Diam: 2.00 cm MV Mean grad:  4.0 mmHg MV Vmax:       1.33 m/s MV Vmean:      100.0 cm/s MV Decel Time: 139 msec MR Peak grad: 117.5 mmHg MR Vmax:      542.00 cm/s MV E velocity: 129.00 cm/s MV A velocity: 151.00 cm/s MV E/A ratio:  0.85 Gaylyn Keas MD Electronically signed by Gaylyn Keas MD Signature Date/Time: 09/19/2023/12:11:19 PM    Final     Procedures Procedures  Medications Ordered in ED Medications  furosemide  (LASIX ) injection 80 mg (80 mg Intravenous Given 09/21/23 0417)    ED Course/ Medical Decision Making/ A&P                                 Medical Decision Making Amount and/or Complexity of Data Reviewed Labs: ordered. Radiology: ordered.   Patient with recent TAVR here for evaluation of shortness of breath that started abruptly.  Patient breathing comfortably on room air.  Troponins are elevated in the 130s.  Suspect this is secondary to recent procedure and not ACS.  EKG is stable.  Chest x-ray without acute pulmonary edema.  He she does have a mild AKI with mild hyponatremia, CBC with stable anemia.  Cardiology consulted for additional recommendations.  She was evaluated by cardiology in the emergency department with plan for IV diuresis.  Husband and patient do not want to stay in the hospital.  Patient reports feeling very well after diuresis.  She is able to ambulate without difficulty.  Plan to discharge with close cardiology follow-up on Monday.  Discussed return precautions for concerning or progressive  symptoms.        Final Clinical Impression(s) / ED Diagnoses Final diagnoses:  Acute congestive heart failure, unspecified heart failure type (HCC)  AKI (acute kidney injury) (HCC)  Hyponatremia    Rx / DC Orders ED Discharge Orders     None         Kelsey Patricia, MD 09/21/23 [phone]

## 2023-09-21 NOTE — ED Notes (Addendum)
 Nt Called CCMD @ 3:15AM

## 2023-09-21 NOTE — Progress Notes (Signed)
 Cardiology brief note  Danielle Beck is a 73 y.o. female with a history of CAD-two-vessel nonobstructive, T2DM, HTN, HLD, PAF on Xarelto , CKD stage IIIb, obesity, spinal stenosis and pLFLG AS s/p TAVR with 26 mm SAPIEN 3 on 09/18/2023 discharged on 09/19/23 comes in with complaints of shortness of breath, exertional dyspnea and chest pain   Patient gives history suggestive of congestive heart failure with orthopnea, exertional shortness of breath and chest tightness, hypoxia oxygen saturation dropping to 88%. Patient husband who is a retired Development worker, community at bedside who also gives more history.  Upon evaluation here her oxygen saturations did drop to 88% but now hovering over 90%, blood pressure is stable. On exam she has visible JVD, bilateral crackles, trace pitting edema. Labs show hyponatremia sodium 128, creatinine is up, 1.8, chest x-ray with pulmonary vascular congestion with mild atelectasis. Troponin x 2 is 130/136  EKG is chronic left bundle branch block. Echocardiogram obtained on 09/19/2023 shows EF 60 to 65%, RV normal in size, stable TAVR valve, no pericardial effusion  Assessment and plan. Acute on chronic HFpEF, EF 60 to 65%. AKI creatinine 1.8 baseline creatinine 1.4. Hyponatremia 128 from volume overload. Elevated troponin flat 130/136. S/p TAVR 26 mm SAPIEN S on 09/18/2023.    My recommendation is to admit for further diuresis.  However patient and husband are hesitant.  I gave IV Lasix  80 mg x 1.  Please reassessed her and ambulate no improvement or weak or failed then probably best is to get admitted and get his diuresis per day and discharged the next day. Patient husband is a retired physician at bedside who wants her home and has outpatient appointment on Monday with TAVR team.  This is reasonable if she improves and no hypoxia or shortness of breath. I am recommending to increase torsemide  to 60 mg daily.  Please reassess her in the morning and if no improvement  call cardiology for admission

## 2023-09-24 ENCOUNTER — Ambulatory Visit: Attending: Physician Assistant | Admitting: Physician Assistant

## 2023-09-24 ENCOUNTER — Other Ambulatory Visit (HOSPITAL_COMMUNITY): Payer: Self-pay

## 2023-09-24 ENCOUNTER — Other Ambulatory Visit: Payer: Self-pay

## 2023-09-24 ENCOUNTER — Encounter: Payer: Self-pay | Admitting: Physician Assistant

## 2023-09-24 VITALS — BP 110/56 | HR 78 | Ht 61.5 in | Wt 190.4 lb

## 2023-09-24 DIAGNOSIS — E782 Mixed hyperlipidemia: Secondary | ICD-10-CM

## 2023-09-24 DIAGNOSIS — N1832 Chronic kidney disease, stage 3b: Secondary | ICD-10-CM

## 2023-09-24 DIAGNOSIS — Z952 Presence of prosthetic heart valve: Secondary | ICD-10-CM | POA: Diagnosis not present

## 2023-09-24 DIAGNOSIS — I10 Essential (primary) hypertension: Secondary | ICD-10-CM

## 2023-09-24 DIAGNOSIS — I480 Paroxysmal atrial fibrillation: Secondary | ICD-10-CM | POA: Diagnosis not present

## 2023-09-24 DIAGNOSIS — R911 Solitary pulmonary nodule: Secondary | ICD-10-CM

## 2023-09-24 DIAGNOSIS — I5032 Chronic diastolic (congestive) heart failure: Secondary | ICD-10-CM

## 2023-09-24 MED ORDER — AMOXICILLIN 500 MG PO CAPS
2000.0000 mg | ORAL_CAPSULE | ORAL | 12 refills | Status: AC
  Filled 2023-09-24: qty 12, 3d supply, fill #0
  Filled 2023-09-26 – 2023-09-27 (×2): qty 12, 3d supply, fill #1
  Filled 2024-01-10: qty 12, 3d supply, fill #2
  Filled 2024-01-22: qty 12, 3d supply, fill #3
  Filled 2024-02-20: qty 12, 3d supply, fill #4
  Filled 2024-04-08 (×2): qty 12, 3d supply, fill #5
  Filled 2024-05-20 – 2024-05-27 (×2): qty 12, 3d supply, fill #6
  Filled 2024-07-01: qty 12, 3d supply, fill #7

## 2023-09-24 NOTE — Progress Notes (Addendum)
 [facility]   MULTIDISCIPLINARY HEART VALVE CLINIC                                     Cardiology Office Note:    Date:  09/28/2023   ID:  [name], DOB 10/30/1950, MRN [id number]  PCP:  Elida Grounds, DO  [facility] Cardiologist:  Cody Das, MD  [facility] Structural heart: Antoinette Batman, MD [facility] Electrophysiologist:  None   Referring MD: Elida Grounds, DO   TOC s/p TAVR  History of Present Illness:    Danielle Beck is a 73 y.o. female with a hx of CAD, T2DM, HTN, HLD, PAF on Xarelto , CKD stage IIIb, obesity, HFpEF, spinal stenosis and pLFLG AS s/p TAVR (09/18/23) who presents to clinic for follow up.    She has been followed over time for aortic stenosis. Echo 07/23/23 showed EF 55% and severe pLFLG AS with a mean grad 31 mmHg, AVA 0.56 cm2, DVI 0.20,and mild-mod MR. [facility] 08/03/23 showed moderate non obst CAD. She reported increasing DOE and chest pressure.  S/p successful TAVR with a 26 mm Edwards Sapien 3 Ultra Resilia THV via the TF approach on 09/18/23. Post operative echo showed EF 60%, normally functioning TAVR with a mean gradient of 5.8 mmHg and no PVL as well as mild MR and elevated filling pressures. LVEDP was noted to be mildly elevated at 19 mm hg. She was felt to be euvolemic and resumed on home torsemide .   She then presented to the [facility] ED on 09/21/23 acute CHF that responded quickly to Lasix  80 mg x1. Pt's husband who is a retired MD pushed for discharge home with Torsemide  60mg  daily (increase from 40mg  daily).   Today the patient presents to clinic for follow up. Here with husband who is retired Careers adviser. She is back to her baseline weight of 183 lbs at home which is followed very closely.  Doing much better since IV lasix  in the ER. Has been taking 40mg  torsemide  in the AM and 20mg  in PM. No CP or SOB. No LE edema, orthopnea or PND. No dizziness or syncope. No blood in stool or urine. No palpitations. Does still  have some fatigue and right groin pain.    Past Medical History:  Diagnosis Date   CHF (congestive heart failure) (HCC)    Diabetes mellitus without complication (HCC)    Hyperlipidemia    Hypertension    Severe aortic stenosis      Current Medications: Current Meds  Medication Sig   Alcohol  Swabs  (ALCOHOL  PREP) 70 % PADS Use as directed aily   amLODipine  (NORVASC ) 10 MG tablet Take 1 tablet (10 mg total) by mouth daily.   amoxicillin  (AMOXIL ) 500 MG capsule Take 4 capsules (2,000 mg total) by mouth as directed. 1 hour prior to dental work including cleanings   Artificial Tears ophthalmic solution Place 1 drop into both eyes in the morning and at bedtime.   atorvastatin  (LIPITOR) 10 MG tablet Take 2 tablets (20 mg total) by mouth daily for cholesterol.   Blood Glucose Monitoring Suppl (ONETOUCH VERIO FLEX SYSTEM) w/Device KIT Use as directed.   Calcium  Carb-Cholecalciferol (CALCIUM  600 + D PO) Take 1 tablet by mouth 2 (two) times daily.   Cholecalciferol (VITAMIN D3) 1000 units CAPS Take 1,000 Units by mouth every evening.   cyanocobalamin (VITAMIN B12) 1000 MCG tablet Take 1,000 mcg by  mouth every evening.   dorzolamide  (TRUSOPT ) 2 % ophthalmic solution Place 1 drop into the left eye 2 (two) times daily.   Ferrous Sulfate  (IRON) 325 (65 Fe) MG TABS Take 325 mg by mouth 2 (two) times daily.   glucose blood (ONETOUCH VERIO) test strip For use when checking blood sugars twice daily.   insulin  glargine (LANTUS  SOLOSTAR) 100 UNIT/ML Solostar Pen Inject 34 Units into the skin daily. (Patient taking differently: Inject 30 Units into the skin daily.)   Insulin  Pen Needle (DROPLET PEN NEEDLES) 32G X 6 MM MISC Use to inject insulin  once daily.   Lancets (ONETOUCH DELICA PLUS LANCET33G) MISC Use to check blood sugar daily.   latanoprost  (XALATAN ) 0.005 % ophthalmic solution Place 1 drop into both eyes at bedtime.    losartan  (COZAAR ) 50 MG tablet Take 1 tablet (50 mg total) by mouth daily for  blood pressure.   metFORMIN  (GLUCOPHAGE ) 1000 MG tablet Take 1 tablet (1,000 mg total) by mouth 2 (two) times daily with a meal.   methocarbamol  (ROBAXIN ) 500 MG tablet Take 2 tablets (1,000 mg total) by mouth daily as needed for muscle spasms. (Patient taking differently: Take 1,000 mg by mouth every evening.)   metoprolol  tartrate (LOPRESSOR ) 25 MG tablet Take 1 tablet (25 mg total) by mouth 2 (two) times daily.   nitroGLYCERIN  (NITROSTAT ) 0.4 MG SL tablet Place 0.4 mg under the tongue every 5 (five) minutes as needed for chest pain.   pregabalin  (LYRICA ) 75 MG capsule Take 1 capsule (75 mg total) by mouth 2 (two) times daily. (Patient taking differently: Take 150 mg by mouth every evening.)   pyridOXINE  (VITAMIN B-6) 100 MG tablet Take 100 mg by mouth every evening.   Thiamine  HCl (VITAMIN B-1) 250 MG tablet Take 250 mg by mouth every evening.   timolol  (TIMOPTIC ) 0.5 % ophthalmic solution Place 1 drop into the left eye 2 (two) times daily.   torsemide  (DEMADEX ) 20 MG tablet Take 40 mg by mouth 2 (two) times daily.   traMADol  (ULTRAM ) 50 MG tablet Take 1 tablet (50 mg total) by mouth every 6 (six) hours as needed for moderate pain   XARELTO  15 MG TABS tablet TAKE 1 TABLET EVERY DAY WITH SUPPER      ROS:   Please see the history of present illness.    All other systems reviewed and are negative.  EKGs   EKG Interpretation Date/Time:  Monday September 24 2023 11:12:40 EDT Ventricular Rate:  78 PR Interval:  296 QRS Duration:  160 QT Interval:  458 QTC Calculation: 522 R Axis:   14  Text Interpretation: Sinus rhythm with 1st degree A-V block Left bundle branch block Confirmed by Abagail Hoar [phone]) on 09/24/2023 11:31:08 AM   Risk Assessment/Calculations:    CHA2DS2-VASc Score = 5   This indicates a 7.2% annual risk of stroke. The patient's score is based upon: CHF History: 1 HTN History: 1 Diabetes History: 1 Stroke History: 0 Vascular Disease History: 0 Age Score:  1 Gender Score: 1          Physical Exam:    VS:  BP (!) 110/56   Pulse 78   Ht 5' 1.5" (1.562 m)   Wt 190 lb 6.4 oz (86.4 kg)   SpO2 (!) 78%   BMI 35.39 kg/m     Wt Readings from Last 3 Encounters:  09/28/23 187 lb 13.3 oz (85.2 kg)  09/28/23 192 lb 6.4 oz (87.3 kg)  09/24/23 190 lb 6.4 oz (  86.4 kg)     GEN: Well nourished, well developed in no acute distress NECK: No JVD CARDIAC: RRR, no murmurs, rubs, gallops RESPIRATORY:  Clear to auscultation without rales, wheezing or rhonchi  ABDOMEN: Soft, non-tender, non-distended EXTREMITIES:  No edema; No deformity.  Groin sites clear without hematoma or ecchymosis.   ASSESSMENT:    1. S/P TAVR (transcatheter aortic valve replacement)   2. Essential hypertension   3. Chronic heart failure with preserved ejection fraction (HCC)   4. PAF (paroxysmal atrial fibrillation) (HCC)   5. Mixed hyperlipidemia   6. Stage 3b chronic kidney disease (HCC)   7. Pulmonary nodule     PLAN:    In order of problems listed above:  Severe AS s/p TAVR:  -- Pt doing well s/p TAVR.  -- ECG with no HAVB. ECG with new LBBB/old 1st deg block..  -- Groin sites healing well.  -- SBE prophylaxis discussed; I have RX'd amoxicillin .   -- Continue home Xarelto  15mg  daily. -- Cleared to resume all activities without restriction. -- She will have her 1 month echo on 5/15 and then follow up with Dr. Filiberto Hug 5/19 for follow up.    HTN: -- BP well controlled. -- Continue Norvasc  10mg  daily, Lopressor  25mg  BID and losartan  50mg  daily.    HFpEF: -- Appears euvolemic now.  -- Recently treated in ER with IV lasix  and discharged on increased torsemide .  -- Med list has her on torsemide  40mg  BID but she takes on a slide scale basis.  -- Currently has been taking 40mg  in the AM and 20mg  in PM.  -- Dry weight is 183lbs at home and followed closely by husband.    PAF: -- In sinus by ECG today.  -- Continue Lopressor  25mg  BID. -- Continue Xarelto   15mg  daily.    HLD: -- Continue atorvastatin  10mg  daily.    CKD stage IIIb:  -- Creat up to 1.8 in the ER. -- Discussed getting a BMET today but husband would like to wait and let her settle out a bit. -- Can recheck when seen back by Dr. Patwarhan.    Pulmonary nodule: -- Pre TAVR CTs noted a "single area of subpleural nodularity immediately posterior to the transverse portion of the azygous vein and adjacent to the medial aspect of the major fissure and an adjacent accessory fissure measuring approximately 5 mm. This may represent an intrapulmonary lymph node. No follow-up needed if patient is low-risk." -- This was discussed with her husband and we both agree she is low risk and no follow up necessary.      Cardiac Rehabilitation Eligibility Assessment  The patient is ready to start cardiac rehabilitation from a cardiac standpoint.     Medication Adjustments/Labs and Tests Ordered: Current medicines are reviewed at length with the patient today.  Concerns regarding medicines are outlined above.  Orders Placed This Encounter  Procedures   EKG 12-Lead   Meds ordered this encounter  Medications   amoxicillin  (AMOXIL ) 500 MG capsule    Sig: Take 4 capsules (2,000 mg total) by mouth as directed. 1 hour prior to dental work including cleanings    Dispense:  12 capsule    Refill:  12    Supervising Provider:   Arnoldo Lapping [[id number]]    Patient Instructions  Medication Instructions:  Your physician recommends that you continue on your current medications as directed. Please refer to the Current Medication list given to you today.  *If you need a refill on your cardiac  medications before your next appointment, please call your pharmacy*  Lab Work: NONE If you have labs (blood work) drawn today and your tests are completely normal, you will receive your results only by: MyChart Message (if you have MyChart) OR A paper copy in the mail If you have any lab test that is abnormal  or we need to change your treatment, we will call you to review the results.  Testing/Procedures: NONE  Follow-Up: At [facility], you and your health needs are our priority.  As part of our continuing mission to provide you with exceptional heart care, our providers are all part of one team.  This team includes your primary Cardiologist (physician) and Advanced Practice Providers or APPs (Physician Assistants and Nurse Practitioners) who all work together to provide you with the care you need, when you need it.  Your next appointment:   As scheduled  Provider:     We recommend signing up for the patient portal called "MyChart".  Sign up information is provided on this After Visit Summary.  MyChart is used to connect with patients for Virtual Visits (Telemedicine).  Patients are able to view lab/test results, encounter notes, upcoming appointments, etc.  Non-urgent messages can be sent to your provider as well.   To learn more about what you can do with MyChart, go to [email or web address].   Other Instructions       Signed, Abagail Hoar, PA-C  09/28/2023 8:54 PM    [facility]

## 2023-09-24 NOTE — Patient Instructions (Signed)
 Medication Instructions:  Your physician recommends that you continue on your current medications as directed. Please refer to the Current Medication list given to you today.  *If you need a refill on your cardiac medications before your next appointment, please call your pharmacy*  Lab Work: NONE If you have labs (blood work) drawn today and your tests are completely normal, you will receive your results only by: MyChart Message (if you have MyChart) OR A paper copy in the mail If you have any lab test that is abnormal or we need to change your treatment, we will call you to review the results.  Testing/Procedures: NONE  Follow-Up: At [facility], you and your health needs are our priority.  As part of our continuing mission to provide you with exceptional heart care, our providers are all part of one team.  This team includes your primary Cardiologist (physician) and Advanced Practice Providers or APPs (Physician Assistants and Nurse Practitioners) who all work together to provide you with the care you need, when you need it.  Your next appointment:   As scheduled  Provider:     We recommend signing up for the patient portal called "MyChart".  Sign up information is provided on this After Visit Summary.  MyChart is used to connect with patients for Virtual Visits (Telemedicine).  Patients are able to view lab/test results, encounter notes, upcoming appointments, etc.  Non-urgent messages can be sent to your provider as well.   To learn more about what you can do with MyChart, go to [email or web address].   Other Instructions

## 2023-09-25 ENCOUNTER — Other Ambulatory Visit (HOSPITAL_COMMUNITY): Payer: Self-pay

## 2023-09-25 ENCOUNTER — Other Ambulatory Visit: Payer: Self-pay

## 2023-09-26 ENCOUNTER — Other Ambulatory Visit (HOSPITAL_COMMUNITY): Payer: Self-pay

## 2023-09-26 ENCOUNTER — Telehealth (HOSPITAL_COMMUNITY): Payer: Self-pay

## 2023-09-26 ENCOUNTER — Other Ambulatory Visit: Payer: Self-pay

## 2023-09-26 ENCOUNTER — Other Ambulatory Visit: Payer: Self-pay | Admitting: Physician Assistant

## 2023-09-26 DIAGNOSIS — I5032 Chronic diastolic (congestive) heart failure: Secondary | ICD-10-CM

## 2023-09-26 NOTE — Telephone Encounter (Signed)
 Attempted to contact pt in regards to CR, pt husband Danielle Beck is not on pt DPR. Asked Danielle Beck was pt available for me to talk too, he stated no.

## 2023-09-28 ENCOUNTER — Ambulatory Visit: Attending: Cardiology | Admitting: Cardiology

## 2023-09-28 ENCOUNTER — Encounter: Payer: Self-pay | Admitting: Cardiology

## 2023-09-28 ENCOUNTER — Ambulatory Visit: Payer: PPO | Admitting: Cardiology

## 2023-09-28 ENCOUNTER — Inpatient Hospital Stay (HOSPITAL_COMMUNITY)

## 2023-09-28 ENCOUNTER — Encounter (HOSPITAL_COMMUNITY): Payer: Self-pay

## 2023-09-28 ENCOUNTER — Inpatient Hospital Stay (HOSPITAL_COMMUNITY)
Admission: AD | Admit: 2023-09-28 | Discharge: 2023-10-01 | DRG: 291 | Disposition: A | Discharge status: Home / Self Care | Source: Ambulatory Visit | Attending: Cardiology | Admitting: Cardiology

## 2023-09-28 VITALS — BP 120/58 | HR 81 | Ht 62.0 in | Wt 192.4 lb

## 2023-09-28 DIAGNOSIS — D649 Anemia, unspecified: Secondary | ICD-10-CM | POA: Diagnosis present

## 2023-09-28 DIAGNOSIS — I480 Paroxysmal atrial fibrillation: Secondary | ICD-10-CM | POA: Diagnosis not present

## 2023-09-28 DIAGNOSIS — I2510 Atherosclerotic heart disease of native coronary artery without angina pectoris: Secondary | ICD-10-CM | POA: Insufficient documentation

## 2023-09-28 DIAGNOSIS — N1832 Chronic kidney disease, stage 3b: Secondary | ICD-10-CM | POA: Diagnosis present

## 2023-09-28 DIAGNOSIS — I360 Nonrheumatic tricuspid (valve) stenosis: Secondary | ICD-10-CM

## 2023-09-28 DIAGNOSIS — Z794 Long term (current) use of insulin: Secondary | ICD-10-CM | POA: Diagnosis not present

## 2023-09-28 DIAGNOSIS — E871 Hypo-osmolality and hyponatremia: Secondary | ICD-10-CM | POA: Diagnosis not present

## 2023-09-28 DIAGNOSIS — Z7984 Long term (current) use of oral hypoglycemic drugs: Secondary | ICD-10-CM | POA: Diagnosis not present

## 2023-09-28 DIAGNOSIS — Z7901 Long term (current) use of anticoagulants: Secondary | ICD-10-CM | POA: Diagnosis not present

## 2023-09-28 DIAGNOSIS — R0602 Shortness of breath: Secondary | ICD-10-CM | POA: Diagnosis not present

## 2023-09-28 DIAGNOSIS — E1122 Type 2 diabetes mellitus with diabetic chronic kidney disease: Secondary | ICD-10-CM | POA: Diagnosis not present

## 2023-09-28 DIAGNOSIS — I447 Left bundle-branch block, unspecified: Secondary | ICD-10-CM | POA: Diagnosis present

## 2023-09-28 DIAGNOSIS — I5033 Acute on chronic diastolic (congestive) heart failure: Secondary | ICD-10-CM | POA: Diagnosis not present

## 2023-09-28 DIAGNOSIS — I10 Essential (primary) hypertension: Secondary | ICD-10-CM | POA: Diagnosis present

## 2023-09-28 DIAGNOSIS — Z79899 Other long term (current) drug therapy: Secondary | ICD-10-CM | POA: Diagnosis not present

## 2023-09-28 DIAGNOSIS — I130 Hypertensive heart and chronic kidney disease with heart failure and stage 1 through stage 4 chronic kidney disease, or unspecified chronic kidney disease: Principal | ICD-10-CM | POA: Diagnosis present

## 2023-09-28 DIAGNOSIS — E782 Mixed hyperlipidemia: Secondary | ICD-10-CM | POA: Diagnosis not present

## 2023-09-28 DIAGNOSIS — Z833 Family history of diabetes mellitus: Secondary | ICD-10-CM

## 2023-09-28 DIAGNOSIS — Z952 Presence of prosthetic heart valve: Secondary | ICD-10-CM

## 2023-09-28 DIAGNOSIS — Z953 Presence of xenogenic heart valve: Secondary | ICD-10-CM | POA: Diagnosis not present

## 2023-09-28 DIAGNOSIS — Z9851 Tubal ligation status: Secondary | ICD-10-CM

## 2023-09-28 DIAGNOSIS — I350 Nonrheumatic aortic (valve) stenosis: Secondary | ICD-10-CM | POA: Diagnosis not present

## 2023-09-28 DIAGNOSIS — R0600 Dyspnea, unspecified: Secondary | ICD-10-CM | POA: Diagnosis not present

## 2023-09-28 DIAGNOSIS — R0989 Other specified symptoms and signs involving the circulatory and respiratory systems: Secondary | ICD-10-CM | POA: Diagnosis not present

## 2023-09-28 DIAGNOSIS — E1169 Type 2 diabetes mellitus with other specified complication: Secondary | ICD-10-CM | POA: Diagnosis present

## 2023-09-28 DIAGNOSIS — Z8249 Family history of ischemic heart disease and other diseases of the circulatory system: Secondary | ICD-10-CM

## 2023-09-28 LAB — HEMOGLOBIN A1C
Hgb A1c MFr Bld: 6.5 % — ABNORMAL HIGH (ref 4.8–5.6)
Mean Plasma Glucose: 139.85 mg/dL

## 2023-09-28 LAB — CBC WITH DIFFERENTIAL/PLATELET
Abs Immature Granulocytes: 0.16 10*3/uL — ABNORMAL HIGH (ref 0.00–0.07)
Basophils Absolute: 0.1 10*3/uL (ref 0.0–0.1)
Basophils Relative: 0 %
Eosinophils Absolute: 0.3 10*3/uL (ref 0.0–0.5)
Eosinophils Relative: 3 %
HCT: 25.6 % — ABNORMAL LOW (ref 36.0–46.0)
Hemoglobin: 8.7 g/dL — ABNORMAL LOW (ref 12.0–15.0)
Immature Granulocytes: 1 %
Lymphocytes Relative: 19 %
Lymphs Abs: 2.1 10*3/uL (ref 0.7–4.0)
MCH: 30.2 pg (ref 26.0–34.0)
MCHC: 34 g/dL (ref 30.0–36.0)
MCV: 88.9 fL (ref 80.0–100.0)
Monocytes Absolute: 0.8 10*3/uL (ref 0.1–1.0)
Monocytes Relative: 7 %
Neutro Abs: 7.8 10*3/uL — ABNORMAL HIGH (ref 1.7–7.7)
Neutrophils Relative %: 70 %
Platelets: 251 10*3/uL (ref 150–400)
RBC: 2.88 MIL/uL — ABNORMAL LOW (ref 3.87–5.11)
RDW: 12.8 % (ref 11.5–15.5)
WBC: 11.2 10*3/uL — ABNORMAL HIGH (ref 4.0–10.5)
nRBC: 0 % (ref 0.0–0.2)

## 2023-09-28 LAB — GLUCOSE, CAPILLARY
Glucose-Capillary: 164 mg/dL — ABNORMAL HIGH (ref 70–99)
Glucose-Capillary: 163 mg/dL — ABNORMAL HIGH (ref 70–99)

## 2023-09-28 LAB — LACTIC ACID, PLASMA
Lactic Acid, Venous: 4.5 mmol/L (ref 0.5–1.9)
Lactic Acid, Venous: 3.3 mmol/L (ref 0.5–1.9)

## 2023-09-28 LAB — BRAIN NATRIURETIC PEPTIDE: B Natriuretic Peptide: 303 pg/mL — ABNORMAL HIGH (ref 0.0–100.0)

## 2023-09-28 MED ORDER — IPRATROPIUM-ALBUTEROL 0.5-2.5 (3) MG/3ML IN SOLN: 3.0000 mL | Freq: Four times a day (QID) | RESPIRATORY_TRACT | Status: DC | PRN

## 2023-09-28 MED ORDER — THIAMINE MONONITRATE 100 MG PO TABS
250.0000 mg | ORAL_TABLET | Freq: Every day | ORAL | Status: DC
  Administered 2023-09-29 – 2023-10-01 (×3): 250 mg via ORAL
  Filled 2023-09-28 (×5): qty 3

## 2023-09-28 MED ORDER — METOLAZONE 5 MG PO TABS
2.5000 mg | ORAL_TABLET | Freq: Once | ORAL | Status: AC
  Administered 2023-09-28: 2.5 mg via ORAL
  Filled 2023-09-28: qty 1

## 2023-09-28 MED ORDER — SODIUM CHLORIDE 0.9% FLUSH: 3.0000 mL | INTRAVENOUS | Status: DC | PRN

## 2023-09-28 MED ORDER — PREGABALIN 25 MG PO CAPS
75.0000 mg | ORAL_CAPSULE | Freq: Two times a day (BID) | ORAL | Status: DC
  Administered 2023-09-28 – 2023-10-01 (×6): 75 mg via ORAL
  Filled 2023-09-28 (×6): qty 3

## 2023-09-28 MED ORDER — AMLODIPINE BESYLATE 10 MG PO TABS: 10.0000 mg | ORAL_TABLET | Freq: Every day | ORAL | Status: DC

## 2023-09-28 MED ORDER — INSULIN ASPART 100 UNIT/ML IJ SOLN: 0.0000 [IU] | Freq: Three times a day (TID) | INTRAMUSCULAR | Status: DC

## 2023-09-28 MED ORDER — METHOCARBAMOL 500 MG PO TABS
1000.0000 mg | ORAL_TABLET | Freq: Every day | ORAL | Status: DC | PRN
  Administered 2023-09-28 – 2023-09-29 (×2): 1000 mg via ORAL
  Filled 2023-09-28 (×2): qty 2

## 2023-09-28 MED ORDER — INSULIN ASPART 100 UNIT/ML IJ SOLN: 3.0000 [IU] | Freq: Three times a day (TID) | INTRAMUSCULAR | Status: DC

## 2023-09-28 MED ORDER — SODIUM CHLORIDE 0.9% FLUSH
3.0000 mL | Freq: Two times a day (BID) | INTRAVENOUS | Status: DC
  Administered 2023-09-28 – 2023-10-01 (×6): 3 mL via INTRAVENOUS

## 2023-09-28 MED ORDER — ACETAMINOPHEN 325 MG PO TABS: 650.0000 mg | ORAL_TABLET | ORAL | Status: DC | PRN

## 2023-09-28 MED ORDER — FUROSEMIDE 10 MG/ML IJ SOLN
80.0000 mg | Freq: Two times a day (BID) | INTRAMUSCULAR | Status: DC
  Administered 2023-09-28 – 2023-09-30 (×4): 80 mg via INTRAVENOUS
  Filled 2023-09-28 (×4): qty 8

## 2023-09-28 MED ORDER — AMLODIPINE BESYLATE 10 MG PO TABS
10.0000 mg | ORAL_TABLET | Freq: Every day | ORAL | Status: DC
Start: 2023-09-29 — End: 2023-10-01
  Administered 2023-09-29 – 2023-10-01 (×3): 10 mg via ORAL
  Filled 2023-09-28 (×3): qty 1

## 2023-09-28 MED ORDER — ATORVASTATIN CALCIUM 10 MG PO TABS
20.0000 mg | ORAL_TABLET | Freq: Every day | ORAL | Status: DC
  Administered 2023-09-29 – 2023-10-01 (×3): 20 mg via ORAL
  Filled 2023-09-28 (×3): qty 2

## 2023-09-28 MED ORDER — INSULIN GLARGINE-YFGN 100 UNIT/ML ~~LOC~~ SOLN
30.0000 [IU] | Freq: Every day | SUBCUTANEOUS | Status: DC
  Administered 2023-09-29 – 2023-10-01 (×3): 30 [IU] via SUBCUTANEOUS
  Filled 2023-09-28 (×3): qty 0.3

## 2023-09-28 MED ORDER — RIVAROXABAN 15 MG PO TABS
15.0000 mg | ORAL_TABLET | Freq: Every day | ORAL | Status: DC
Start: 2023-09-28 — End: 2023-10-01
  Administered 2023-09-28 – 2023-09-30 (×3): 15 mg via ORAL
  Filled 2023-09-28 (×4): qty 1

## 2023-09-28 MED ORDER — FERROUS SULFATE 325 (65 FE) MG PO TABS
325.0000 mg | ORAL_TABLET | Freq: Two times a day (BID) | ORAL | Status: DC
  Administered 2023-09-28 – 2023-10-01 (×6): 325 mg via ORAL
  Filled 2023-09-28 (×6): qty 1

## 2023-09-28 MED ORDER — LOSARTAN POTASSIUM 50 MG PO TABS
50.0000 mg | ORAL_TABLET | Freq: Every day | ORAL | Status: DC
  Administered 2023-09-29 – 2023-10-01 (×3): 50 mg via ORAL
  Filled 2023-09-28 (×3): qty 1

## 2023-09-28 MED ORDER — ONDANSETRON HCL 4 MG/2ML IJ SOLN: 4.0000 mg | Freq: Four times a day (QID) | INTRAMUSCULAR | Status: DC | PRN

## 2023-09-28 MED ORDER — VITAMIN B-6 100 MG PO TABS
100.0000 mg | ORAL_TABLET | Freq: Every day | ORAL | Status: DC
  Administered 2023-09-29 – 2023-10-01 (×3): 100 mg via ORAL
  Filled 2023-09-28 (×4): qty 1

## 2023-09-28 MED ORDER — FUROSEMIDE 10 MG/ML IJ SOLN: 40.0000 mg | Freq: Two times a day (BID) | INTRAMUSCULAR | Status: DC

## 2023-09-28 MED ORDER — LOSARTAN POTASSIUM 50 MG PO TABS: 50.0000 mg | ORAL_TABLET | Freq: Every day | ORAL | Status: DC

## 2023-09-28 MED ORDER — SODIUM CHLORIDE 0.9 % IV SOLN: 250.0000 mL | INTRAVENOUS | Status: AC | PRN

## 2023-09-28 NOTE — Progress Notes (Signed)
 Cardiology Office Note:  .   Date:  09/28/2023  ID:  Danielle Beck, DOB 08/10/1950, MRN [id number] PCP: Danielle Grounds, DO  [facility] Cardiologist:  Danielle Ivy, MD PCP: Danielle Grounds, DO  Chief Complaint  Patient presents with   Shortness of Breath    With simple exertion.       History of Present Illness: .    Danielle Beck is a 73 y.o. female with hypertension, diabetes mellitus, HFpEF, paroxysmal Afib/flutter, PSVT, s/p TAVR Danielle Beck 26 mm) 08/2023  Patient has had at least 1 ER visit since her TAVR with worsening shortness of breath and leg edema that temporarily improved with IV Lasix  during ER visit.  She was seen 4 days ago for TAVR follow-up, but has had further worsening of shortness of breath and leg swelling since then.  She has episodes of waking up middle of night around 3 AM short of breath associated with chest heaviness, having to sit up. Today, she is labored with completing her sentences.  Reviewed recent lab results and chest x-ray, details below   Vitals:   09/28/23 1315  BP: (!) 120/58  Pulse: 81  SpO2: 93%       ROS:  Review of Systems  Cardiovascular:  Positive for chest pain, dyspnea on exertion, leg swelling, orthopnea and paroxysmal nocturnal dyspnea. Negative for palpitations and syncope.     Studies Reviewed: Aaron Aas       EKG 09/28/2023: Sinus rhythm with 1st degree A-V block Left bundle branch block When compared with ECG of 24-Sep-2023 11:12, No significant change was found     CXR 09/21/2023: Mild cardiomegaly and right basilar atelectasis.    TAVR note 09/18/2023: Edwards Sapien 3 THV (size 26 mm)  Coronary angiography 08/03/2023: LM: Normal LAD: Mild calcification          Prox-mid 30% disease Lcx: Large vessel        50% mid stenosis between ectatic segment on either side RCA: Mild calcification with mild diffuse disease      Right heart catheterization 08/03/2023: RA: 9 mmHg RV: 44/4  mmHg PA: 42/20 mmHg, mPAP 36 mmHg PCW: 24 mmHg   AO sats: 92% PA sats: 57%   CO: 5.0 L/min CI: 2.7 L/min/m2   Two vessel nonobstructive CAD No proximal CAD Moderate PH, WHO Grp II Mildly elevated filling pressures  Independently interpreted 09/21/2023: Hb 8.8 Cr 1.8 Trop HS 136,130 BNP 553 (663 on 08/19/2023)  06/2023: eGFR 26 Hb 10  05/2023: Chol 172, TG 142, HDL 58, LDL 90 HbA1C 6.3% Cr 1.67, eGFR 32, K 5.0  External labs 12/2022: Creatinine 1.67, EGFR 33. LDL 90  EKG 08/31/2022: Sinus rhythm 79 bpm LAFB LVH Possible old anteroseptal infarct  Independently interpreted Echocardiogram 09/19/2023: LVEF 60 to 65%.  Mild LV cavity dilatation.  Elevated LVEDP.  Indeterminate diastolic function. Normal RV systolic size and function. Mild mitral regurgitation. TAVR valve in aortic position.  Mean gradient 6 mmHg, V-max 1.7 m/s.  No paravalvular leak    Echocardiogram 07/24/2023: Mild LVH.  LVEF 55 to 60%.  Grade 2 diastolic dysfunction.  GLS -18%. Normal RV systolic function.  Estimated RVSP 35 Danielle Beck. Mild to moderate mitral regurgitation. Severely calcified aortic valve.  Severe arctic stenosis.  V-max 3.5 m/s, mean gradient 31 mmHg, AVA 0.6 cm by continued equation, dimensionless index 0.19.  Low stroke-volume index 28 cc/m.  Findings consistent with paradoxical low-flow low gradient severe aortic stenosis. In comparison, previous echocardiogram in 02/2019  for reported mean pressure gradient 25 mmHg, AVA 0.75 cm,   1. Left ventricular ejection fraction, by estimation, is 55 to 60%. Left  ventricular ejection fraction by 3D volume is 56 %. The left ventricle has  normal function. The left ventricle has no regional wall motion  abnormalities. There is mild left  ventricular hypertrophy. Left ventricular diastolic parameters are  consistent with Grade II diastolic dysfunction (pseudonormalization).  Elevated left atrial pressure. The average left ventricular global   longitudinal strain is -18.0 %. The global  longitudinal strain is normal.   2. Right ventricular systolic function is normal. The right ventricular  size is normal. There is normal pulmonary artery systolic pressure. The  estimated right ventricular systolic pressure is 34.6 mmHg.   3. The mitral valve is degenerative. Mild to moderate mitral valve  regurgitation.   4. The inferior vena cava is normal in size with greater than 50%  respiratory variability, suggesting right atrial pressure of 3 mmHg.   5. The aortic valve is calcified. There is severe calcifcation of the  aortic valve. Aortic valve regurgitation is trivial. Severe aortic valve  stenosis. Moderate AS by gradients (Vmax 3.5 m/s, MG ), severe by  AVA (0.6 cm^2) and DI (0.19). Low SV  index (28 cc/m^2). Consistent with paradoxical low flow low gradient  severe AS    Echocardiogram 08/21/2022: 1. Left ventricular ejection fraction, by estimation, is 55 to 60%. The  left ventricle has normal function. The left ventricle has no regional  wall motion abnormalities. Left ventricular diastolic parameters are  indeterminate.   2. Right ventricular systolic function is normal. The right ventricular  size is normal.   3. The aortic valve is calcified. Moderate aortic valve stenosis. Aortic  valve area, by VTI measures 1.05 cm. Aortic valve mean gradient measures  18.0 mmHg. Aortic valve Vmax measures 2.71 m/s.   4. The inferior vena cava is normal in size with <50% respiratory  variability, suggesting right atrial pressure of 8 mmHg.   Comparison(s): A prior study was performed on 04/06/2022. AS and MR are no  worse than before.    Lexiscan   Nuclear stress test 05/04/2022: Myocardial perfusion is abnormal. Breast tissue attenuation noted in inferior wall. Cannot exclude mild degree medium extent perfusion defect consistent with ischemia located in the apical inferior wall and mid inferior wall. The LV appears slightly  dilated in the stress images, although TID index is insignificant. Overall LV systolic function is normal without regional wall motion abnormalities.  Stress LV EF: 54%.  Non-diagnostic ECG stress. The heart rate response was consistent with Regadenoson .  Compared to previous study 03/24/2019, soft tissue attenuation was previously noted. Possible inferior ischemia is new.  Intermediate risk.    Labs March-April 2024: Glucose 114, BUN/Cr 42/1.51. EGFR 37. Na/K 140/4.6 NT proBNP 577 H/H 9.2/25.7. MCV 89. Platelets 246 No recent lipid panel    Risk Assessment/Calculations:   CHA2DS2-VASc Score is  5, annual stroke risk 7.2% -(CHF; HTN; vasc disease DM,  Female = 1; Age <65 =0; 65-74 = 1,  >75 =2; stroke = 2).  -(Yearly risk of stroke: Score of 1=1.3; 2=2.2; 3=3.2; 4=4; 5=6.7; 6=9.8; 7=>9.8)   Physical Exam:   Physical Exam Vitals and nursing note reviewed.  Constitutional:      General: She is not in acute distress. Neck:     Vascular: JVD present.  Cardiovascular:     Rate and Rhythm: Normal rate and regular rhythm.     Heart sounds: Murmur  heard.     Harsh midsystolic murmur is present with a grade of 3/6 at the upper right sternal border radiating to the neck.  Pulmonary:     Effort: Pulmonary effort is normal.     Breath sounds: Examination of the right-lower field reveals decreased breath sounds. Examination of the left-lower field reveals decreased breath sounds. Decreased breath sounds present. No wheezing or rales.  Musculoskeletal:     Right lower leg: Edema (2+) present.     Left lower leg: Edema (2+) present.      VISIT DIAGNOSES: No diagnosis found.     ASSESSMENT AND PLAN: .    REESE CLARKIN is a 73 y.o. female with hypertension, diabetes mellitus, HFpEF, paroxysmal Afib/flutter, PSVT, s/p TAVR Danielle Beck 26 mm) 08/2023  HFpEF: Acute on chronic HFpEF. In spite of successful TAVR with no paravalvular leak and no significant residual gradient,  she has had worsening leg swelling, and shortness of breath symptoms.  This temporarily improved with IV Lasix  during ER visit, but is not significantly responded to torsemide  40 mg twice daily in last 2-3 days.  EKG today shows unchanged (branch block without any heart block.  In the interim, her sodium has reduced, suggestive of dilutional hyponatremia, and creatinine has gone back up from 1.2-1.8.Aaron Aas  Physical exam is consistent with volume overload.  She has labored breathing with even completing sentences today, although oxygen saturation is at 93% on room air.  I do not think she will be adequately treated with oral diuretics at this point.  Therefore, recommend hospital admission for IV diuresis.  Start IV Lasix  40 mg twice daily.  Recommend strict I's/O and monitoring of renal function.  She is currently off Farxiga  since the time of her TAVR, was previously on it.  Hopefully can resume Farxiga  after IV diuresis.  She will be taken care of by heart failure team over the weekend, appreciate their help.  She was scheduled to have post TAVR echocardiogram on 10/11/2023.  Unless there is lack of response to IV diuresis as expected, I think we can keep the postop echocardiogram as outpatient on 10/11/2023.  I will plan on seeing her in the office as previously scheduled on 10/15/2023.   Severe aortic stenosis: Now resolved  s/p TAVR.  No paravalvular leak   CAD: Moderate, nonobstructive except severe ostial diagonal stenosis, recommend medical management for this. Continue statin.  Not on aspirin  due to ongoing use of anticoagulation  Paroxysmal Afib/flutter + PSVT: Currently in sinus rhythm.   CHA2DS2VASc score 4, annual stroke risk 5%. Continue Xarelto  15 mg daily, given GFR <50. Stable normocytic anemia. Continue Xarelto  15 mg daily.   Hypertension: Controlled  Mixed hyperlipidemia: LDL 90 on statin. Will consider injectable therapy during outpatient follow up.   Signed, Cody Das, MD

## 2023-09-28 NOTE — Telephone Encounter (Signed)
 Happy to see her today.  Thanks MJP

## 2023-09-28 NOTE — Progress Notes (Signed)
   09/28/23 2040  BiPAP/CPAP/SIPAP  BiPAP/CPAP/SIPAP Pt Type Adult  Reason BIPAP/CPAP not in use Non-compliant  BiPAP/CPAP /SiPAP Vitals  Pulse Rate 82  Resp (!) 28  SpO2 98 %  Bilateral Breath Sounds Clear  MEWS Score/Color  MEWS Score 2  MEWS Score Color Yellow

## 2023-09-28 NOTE — Progress Notes (Addendum)
 ADVANCED HEART FAILURE CONSULT NOTE  Referring Physician: Cody Das, MD  Primary Care: Elida Grounds, DO  CC: Acute on chronic HFpEF  HPI: Danielle Beck is a 73 y.o. female  with CAD, T2DM, HTN, HLD, PAF on Xarelto , CKD IIIB, HFpEF, spinal stenosis and pLFLG AS s/p TAVR (09/18/23) that is currently admitted with acute on chronic HFpEF exacerbation.   Danielle Beck underwent successful TAVR with no complications on 09/18/23; pre-procedure RHC on 08/03/23 with RA 9 PCW 24 and CI of 2.7L/min/m2. According to Danielle Beck and her husband, over the past 5-6 days she has become increasingly short of breath. On 09/21/23 patient went to the ER where her symptoms resolved with IV lasix  80mg  x 1. Since that time she has been taking torsmide 40mg  BID with progression of dyspnea and significant orthopnea/PND. Today she reports having difficulty laying flat or walking more than a few feet. Patient was directly admitted from clinic for acute on chronic HFpEF exacerbation  FH/SH: no pertinent hx.    [START ON 09/29/2023] amLODipine   10 mg Oral Daily   [START ON 09/29/2023] atorvastatin   20 mg Oral Daily   ferrous sulfate   325 mg Oral BID   furosemide   80 mg Intravenous BID   [START ON 09/29/2023] insulin  aspart  0-20 Units Subcutaneous TID WC   insulin  aspart  3 Units Subcutaneous TID WC   [START ON 09/29/2023] insulin  glargine-yfgn  30 Units Subcutaneous Daily   [START ON 09/29/2023] losartan   50 mg Oral Daily   metolazone   2.5 mg Oral Once   pregabalin   75 mg Oral BID   pyridOXINE   100 mg Oral Daily   Rivaroxaban   15 mg Oral Q supper   sodium chloride  flush  3 mL Intravenous Q12H   vitamin B-1  250 mg Oral Daily      PHYSICAL EXAM: Vitals:   09/28/23 1550  BP: 110/62  Pulse: 83  Resp: 20  Temp: 98.4 F (36.9 C)  SpO2: 98%   In SR with rate in the 80s.  General:   Dyspneic at rest. Appears weak.  Neck: JVP to jaw  Cor: PMI nondisplaced. Regular rate & rhythm. No rubs, gallops or  murmurs. Lungs: Decreased in the bases. Abdomen: soft, nontender, nondistended.  Extremities: no cyanosis, clubbing, rash, warm R and LLE 1+ edema Neuro: alert & oriented x3     DATA REVIEW  ECG: 09/28/23: NSR w/ LBBB & 1AVB  As per my personal interpretation  ECHO: 09/19/23: LVEF 60%; no WMA; normal RV function. AVR with no pathologic regurgitation as per my personal interpretation  ASSESSMENT & PLAN:  Acute on chronic HFpEF exacerbation - Short of breath on my exam with difficulty laying flat; +conversational dyspnea. +1 edema to knees with increased abdominal protuberance & JVP to the mandible. BNP 300.  - CXR with bilateral pulmonary edema; exp wheezing on exam.  - TTE from 09/19/23 shows a well positioned valve with no paravalvular regurgitation.  - Developed LBBB after procedure; unsure if this is playing a part in her symptoms. Her TTE does not demonstrate changes in wall motion 2/2 LBBB. Will repeat TTE.  - IV lasix  80mg  BID + metolazone  2.5mg .  - Duoneb x 1 - Start BIPAP PRN.  - Lactic acid elevated at 4. If it does not improve on repeat in 2hrs plan for RHC with possible inotropes.  - BMP pending.   2. Severe AS s/p TAVR - TAVR on 09/18/23 with no complications; well positioned valve on  TTE from 09/19/23.  - Danielle Beck 26mm - no murmur on exam.   3. HTN - Will plan to start afterload reduction later today; awaiting lab work and initial stabilization with lasix , bipap, etc.    Nieves Bars, NP-C  [facility] Advanced Heart Failure Mechanical Circulatory Support

## 2023-09-28 NOTE — Patient Instructions (Signed)
 DIRECT ADMIT   Follow-Up: At [facility], you and your health needs are our priority.  As part of our continuing mission to provide you with exceptional heart care, our providers are all part of one team.  This team includes your primary Cardiologist (physician) and Advanced Practice Providers or APPs (Physician Assistants and Nurse Practitioners) who all work together to provide you with the care you need, when you need it.  Your next appointment:   TO BE DETERMINED   Provider:   Cody Das, MD    We recommend signing up for the patient portal called "MyChart".  Sign up information is provided on this After Visit Summary.  MyChart is used to connect with patients for Virtual Visits (Telemedicine).  Patients are able to view lab/test results, encounter notes, upcoming appointments, etc.  Non-urgent messages can be sent to your provider as well.   To learn more about what you can do with MyChart, go to [email or web address].

## 2023-09-28 NOTE — H&P (Signed)
 Cardiology Admission History and Physical   Patient ID: Danielle Beck MRN: [id number]; DOB: 1951-02-08   Admission date: 09/28/2023  PCP:  Elida Grounds, DO   [facility] Providers Cardiologist:  Cody Das, MD  Structural Heart:  Antoinette Batman, MD      Chief Complaint:  Shortness of breath  Patient Profile:   Danielle Beck is a 73 y.o. female with HFpEF who is being seen 09/28/2023 for the evaluation of shortness of breath.    History of Present Illness:   Ms. Lauletta hypertension, diabetes mellitus, HFpEF, paroxysmal Afib/flutter, PSVT, s/p TAVR Brookie Cantor III 26 mm) 08/2023   Patient seen by me in the office earlier in the day.  Office visit (no charge) copied below for documentation.  Patient has had at least 1 ER visit since her TAVR with worsening shortness of breath and leg edema that temporarily improved with IV Lasix  during ER visit.  She was seen 4 days ago for TAVR follow-up, but has had further worsening of shortness of breath and leg swelling since then.  She has episodes of waking up middle of night around 3 AM short of breath associated with chest heaviness, having to sit up. Today, she is labored with completing her sentences.  She has been taking torsemide  40 mg twice daily without improvement in symptoms over the last 2 days.  Past Medical History:  Diagnosis Date   CHF (congestive heart failure) (HCC)    Diabetes mellitus without complication (HCC)    Hyperlipidemia    Hypertension    Severe aortic stenosis     Past Surgical History:  Procedure Laterality Date   CATARACT EXTRACTION     INTRAOPERATIVE TRANSTHORACIC ECHOCARDIOGRAM N/A 09/18/2023   Procedure: ECHOCARDIOGRAM, TRANSTHORACIC;  Surgeon: Odie Benne, MD;  Location: [facility] INVASIVE CV LAB;  Service: Cardiovascular;  Laterality: N/A;   RIGHT/LEFT HEART CATH AND CORONARY ANGIOGRAPHY N/A 08/03/2023   Procedure: RIGHT/LEFT HEART CATH AND CORONARY  ANGIOGRAPHY;  Surgeon: Cody Das, MD;  Location: [facility] INVASIVE CV LAB;  Service: Cardiovascular;  Laterality: N/A;   TUBAL LIGATION  1985     Medications Prior to Admission: Prior to Admission medications   Medication Sig Start Date End Date Taking? Authorizing Provider  amLODipine  (NORVASC ) 10 MG tablet Take 1 tablet (10 mg total) by mouth daily. 07/12/23  Yes   Artificial Tears ophthalmic solution Place 1 drop into both eyes in the morning and at bedtime.   Yes [provider]  atorvastatin  (LIPITOR) 10 MG tablet Take 2 tablets (20 mg total) by mouth daily for cholesterol. 07/12/23  Yes   Calcium  Carb-Cholecalciferol (CALCIUM  600 + D PO) Take 1 tablet by mouth 2 (two) times daily.   Yes [provider]  Cholecalciferol (VITAMIN D3) 1000 units CAPS Take 1,000 Units by mouth every evening.   Yes [provider]  cyanocobalamin (VITAMIN B12) 1000 MCG tablet Take 1,000 mcg by mouth every evening.   Yes [provider]  dorzolamide  (TRUSOPT ) 2 % ophthalmic solution Place 1 drop into the left eye 2 (two) times daily. 12/25/18  Yes [provider]  Ferrous Sulfate  (IRON) 325 (65 Fe) MG TABS Take 325 mg by mouth 2 (two) times daily.   Yes [provider]  insulin  glargine (LANTUS  SOLOSTAR) 100 UNIT/ML Solostar Pen Inject 34 Units into the skin daily. Patient taking differently: Inject 30 Units into the skin daily. 07/12/23  Yes   latanoprost  (XALATAN ) 0.005 % ophthalmic solution Place 1  drop into both eyes at bedtime.  12/25/18  Yes [provider]  losartan  (COZAAR ) 50 MG tablet Take 1 tablet (50 mg total) by mouth daily for blood pressure. 07/12/23  Yes   metFORMIN  (GLUCOPHAGE ) 1000 MG tablet Take 1 tablet (1,000 mg total) by mouth 2 (two) times daily with a meal. 07/12/23  Yes   methocarbamol  (ROBAXIN ) 500 MG tablet Take 2 tablets (1,000 mg total) by mouth daily as needed for muscle spasms. Patient taking differently: Take 1,000 mg by  mouth every evening. 07/12/23  Yes   metoprolol  tartrate (LOPRESSOR ) 25 MG tablet Take 1 tablet (25 mg total) by mouth 2 (two) times daily. 07/19/23  Yes Rickie Gange J, MD  pregabalin  (LYRICA ) 75 MG capsule Take 1 capsule (75 mg total) by mouth 2 (two) times daily. Patient taking differently: Take 150 mg by mouth every evening. 07/12/23  Yes   pyridOXINE  (VITAMIN B-6) 100 MG tablet Take 100 mg by mouth every evening.   Yes [provider]  Thiamine  HCl (VITAMIN B-1) 250 MG tablet Take 250 mg by mouth every evening.   Yes [provider]  timolol  (TIMOPTIC ) 0.5 % ophthalmic solution Place 1 drop into the left eye 2 (two) times daily. 12/26/18  Yes [provider]  torsemide  (DEMADEX ) 20 MG tablet Take 40 mg by mouth 2 (two) times daily.   Yes [provider]  traMADol  (ULTRAM ) 50 MG tablet Take 1 tablet (50 mg total) by mouth every 6 (six) hours as needed for moderate pain 07/12/23  Yes   XARELTO  15 MG TABS tablet TAKE 1 TABLET EVERY DAY WITH SUPPER 10/27/22  Yes Marc Sivertsen J, MD  Alcohol  Swabs  (ALCOHOL  PREP) 70 % PADS Use as directed aily 07/16/23     amoxicillin  (AMOXIL ) 500 MG capsule Take 4 capsules (2,000 mg total) by mouth as directed. 1 hour prior to dental work including cleanings 09/24/23   Ardia Kraft, PA-C  Blood Glucose Monitoring Suppl (ONETOUCH VERIO FLEX SYSTEM) w/Device KIT Use as directed. 09/05/23   Elida Grounds, DO  glucose blood (ONETOUCH VERIO) test strip For use when checking blood sugars twice daily. 09/05/23   Elida Grounds, DO  Insulin  Pen Needle (DROPLET PEN NEEDLES) 32G X 6 MM MISC Use to inject insulin  once daily. 07/16/23     Lancets (ONETOUCH DELICA PLUS LANCET33G) MISC Use to check blood sugar daily. 07/16/23     nitroGLYCERIN  (NITROSTAT ) 0.4 MG SL tablet Place 0.4 mg under the tongue every 5 (five) minutes as needed for chest pain.    [provider]     Allergies:   No Known Allergies  Social History:    Social History   Socioeconomic History   Marital status: Married    Spouse name: Not on file   Number of children: 3   Years of education: Not on file   Highest education level: Not on file  Occupational History   Not on file  Tobacco Use   Smoking status: Never   Smokeless tobacco: Never  Vaping Use   Vaping status: Never Used  Substance and Sexual Activity   Alcohol  use: Not Currently   Drug use: Not Currently   Sexual activity: Not on file  Other Topics Concern   Not on file  Social History Narrative   Not on file   Social Drivers of Health   Financial Resource Strain: Not on file  Food Insecurity: No Food Insecurity (09/19/2023)   Hunger Vital Sign    Worried  About Running Out of Food in the Last Year: Never true    Ran Out of Food in the Last Year: Never true  Transportation Needs: No Transportation Needs (09/19/2023)   PRAPARE - Administrator, Civil Service (Medical): No    Lack of Transportation (Non-Medical): No  Physical Activity: Not on file  Stress: Not on file  Social Connections: Unknown (09/19/2023)   Social Connection and Isolation Panel [NHANES]    Frequency of Communication with Friends and Family: More than three times a week    Frequency of Social Gatherings with Friends and Family: More than three times a week    Attends Religious Services: Patient declined    Database administrator or Organizations: No    Attends Banker Meetings: Never    Marital Status: Married  Catering manager Violence: Not At Risk (09/19/2023)   Humiliation, Afraid, Rape, and Kick questionnaire    Fear of Current or Ex-Partner: No    Emotionally Abused: No    Physically Abused: No    Sexually Abused: No    Family History:   The patient's family history includes Diabetes in her brother, brother, brother, and mother; Heart failure in her father; Hypertension in her brother. There is no history of Breast cancer.    ROS:  Please see the history of  present illness.  All other ROS reviewed and negative.     Physical Exam/Data:   Vitals:   09/28/23 1351 09/28/23 1550 09/28/23 1728  BP:  110/62 (!) 147/64  Pulse:  83 89  Resp:  20 (!) 22  Temp:  98.4 F (36.9 C) 98.4 F (36.9 C)  TempSrc:  Oral Oral  SpO2:  98% 92%  Weight: 85.2 kg 85.2 kg   Height:  5\' 2"  (1.575 m)    No intake or output data in the 24 hours ending 09/28/23 1731    09/28/2023    3:50 PM 09/28/2023    1:51 PM 09/28/2023    1:15 PM  Last 3 Weights  Weight (lbs) 187 lb 13.3 oz 187 lb 12.8 oz 192 lb 6.4 oz  Weight (kg) 85.2 kg 85.186 kg 87.272 kg     Body mass index is 34.35 kg/m.   Physical Exam Vitals and nursing note reviewed.  Constitutional:      General: She is not in acute distress. Neck:     Vascular: JVD present.  Cardiovascular:     Rate and Rhythm: Normal rate and regular rhythm.     Heart sounds: Normal heart sounds. No murmur heard. Pulmonary:     Effort: Pulmonary effort is normal.     Breath sounds: Normal breath sounds. No wheezing or rales.  Musculoskeletal:     Right lower leg: Edema (2+) present.     Left lower leg: Edema (2+) present.      EKG 09/28/2023:   Sinus rhythm with 1st degree A-V block Left bundle branch block When compared with ECG of 24-Sep-2023 11:12, No significant change was found  Relevant CV Studies: TAVR note 09/18/2023: Edwards Sapien 3 THV (size 26 mm)   Coronary angiography 08/03/2023: LM: Normal LAD: Mild calcification          Prox-mid 30% disease Lcx: Large vessel        50% mid stenosis between ectatic segment on either side RCA: Mild calcification with mild diffuse disease  Right heart catheterization 08/03/2023: RA: 9 mmHg RV: 44/4 mmHg PA: 42/20 mmHg, mPAP 36 mmHg PCW: 24 mmHg  AO sats: 92% PA sats: 57%   CO: 5.0 L/min CI: 2.7 L/min/m2   Two vessel nonobstructive CAD No proximal CAD Moderate PH, WHO Grp II Mildly elevated filling pressures  Echocardiogram 09/19/2023:  1. Left  ventricular ejection fraction, by estimation, is 60 to 65%. The  left ventricle has normal function. The left ventricle has no regional  wall motion abnormalities. The left ventricular internal cavity size was  mildly dilated. Left ventricular  diastolic parameters are indeterminate. Elevated left ventricular  end-diastolic pressure.   2. Right ventricular systolic function is normal. The right ventricular  size is normal. Tricuspid regurgitation signal is inadequate for assessing  PA pressure.   3. The mitral valve is degenerative. Mild mitral valve regurgitation. No  evidence of mitral stenosis. The mean mitral valve gradient is 4.0 mmHg.   4. The aortic valve has been repaired/replaced. Aortic valve  regurgitation is not visualized. No aortic stenosis is present. There is a  26 mm Sapien prosthetic (TAVR) valve present in the aortic position.  Aortic valve area, by VTI measures 2.54 cm.  Aortic valve mean gradient measures 5.8 mmHg. Aortic valve Vmax measures  1.70 m/s.   5. The inferior vena cava is normal in size with greater than 50%  respiratory variability, suggesting right atrial pressure of 3 mmHg.   6. Compared to study dated 09/18/2023, the mean TAVR gradient has  increased slightly from 3 to 5.54mmHg, DI has increased from 0.47 to 0.81.  Normal functioning TAVR with no perivalular leak.   Laboratory Data:  High Sensitivity Troponin:   Recent Labs  Lab 09/21/23 0154 09/21/23 0340  TROPONINIHS 130* 136*      ChemistryNo results for input(s): "NA", "K", "CL", "CO2", "GLUCOSE", "BUN", "CREATININE", "CALCIUM ", "MG", "GFRNONAA", "GFRAA", "ANIONGAP" in the last 168 hours.  No results for input(s): "PROT", "ALBUMIN", "AST", "ALT", "ALKPHOS", "BILITOT" in the last 168 hours. Lipids No results for input(s): "CHOL", "TRIG", "HDL", "LABVLDL", "LDLCALC", "CHOLHDL" in the last 168 hours. HematologyNo results for input(s): "WBC", "RBC", "HGB", "HCT", "MCV", "MCH", "MCHC", "RDW", "PLT"  in the last 168 hours. Thyroid No results for input(s): "TSH", "FREET4" in the last 168 hours. BNPNo results for input(s): "BNP", "PROBNP" in the last 168 hours.  DDimer No results for input(s): "DDIMER" in the last 168 hours.   Radiology/Studies:  DG CHEST PORT 1 VIEW Result Date: 09/28/2023 CLINICAL DATA:  Dyspnea. EXAM: PORTABLE CHEST 1 VIEW COMPARISON:  09/21/2023. FINDINGS: Mild-to-moderate diffuse pulmonary vascular congestion with bilateral hilar and bibasilar predominance. There is blunting of bilateral lateral costophrenic angles, suggesting bilateral trace pleural effusions. Stable cardio-mediastinal silhouette. Prosthetic aortic valve noted. No acute osseous abnormalities. The soft tissues are within normal limits. IMPRESSION: *Findings favor congestive heart failure/pulmonary edema. Electronically Signed   By: Beula Brunswick M.D.   On: 09/28/2023 16:42     Assessment and Plan:   73 y.o. female with hypertension, diabetes mellitus, HFpEF, paroxysmal Afib/flutter, PSVT, s/p TAVR Brookie Cantor III 26 mm) 08/2023   HFpEF: Acute on chronic HFpEF. In spite of successful TAVR with no paravalvular leak and no significant residual gradient, she has had worsening leg swelling, and shortness of breath symptoms.  This temporarily improved with IV Lasix  during ER visit, but is not significantly responded to torsemide  40 mg twice daily in last 2-3 days.  EKG today shows unchanged (branch block without any heart block.  In the interim, her sodium has reduced, suggestive of dilutional hyponatremia, and creatinine has gone back up from 1.2-1.8.Aaron Aas  Physical  exam is consistent with volume overload.  She has labored breathing with even completing sentences today, although oxygen saturation is at 93% on room air.  I do not think she will be adequately treated with oral diuretics at this point.  Therefore, recommend hospital admission for IV diuresis.  Start IV Lasix  40 mg twice daily.  Recommend strict I's/O  and monitoring of renal function.  She is currently off Farxiga  since the time of her TAVR, was previously on it.  Hopefully can resume Farxiga  after IV diuresis.  She will be taken care of by heart failure team over the weekend, appreciate their help.  She was scheduled to have post TAVR echocardiogram on 10/11/2023.  Unless there is lack of response to IV diuresis as expected, I think we can keep the postop echocardiogram as outpatient on 10/11/2023.  I will plan on seeing her in the office as previously scheduled on 10/15/2023.   Severe aortic stenosis: Now resolved  s/p TAVR.  No paravalvular leak    CAD: Moderate, nonobstructive except severe ostial diagonal stenosis, recommend medical management for this. Continue statin.  Not on aspirin  due to ongoing use of anticoagulation   Paroxysmal Afib/flutter + PSVT: Currently in sinus rhythm.   CHA2DS2VASc score 4, annual stroke risk 5%. Continue Xarelto  15 mg daily, given GFR <50. Stable normocytic anemia. Continue Xarelto  15 mg daily.   Hypertension: Controlled   Mixed hyperlipidemia: LDL 90 on statin. Will consider injectable therapy during outpatient follow up.     Risk Assessment/Risk Scores:   [place]  Heart Association (NYHA) Functional Class NYHA Class IV  CHA2DS2-VASc Score = 5  This indicates a 7.2% annual risk of stroke. The patient's score is based upon: CHF History: 1 HTN History: 1 Diabetes History: 1 Stroke History: 0 Vascular Disease History: 0 Age Score: 1 Gender Score: 1     Code Status: Full Code  Severity of Illness: The appropriate patient status for this patient is INPATIENT. Inpatient status is judged to be reasonable and necessary in order to provide the required intensity of service to ensure the patient's safety. The patient's presenting symptoms, physical exam findings, and initial radiographic and laboratory data in the context of their chronic comorbidities is felt to place them at high risk for  further clinical deterioration. Furthermore, it is not anticipated that the patient will be medically stable for discharge from the hospital within 2 midnights of admission.   * I certify that at the point of admission it is my clinical judgment that the patient will require inpatient hospital care spanning beyond 2 midnights from the point of admission due to high intensity of service, high risk for further deterioration and high frequency of surveillance required.*   For questions or updates, please contact  [facility] Please consult [email or web address] for contact info under     Signed, Cody Das, MD 09/28/2023 5:31 PM

## 2023-09-28 NOTE — Telephone Encounter (Signed)
 Pt seen in office and was direct admitted.

## 2023-09-29 ENCOUNTER — Inpatient Hospital Stay (HOSPITAL_COMMUNITY)

## 2023-09-29 ENCOUNTER — Other Ambulatory Visit: Payer: Self-pay

## 2023-09-29 DIAGNOSIS — R0602 Shortness of breath: Secondary | ICD-10-CM

## 2023-09-29 LAB — BASIC METABOLIC PANEL WITH GFR
Anion gap: 13 (ref 5–15)
BUN: 44 mg/dL — ABNORMAL HIGH (ref 8–23)
CO2: 21 mmol/L — ABNORMAL LOW (ref 22–32)
Calcium: 8.6 mg/dL — ABNORMAL LOW (ref 8.9–10.3)
Chloride: 91 mmol/L — ABNORMAL LOW (ref 98–111)
Creatinine, Ser: 1.38 mg/dL — ABNORMAL HIGH (ref 0.44–1.00)
GFR, Estimated: 41 mL/min — ABNORMAL LOW (ref >60)
Glucose, Bld: 105 mg/dL — ABNORMAL HIGH (ref 70–99)
Potassium: 3.1 mmol/L — ABNORMAL LOW (ref 3.5–5.1)
Sodium: 125 mmol/L — ABNORMAL LOW (ref 135–145)

## 2023-09-29 LAB — ECHOCARDIOGRAM LIMITED
AR max vel: 2.68 cm2
AV Area VTI: 2.61 cm2
AV Area mean vel: 2.72 cm2
AV Mean grad: 6 mmHg
AV Peak grad: 10.8 mmHg
Ao pk vel: 1.64 m/s
Height: 62 in
MV VTI: 2.3 cm2
Weight: 2958.4 [oz_av]

## 2023-09-29 LAB — GLUCOSE, CAPILLARY
Glucose-Capillary: 101 mg/dL — ABNORMAL HIGH (ref 70–99)
Glucose-Capillary: 256 mg/dL — ABNORMAL HIGH (ref 70–99)
Glucose-Capillary: 227 mg/dL — ABNORMAL HIGH (ref 70–99)
Glucose-Capillary: 284 mg/dL — ABNORMAL HIGH (ref 70–99)

## 2023-09-29 MED ORDER — POTASSIUM CHLORIDE CRYS ER 20 MEQ PO TBCR: 40.0000 meq | EXTENDED_RELEASE_TABLET | ORAL | Status: DC

## 2023-09-29 MED ORDER — POTASSIUM CHLORIDE CRYS ER 20 MEQ PO TBCR
40.0000 meq | EXTENDED_RELEASE_TABLET | ORAL | Status: AC
Start: 2023-09-29 — End: 2023-09-29
  Administered 2023-09-29 (×2): 40 meq via ORAL
  Filled 2023-09-29 (×2): qty 2

## 2023-09-29 MED ORDER — POTASSIUM CHLORIDE CRYS ER 20 MEQ PO TBCR
40.0000 meq | EXTENDED_RELEASE_TABLET | Freq: Once | ORAL | Status: AC
  Administered 2023-09-30: 40 meq via ORAL
  Filled 2023-09-29: qty 2

## 2023-09-29 MED ORDER — ACETAZOLAMIDE 250 MG PO TABS
250.0000 mg | ORAL_TABLET | Freq: Once | ORAL | Status: AC
  Administered 2023-09-29: 250 mg via ORAL
  Filled 2023-09-29: qty 1

## 2023-09-29 MED ORDER — METOLAZONE 5 MG PO TABS: 2.5000 mg | ORAL_TABLET | Freq: Once | ORAL | Status: DC

## 2023-09-29 NOTE — Progress Notes (Signed)
   ADVANCED HEART FAILURE CONSULT NOTE  Referring Physician: Cody Das, MD  Primary Care: Elida Grounds, DO  CC: Acute on chronic HFpEF  Interval hx: - 3.6L urine output yesterday, 1.4 so far today - Reports improvement in dyspnea; comfortable today.    acetaZOLAMIDE  250 mg Oral Once   amLODipine   10 mg Oral Daily   atorvastatin   20 mg Oral Daily   ferrous sulfate   325 mg Oral BID   furosemide   80 mg Intravenous BID   insulin  aspart  0-20 Units Subcutaneous TID WC   insulin  aspart  3 Units Subcutaneous TID WC   insulin  glargine-yfgn  30 Units Subcutaneous Daily   losartan   50 mg Oral Daily   potassium chloride   40 mEq Oral Q4H   [START ON 09/30/2023] potassium chloride   40 mEq Oral Once   pregabalin   75 mg Oral BID   pyridOXINE   100 mg Oral Daily   Rivaroxaban   15 mg Oral Q supper   sodium chloride  flush  3 mL Intravenous Q12H   thiamine   250 mg Oral Daily      PHYSICAL EXAM: Vitals:   09/29/23 1142 09/29/23 1534  BP: (!) 119/51 (!) 124/56  Pulse: 82 81  Resp: 19 12  Temp: 98.8 F (37.1 C) 98.8 F (37.1 C)  SpO2: 99% 96%   In SR with rate in the 80s.  General:  comfortable on exam Neck: JVP 8cm Cor: PMI nondisplaced. Regular rate & rhythm. No rubs, gallops or murmurs. Lungs: normal work of breathing Abdomen: soft Extremities: no cyanosis, clubbing, rash, warm R and LLE 1+ edema Neuro: alert & oriented x3     DATA REVIEW  ECG: 09/28/23: NSR w/ LBBB & 1AVB  As per my personal interpretation  ECHO: 09/19/23: LVEF 60%; no WMA; normal RV function. AVR with no pathologic regurgitation as per my personal interpretation  ASSESSMENT & PLAN:  Acute on chronic HFpEF exacerbation - Short of breath on my exam with difficulty laying flat; +conversational dyspnea. +1 edema to knees with increased abdominal protuberance & JVP to the mandible. BNP 300.  - CXR with bilateral pulmonary edema; exp wheezing on exam.  - TTE from 09/19/23 shows a well positioned valve  with no paravalvular regurgitation.  - Developed LBBB after procedure; unsure if this is playing a part in her symptoms. Her TTE does not demonstrate changes in wall motion 2/2 LBBB. Will repeat TTE.  - Significant improvement in dyspnea - Continue IV lasix  80mg  BID - diamox 500mg  x1.   2. Severe AS s/p TAVR - TAVR on 09/18/23 with no complications; well positioned valve on TTE from 09/19/23.  - Danielle Beck 26mm - no murmur on exam.   3. HTN - Continue losartan . BP at goal.    Danielle Beck Advanced Heart Failure Mechanical Circulatory Support

## 2023-09-29 NOTE — Plan of Care (Signed)

## 2023-09-30 LAB — GLUCOSE, CAPILLARY
Glucose-Capillary: 137 mg/dL — ABNORMAL HIGH (ref 70–99)
Glucose-Capillary: 299 mg/dL — ABNORMAL HIGH (ref 70–99)
Glucose-Capillary: 198 mg/dL — ABNORMAL HIGH (ref 70–99)
Glucose-Capillary: 182 mg/dL — ABNORMAL HIGH (ref 70–99)

## 2023-09-30 LAB — BASIC METABOLIC PANEL WITH GFR
Anion gap: 11 (ref 5–15)
BUN: 43 mg/dL — ABNORMAL HIGH (ref 8–23)
CO2: 21 mmol/L — ABNORMAL LOW (ref 22–32)
Calcium: 8.4 mg/dL — ABNORMAL LOW (ref 8.9–10.3)
Chloride: 90 mmol/L — ABNORMAL LOW (ref 98–111)
Creatinine, Ser: 1.44 mg/dL — ABNORMAL HIGH (ref 0.44–1.00)
GFR, Estimated: 39 mL/min — ABNORMAL LOW (ref >60)
Glucose, Bld: 152 mg/dL — ABNORMAL HIGH (ref 70–99)
Potassium: 3.6 mmol/L (ref 3.5–5.1)
Sodium: 122 mmol/L — ABNORMAL LOW (ref 135–145)

## 2023-09-30 MED ORDER — TORSEMIDE 20 MG PO TABS
60.0000 mg | ORAL_TABLET | Freq: Every day | ORAL | Status: DC
  Administered 2023-10-01: 60 mg via ORAL
  Filled 2023-09-30: qty 3

## 2023-09-30 NOTE — Plan of Care (Signed)

## 2023-09-30 NOTE — Evaluation (Signed)
 Physical Therapy Evaluation Patient Details Name: Danielle Beck MRN: [id number] DOB: 1951/04/24 Today's Date: 09/30/2023  History of Present Illness  73 y.o. female presents to [facility] 09/28/23 for SOB and LE edema. CXR B pulmonary edema. Admitted with acute on chronic HFpEF. PMHx: HFpEF, DM, HTN, PAF, PSVT s/p TAVR 09/18/23  Clinical Impression  Pt in bed upon arrival and agreeable to PT eval. PTA, pt was ModI with SP cane or no AD for mobility. Pt reported decreased activity tolerance after recent TAVR surgery (09/18/23) and increased difficulty with stair negotiation. In today's session, pt required CGA to stand and ambulate 234ft with RW. Pt was able to take steps with no AD, however, preferred RW for improved stability and comfort. Pt scored a 25.11s on 5xSTS indicating pt is at a higher fall risk. Recommending post-acute OP PT to work on balance and independence with mobility. Pt currently with functional limitations due to the deficits listed below (see PT Problem List). Pt would benefit from acute skilled PT to address functional impairments. Acute PT to follow.    5xSTS- 25.11s >15s indicative of high fall risk      If plan is discharge home, recommend the following: A little help with walking and/or transfers;A little help with bathing/dressing/bathroom;Assistance with cooking/housework;Assist for transportation;Help with stairs or ramp for entrance   Can travel by private vehicle   Yes     Equipment Recommendations Rolling walker (2 wheels)  Recommendations for Other Services  OT consult    Functional Status Assessment Patient has had a recent decline in their functional status and demonstrates the ability to make significant improvements in function in a reasonable and predictable amount of time.     Precautions / Restrictions Precautions Precautions: Fall Restrictions Weight Bearing Restrictions Per Provider Order: No      Mobility  Bed Mobility Overal bed mobility:  Modified Independent   Transfers Overall transfer level: Needs assistance Equipment used: Rolling walker (2 wheels) Transfers: Sit to/from Stand Sit to Stand: Contact guard assist       General transfer comment: CGA for safety and stability    Ambulation/Gait Ambulation/Gait assistance: Supervision Gait Distance (Feet): 200 Feet Assistive device: Rolling walker (2 wheels), None Gait Pattern/deviations: Step-through pattern, Decreased stride length Gait velocity: decr     General Gait Details: slow and steady gait. Able to take steps with no AD, however, preferred RW for comfort     Balance Overall balance assessment: Needs assistance, Mild deficits observed, not formally tested Sitting-balance support: No upper extremity supported, Feet supported Sitting balance-Leahy Scale: Good     Standing balance support: No upper extremity supported Standing balance-Leahy Scale: Fair Standing balance comment: able to stand with no AD        Pertinent Vitals/Pain Pain Assessment Pain Assessment: No/denies pain    Home Living Family/patient expects to be discharged to:: Private residence Living Arrangements: Spouse/significant other Available Help at Discharge: Family;Available 24 hours/day Type of Home: House Home Access: Level entry     Alternate Level Stairs-Number of Steps: has stair lift Home Layout: Two level Home Equipment: Shower seat;Cane - single point;Cane - quad      Prior Function Prior Level of Function : Needs assist   Mobility Comments: ModI with no AD or SP cane ADLs Comments: husband assists with getting in and out of tub, with driving, and getting groceries. Has staff come to clean     Extremity/Trunk Assessment   Upper Extremity Assessment Upper Extremity Assessment: Defer to OT evaluation  Lower Extremity Assessment Lower Extremity Assessment: Generalized weakness    Cervical / Trunk Assessment Cervical / Trunk Assessment: Normal   Communication   Communication Communication: No apparent difficulties    Cognition Arousal: Alert Behavior During Therapy: WFL for tasks assessed/performed   PT - Cognitive impairments: No apparent impairments    Following commands: Intact       Cueing Cueing Techniques: Verbal cues      PT Assessment Patient needs continued PT services  PT Problem List Decreased strength;Decreased activity tolerance;Decreased balance;Decreased mobility       PT Treatment Interventions DME instruction;Gait training;Stair training;Functional mobility training;Therapeutic activities;Therapeutic exercise;Balance training;Neuromuscular re-education;Patient/family education    PT Goals (Current goals can be found in the Care Plan section)  Acute Rehab PT Goals Patient Stated Goal: to get stronger again PT Goal Formulation: With patient/family Time For Goal Achievement: 10/14/23 Potential to Achieve Goals: Good Additional Goals Additional Goal #1: Pt will improve 5xSTS score from 25.11s to 20 s to demonstrate improvement in reducing risk of falling    Frequency Min 1X/week        AM-PAC PT "6 Clicks" Mobility  Outcome Measure Help needed turning from your back to your side while in a flat bed without using bedrails?: None Help needed moving from lying on your back to sitting on the side of a flat bed without using bedrails?: None Help needed moving to and from a bed to a chair (including a wheelchair)?: A Little Help needed standing up from a chair using your arms (e.g., wheelchair or bedside chair)?: A Little Help needed to walk in hospital room?: A Little Help needed climbing 3-5 steps with a railing? : A Little 6 Click Score: 20    End of Session Equipment Utilized During Treatment: Gait belt Activity Tolerance: Patient tolerated treatment well Patient left: in bed;with call bell/phone within reach;with family/visitor present Nurse Communication: Mobility status PT Visit  Diagnosis: Unsteadiness on feet (R26.81);Other abnormalities of gait and mobility (R26.89);Muscle weakness (generalized) (M62.81)    Time: 1610-9604 PT Time Calculation (min) (ACUTE ONLY): 32 min   Charges:   PT Evaluation $PT Eval Low Complexity: 1 Low PT Treatments $Gait Training: 8-22 mins PT General Charges $$ ACUTE PT VISIT: 1 Visit        Orysia Blas, PT, DPT Secure Chat Preferred  Rehab Office [phone]  Alissa April Adela Ades 09/30/2023, 5:11 PM

## 2023-09-30 NOTE — Progress Notes (Signed)
   ADVANCED HEART FAILURE CONSULT NOTE  Referring Physician: Cody Das, MD  Primary Care: Elida Grounds, DO  CC: Acute on chronic HFpEF  Interval hx: - 3.4L urine output yesterday - Reports that she feels "great"   amLODipine   10 mg Oral Daily   atorvastatin   20 mg Oral Daily   ferrous sulfate   325 mg Oral BID   insulin  aspart  0-20 Units Subcutaneous TID WC   insulin  aspart  3 Units Subcutaneous TID WC   insulin  glargine-yfgn  30 Units Subcutaneous Daily   losartan   50 mg Oral Daily   pregabalin   75 mg Oral BID   pyridOXINE   100 mg Oral Daily   Rivaroxaban   15 mg Oral Q supper   sodium chloride  flush  3 mL Intravenous Q12H   thiamine   250 mg Oral Daily   [START ON 10/01/2023] torsemide   60 mg Oral Daily      PHYSICAL EXAM: Vitals:   09/30/23 0343 09/30/23 0736  BP: (!) 120/50 (!) 113/46  Pulse: 73 79  Resp: 14 (!) 21  Temp: 98.2 F (36.8 C) 98.9 F (37.2 C)  SpO2: 92% 98%   In SR with rate in the 70ss.  General:  comfortable Neck: JVP 8-9 Cor: PMI nondisplaced. RRR Lungs: normal work of breathing Abdomen: soft Extremities: no cyanosis, clubbing, rash, warm R and LLE 1+ edema Neuro: alert & oriented x3     DATA REVIEW  ECG: 09/28/23: NSR w/ LBBB & 1AVB  As per my personal interpretation  ECHO: 09/19/23: LVEF 60%; no WMA; normal RV function. AVR with no pathologic regurgitation as per my personal interpretation  ASSESSMENT & PLAN:  Acute on chronic HFpEF exacerbation - Short of breath on my exam with difficulty laying flat; +conversational dyspnea. +1 edema to knees with increased abdominal protuberance & JVP to the mandible. BNP 300.  - CXR with bilateral pulmonary edema; exp wheezing on exam.  - TTE from 09/19/23 shows a well positioned valve with no paravalvular regurgitation.  - Developed LBBB after procedure; unsure if this is playing a part in her symptoms. Her TTE does not demonstrate changes in wall motion 2/2 LBBB. Will repeat TTE.  - D/C  lasix ; start torsemide  60mg  daily.   2. Severe AS s/p TAVR - TAVR on 09/18/23 with no complications; well positioned valve on TTE from 09/19/23.  - Calla Catchings III 26mm - no murmur on exam.   3. HTN - Continue losartan . BP at goal.    Uzziah Rigg Advanced Heart Failure Mechanical Circulatory Support

## 2023-10-01 ENCOUNTER — Telehealth: Payer: Self-pay | Admitting: Cardiology

## 2023-10-01 ENCOUNTER — Other Ambulatory Visit: Payer: Self-pay

## 2023-10-01 ENCOUNTER — Other Ambulatory Visit (HOSPITAL_COMMUNITY): Payer: Self-pay

## 2023-10-01 DIAGNOSIS — I2510 Atherosclerotic heart disease of native coronary artery without angina pectoris: Secondary | ICD-10-CM | POA: Insufficient documentation

## 2023-10-01 DIAGNOSIS — Z79899 Other long term (current) drug therapy: Secondary | ICD-10-CM

## 2023-10-01 LAB — BASIC METABOLIC PANEL WITH GFR
Anion gap: 11 (ref 5–15)
BUN: 48 mg/dL — ABNORMAL HIGH (ref 8–23)
CO2: 21 mmol/L — ABNORMAL LOW (ref 22–32)
Calcium: 8.4 mg/dL — ABNORMAL LOW (ref 8.9–10.3)
Chloride: 92 mmol/L — ABNORMAL LOW (ref 98–111)
Creatinine, Ser: 1.56 mg/dL — ABNORMAL HIGH (ref 0.44–1.00)
GFR, Estimated: 35 mL/min — ABNORMAL LOW (ref >60)
Glucose, Bld: 146 mg/dL — ABNORMAL HIGH (ref 70–99)
Potassium: 3.4 mmol/L — ABNORMAL LOW (ref 3.5–5.1)
Sodium: 124 mmol/L — ABNORMAL LOW (ref 135–145)

## 2023-10-01 LAB — GLUCOSE, CAPILLARY
Glucose-Capillary: 134 mg/dL — ABNORMAL HIGH (ref 70–99)
Glucose-Capillary: 180 mg/dL — ABNORMAL HIGH (ref 70–99)

## 2023-10-01 LAB — MAGNESIUM: Magnesium: 2 mg/dL (ref 1.7–2.4)

## 2023-10-01 MED ORDER — TORSEMIDE 20 MG PO TABS: 20.0000 mg | ORAL_TABLET | ORAL | Status: DC | PRN

## 2023-10-01 MED ORDER — TORSEMIDE 20 MG PO TABS
60.0000 mg | ORAL_TABLET | Freq: Every day | ORAL | 6 refills | Status: DC
  Filled 2023-10-01 – 2023-10-02 (×2): qty 30, 10d supply, fill #0

## 2023-10-01 MED ORDER — POTASSIUM CHLORIDE CRYS ER 20 MEQ PO TBCR
60.0000 meq | EXTENDED_RELEASE_TABLET | Freq: Once | ORAL | Status: AC
  Administered 2023-10-01: 60 meq via ORAL
  Filled 2023-10-01: qty 3

## 2023-10-01 MED ORDER — TORSEMIDE 20 MG PO TABS
60.0000 mg | ORAL_TABLET | Freq: Once | ORAL | 6 refills | Status: DC
Start: 2023-10-01 — End: 2023-10-01
  Filled 2023-10-01: qty 30, 10d supply, fill #0

## 2023-10-01 MED ORDER — POTASSIUM CHLORIDE CRYS ER 20 MEQ PO TBCR: 40.0000 meq | EXTENDED_RELEASE_TABLET | Freq: Once | ORAL | Status: DC

## 2023-10-01 NOTE — Progress Notes (Signed)
 Progress Note  Patient Name: Danielle Beck Date of Encounter: 10/01/2023  Primary Cardiologist:   Cody Das, MD   Subjective   Breathing much better.  No chest pain.  Anxious to go home.   Inpatient Medications    Scheduled Meds:  amLODipine   10 mg Oral Daily   atorvastatin   20 mg Oral Daily   ferrous sulfate   325 mg Oral BID   insulin  aspart  0-20 Units Subcutaneous TID WC   insulin  aspart  3 Units Subcutaneous TID WC   insulin  glargine-yfgn  30 Units Subcutaneous Daily   losartan   50 mg Oral Daily   pregabalin   75 mg Oral BID   pyridOXINE   100 mg Oral Daily   Rivaroxaban   15 mg Oral Q supper   sodium chloride  flush  3 mL Intravenous Q12H   thiamine   250 mg Oral Daily   torsemide   60 mg Oral Daily   Continuous Infusions:  PRN Meds: acetaminophen , ipratropium-albuterol , methocarbamol , ondansetron  (ZOFRAN ) IV, sodium chloride  flush   Vital Signs    Vitals:   09/30/23 2320 10/01/23 0424 10/01/23 0634 10/01/23 0823  BP: (!) 106/46 (!) 113/41  (!) 120/55  Pulse: 80 73  69  Resp: 20 19  19   Temp: 98 F (36.7 C) 97.7 F (36.5 C)  98.6 F (37 C)  TempSrc: Oral Oral  Oral  SpO2: 98% 97%  97%  Weight:   83.3 kg   Height:        Intake/Output Summary (Last 24 hours) at 10/01/2023 1151 Last data filed at 10/01/2023 0720 Gross per 24 hour  Intake 240 ml  Output 1000 ml  Net -760 ml   Filed Weights   09/30/23 0649 09/30/23 1100 10/01/23 0634  Weight: 87.4 kg 82.6 kg 83.3 kg    Telemetry    NSR - Personally Reviewed  ECG    NA - Personally Reviewed  Physical Exam   GEN: No acute distress.   Neck: No  JVD Cardiac: RRR, no murmurs, rubs, or gallops.  Respiratory: Clear to auscultation bilaterally. GI: Soft, nontender, non-distended  MS:    Moderate thight edema; No deformity. Neuro:  Nonfocal  Psych: Normal affect   Labs    Chemistry Recent Labs  Lab 09/29/23 0359 09/30/23 0411 10/01/23 0343  NA 125* 122* 124*  K 3.1* 3.6 3.4*   CL 91* 90* 92*  CO2 21* 21* 21*  GLUCOSE 105* 152* 146*  BUN 44* 43* 48*  CREATININE 1.38* 1.44* 1.56*  CALCIUM  8.6* 8.4* 8.4*  GFRNONAA 41* 39* 35*  ANIONGAP 13 11 11      Hematology Recent Labs  Lab 09/28/23 1634  WBC 11.2*  RBC 2.88*  HGB 8.7*  HCT 25.6*  MCV 88.9  MCH 30.2  MCHC 34.0  RDW 12.8  PLT 251    Cardiac EnzymesNo results for input(s): "TROPONINI" in the last 168 hours. No results for input(s): "TROPIPOC" in the last 168 hours.   BNP Recent Labs  Lab 09/28/23 1635  BNP 303.0*     DDimer No results for input(s): "DDIMER" in the last 168 hours.   Radiology    No results found.  Cardiac Studies   Echo:  09/29/23  1. Septal hypokinesis . Left ventricular ejection fraction, by  estimation, is 50 to 55%. The left ventricle has low normal function. The  left ventricular internal cavity size was mildly dilated.   2. Right ventricular systolic function is normal. The right ventricular  size is  normal.   3. The mitral valve is abnormal. Mild to moderate mitral valve  regurgitation. Moderate mitral annular calcification.   4. Post TAVR with 26 mm Sapien valve no significant PVL mean gradient  stable 6 mmHg . The aortic valve has been repaired/replaced. There is a 26  mm Edwards Sapien prosthetic (TAVR) valve present in the aortic position.  Procedure Date: 09/18/23.   Patient Profile     73 y.o. female hypertension, diabetes mellitus, HFpEF, paroxysmal Afib/flutter, PSVT, s/p TAVR Brookie Cantor III 26 mm) 08/2023 .  Admitted with acute SOB.     Assessment & Plan    Acute on chronic diastolic HF:  Net negative 6.9 liters.  Changed to Torsemide  po yesterday.  Her husband is a retired MD and wants her to go home.  He is aware of the low Na and increased creat which is not far from baseline.  He wants to wait on repeat labs which is fine but should have labs done in our office next week with BMET.  Home on Torsemide  60 mg am and PRN 20 if weight gain.      Severe AS s/p TAVR:  Normal TAVR function echo 4/22 as above.  No change in therapy.    MR:  Mild to moderate.  Follow clinically.    HTN:  This is being managed in the context of treating his CHF   For questions or updates, please contact [facility] Please consult [email or web address] for contact info under Cardiology/STEMI.   Signed, Eilleen Grates, MD  10/01/2023, 11:51 AM

## 2023-10-01 NOTE — Telephone Encounter (Signed)
 Spoke with husband per DPR and he is aware patient will need BMET in one week.    Order placed.

## 2023-10-01 NOTE — Discharge Summary (Addendum)
 Discharge Summary    Patient ID: Danielle Beck MRN: [id number]; DOB: 12-28-1950  Admit date: 09/28/2023 Discharge date: 10/01/2023  PCP:  Elida Grounds, DO   [facility] Providers Cardiologist:  Cody Das, MD  Structural Heart:  Antoinette Batman, MD {  Discharge Diagnoses    Principal Problem:   Acute on chronic heart failure with preserved ejection fraction (HFpEF) (HCC) Active Problems:   Essential hypertension   PAF (paroxysmal atrial fibrillation) (HCC)   Type 2 diabetes mellitus with hyperlipidemia (HCC)   Mixed hyperlipidemia   S/P TAVR (transcatheter aortic valve replacement)  Edwards Sapien 3 THV (size 26 mm) 09/18/2023   Nonobstructive CAD (coronary artery disease)    Diagnostic Studies/Procedures    Limited echocardiogram 09/29/2023  1. Septal hypokinesis . Left ventricular ejection fraction, by  estimation, is 50 to 55%. The left ventricle has low normal function. The  left ventricular internal cavity size was mildly dilated.   2. Right ventricular systolic function is normal. The right ventricular  size is normal.   3. The mitral valve is abnormal. Mild to moderate mitral valve  regurgitation. Moderate mitral annular calcification.   4. Post TAVR with 26 mm Sapien valve no significant PVL mean gradient  stable 6 mmHg . The aortic valve has been repaired/replaced. There is a 26  mm Edwards Sapien prosthetic (TAVR) valve present in the aortic position.  Procedure Date: 09/18/23.   Coronary angiography 08/03/2023: LM: Normal LAD: Mild calcification          Prox-mid 30% disease Lcx: Large vessel        50% mid stenosis between ectatic segment on either side RCA: Mild calcification with mild diffuse disease      Right heart catheterization 08/03/2023: RA: 9 mmHg RV: 44/4 mmHg PA: 42/20 mmHg, mPAP 36 mmHg PCW: 24 mmHg   AO sats: 92% PA sats: 57%   CO: 5.0 L/min CI: 2.7 L/min/m2   Two vessel nonobstructive CAD No proximal  CAD Moderate PH, WHO Grp II Mildly elevated filling pressures   Patient received 800 cc fluid pre cath. Will stop additional fluids given mildly elevated PCWP, and give IV lasix  20 mg. Okay to resume Xarelto  on 3/8 AM   Manish Corliss Dies, MD   _____________   History of Present Illness     Danielle Beck is a 73 y.o. female with chronic HFpEF, status post recent TAVR 08/2023, diabetes, paroxysmal atrial fibrillation/flutter, PSVT.  Since her TAVR she has had at least 1 ER visit with complaints of shortness of breath and peripheral edema.  Was seen outpatient and had been reporting continued/persistent symptoms.  She then had another episode where she woke up in the middle the night with shortness of breath and chest heaviness and orthopnea with labored breathing.  Was taking torsemide  40 mg twice daily without improvement in symptoms.  Sent from the office to the emergency room for IV diuretics.   Hospital Course     Consultants:    Husband is a retired MD and requested discharge despite low sodium and increasing creatinine.  Acute on chronic HFpEF - [facility] 07/2023 moderate pulmonary hypertension, group 2 with mildly elevated filling pressures. Despite successful TAVR with no perivalvular leaks she has had persistent complaints of peripheral edema and shortness of breath despite compliancy with home diuretic regiment.  She has been diuresed on IV Lasix  80 mg twice daily now transition to p.o. with improvement in symptoms.   Continue with torsemide  60 mg daily,  can take extra dose of 20 mg for weight gain/symptoms. Weight at discharge 183 pounds  Hyponatremia Sodium seems to have gradually declined over the past couple weeks.  It was normal on 4/23 today it is 124.  Need to closely monitor this outpatient, could be dilutional but has not improved despite diuresis.  Mild nonobstructive CAD Two-vessel nonobstructive CAD with 30% stenosis in proximal to mid LAD and 50% stenosis in  LCx.   Continue with atorvastatin  20 mg  Severe AS status post TAVR Edwards SAPIEN III 26mm TAVR on 09/19/2023 with no complications and well-seated valve with no perivalvular leaks.  Paroxysmal atrial fibrillation Maintaining sinus rhythm Continue Xarelto  15 mg given CKD, Lopressor  25 mg twice daily  Mild MR Continue to follow outpatient.  Mean gradient 4.  Hypertension Well-controlled. Continue with amlodipine  10 mg, losartan  50 mg, Lopressor   Diabetes A1c 6.5% 3 days ago.  Patient seen and examined by Dr. Lavonne Prairie and deemed stable to discharge.  Follow-up has been arranged.  Medication sent to [facility].  We will follow-up closely with her on the 19th.  Did the patient have an acute coronary syndrome (MI, NSTEMI, STEMI, etc) this admission?:  No                               Did the patient have a percutaneous coronary intervention (stent / angioplasty)?:  No.          _____________  Discharge Vitals Blood pressure (!) 126/54, pulse 69, temperature 98 F (36.7 C), temperature source Oral, resp. rate 16, height 5\' 2"  (1.575 m), weight 83.3 kg, SpO2 97%.  Filed Weights   09/30/23 0649 09/30/23 1100 10/01/23 0634  Weight: 87.4 kg 82.6 kg 83.3 kg    Labs & Radiologic Studies    CBC Recent Labs    09/28/23 1634  WBC 11.2*  NEUTROABS 7.8*  HGB 8.7*  HCT 25.6*  MCV 88.9  PLT 251   Basic Metabolic Panel Recent Labs    29/56/21 0411 10/01/23 0338 10/01/23 0343  NA 122*  --  124*  K 3.6  --  3.4*  CL 90*  --  92*  CO2 21*  --  21*  GLUCOSE 152*  --  146*  BUN 43*  --  48*  CREATININE 1.44*  --  1.56*  CALCIUM  8.4*  --  8.4*  MG  --  2.0  --    Liver Function Tests No results for input(s): "AST", "ALT", "ALKPHOS", "BILITOT", "PROT", "ALBUMIN" in the last 72 hours. No results for input(s): "LIPASE", "AMYLASE" in the last 72 hours. High Sensitivity Troponin:   Recent Labs  Lab 09/21/23 0154 09/21/23 0340  TROPONINIHS 130* 136*     BNP Invalid input(s): "POCBNP" D-Dimer No results for input(s): "DDIMER" in the last 72 hours. Hemoglobin A1C Recent Labs    09/28/23 1635  HGBA1C 6.5*   Fasting Lipid Panel No results for input(s): "CHOL", "HDL", "LDLCALC", "TRIG", "CHOLHDL", "LDLDIRECT" in the last 72 hours. Thyroid Function Tests No results for input(s): "TSH", "T4TOTAL", "T3FREE", "THYROIDAB" in the last 72 hours.  Invalid input(s): "FREET3" _____________  ECHOCARDIOGRAM LIMITED Result Date: 09/29/2023    ECHOCARDIOGRAM LIMITED REPORT   Patient Name:   Danielle Beck Date of Exam: 09/29/2023 Medical Rec #:  [id number]          Height:       62.0 in Accession #:    [id number]  Weight:       184.9 lb Date of Birth:  1950/12/16          BSA:          1.849 m Patient Age:    73 years           BP:           127/61 mmHg Patient Gender: F                  HR:           80 bpm. Exam Location:  Inpatient Procedure: 2D Echo, Color Doppler and Cardiac Doppler (Both Spectral and Color            Flow Doppler were utilized during procedure). Indications:    R06.02 SOB  History:        Patient has prior history of Echocardiogram examinations, most                 recent 09/19/2023. Arrythmias:Atrial Fibrillation; Risk                 Factors:Hypertension, Dyslipidemia and Diabetes.                 Aortic Valve: 26 mm Edwards Sapien prosthetic, stented (TAVR)                 valve is present in the aortic position. Procedure Date:                 09/18/23.  Sonographer:    Sherline Distel Senior RDCS Referring Phys: [phone] AMY D CLEGG  Sonographer Comments: Full TAVR Evaluation 09/19/23 IMPRESSIONS  1. Septal hypokinesis . Left ventricular ejection fraction, by estimation, is 50 to 55%. The left ventricle has low normal function. The left ventricular internal cavity size was mildly dilated.  2. Right ventricular systolic function is normal. The right ventricular size is normal.  3. The mitral valve is abnormal. Mild to moderate mitral valve  regurgitation. Moderate mitral annular calcification.  4. Post TAVR with 26 mm Sapien valve no significant PVL mean gradient stable 6 mmHg . The aortic valve has been repaired/replaced. There is a 26 mm Edwards Sapien prosthetic (TAVR) valve present in the aortic position. Procedure Date: 09/18/23. FINDINGS  Left Ventricle: Septal hypokinesis. Left ventricular ejection fraction, by estimation, is 50 to 55%. The left ventricle has low normal function. The left ventricular internal cavity size was mildly dilated. Right Ventricle: The right ventricular size is normal. Right vetricular wall thickness was not assessed. Right ventricular systolic function is normal. Pericardium: There is no evidence of pericardial effusion. Mitral Valve: The mitral valve is abnormal. There is moderate thickening of the mitral valve leaflet(s). There is moderate calcification of the mitral valve leaflet(s). Moderate mitral annular calcification. Mild to moderate mitral valve regurgitation. MV peak gradient, 7.1 mmHg. The mean mitral valve gradient is 5.0 mmHg. Tricuspid Valve: Tricuspid valve regurgitation is mild. Aortic Valve: Post TAVR with 26 mm Sapien valve no significant PVL mean gradient stable 6 mmHg. The aortic valve has been repaired/replaced. Aortic valve mean gradient measures 6.0 mmHg. Aortic valve peak gradient measures 10.8 mmHg. Aortic valve area, by VTI measures 2.61 cm. There is a 26 mm Edwards Sapien prosthetic, stented (TAVR) valve present in the aortic position. Procedure Date: 09/18/23. Additional Comments: Spectral Doppler performed. Color Doppler performed.  LEFT VENTRICLE PLAX 2D LVOT diam:     2.40 cm LV SV:  82 LV SV Index:   45 LVOT Area:     4.52 cm  AORTIC VALVE AV Area (Vmax):    2.68 cm AV Area (Vmean):   2.72 cm AV Area (VTI):     2.61 cm AV Vmax:           164.00 cm/s AV Vmean:          117.000 cm/s AV VTI:            0.315 m AV Peak Grad:      10.8 mmHg AV Mean Grad:      6.0 mmHg LVOT Vmax:          97.10 cm/s LVOT Vmean:        70.400 cm/s LVOT VTI:          0.182 m LVOT/AV VTI ratio: 0.58 MITRAL VALVE MV Area VTI:  2.30 cm    SHUNTS MV Peak grad: 7.1 mmHg    Systemic VTI:  0.18 m MV Mean grad: 5.0 mmHg    Systemic Diam: 2.40 cm MV Vmax:      1.33 m/s MV Vmean:     107.5 cm/s Janelle Mediate MD Electronically signed by Janelle Mediate MD Signature Date/Time: 09/29/2023/12:36:04 PM    Final    DG CHEST PORT 1 VIEW Result Date: 09/28/2023 CLINICAL DATA:  Dyspnea. EXAM: PORTABLE CHEST 1 VIEW COMPARISON:  09/21/2023. FINDINGS: Mild-to-moderate diffuse pulmonary vascular congestion with bilateral hilar and bibasilar predominance. There is blunting of bilateral lateral costophrenic angles, suggesting bilateral trace pleural effusions. Stable cardio-mediastinal silhouette. Prosthetic aortic valve noted. No acute osseous abnormalities. The soft tissues are within normal limits. IMPRESSION: *Findings favor congestive heart failure/pulmonary edema. Electronically Signed   By: Beula Brunswick M.D.   On: 09/28/2023 16:42   DG Chest Port 1 View Result Date: 09/21/2023 CLINICAL DATA:  Shortness of breath EXAM: PORTABLE CHEST 1 VIEW COMPARISON:  09/14/2023 FINDINGS: Mild cardiomegaly. Aortic valvuloplasty. Right basilar atelectasis. No focal airspace consolidation or pulmonary edema. IMPRESSION: Mild cardiomegaly and right basilar atelectasis. Electronically Signed   By: Juanetta Nordmann M.D.   On: 09/21/2023 03:03   ECHOCARDIOGRAM COMPLETE Result Date: 09/19/2023    ECHOCARDIOGRAM REPORT   Patient Name:   Danielle Beck Date of Exam: 09/19/2023 Medical Rec #:  [id number]          Height:       61.5 in Accession #:    [id number]         Weight:       180.5 lb Date of Birth:  04/25/1951          BSA:          1.819 m Patient Age:    73 years           BP:           138/75 mmHg Patient Gender: F                  HR:           95 bpm. Exam Location:  Inpatient Procedure: 2D Echo, Cardiac Doppler and Color Doppler (Both  Spectral and Color            Flow Doppler were utilized during procedure). Indications:    Post TAVR evaluation  History:        Patient has prior history of Echocardiogram examinations, most                 recent 04/19/2024. CHF,  Arrythmias:Atrial Fibrillation; Risk                 Factors:Dyslipidemia, Diabetes and Hypertension.                 Aortic Valve: 26 mm Sapien prosthetic, stented (TAVR) valve is                 present in the aortic position.  Sonographer:    Juanita Shaw Referring Phys: [id number] KATHRYN R THOMPSON IMPRESSIONS  1. Left ventricular ejection fraction, by estimation, is 60 to 65%. The left ventricle has normal function. The left ventricle has no regional wall motion abnormalities. The left ventricular internal cavity size was mildly dilated. Left ventricular diastolic parameters are indeterminate. Elevated left ventricular end-diastolic pressure.  2. Right ventricular systolic function is normal. The right ventricular size is normal. Tricuspid regurgitation signal is inadequate for assessing PA pressure.  3. The mitral valve is degenerative. Mild mitral valve regurgitation. No evidence of mitral stenosis. The mean mitral valve gradient is 4.0 mmHg.  4. The aortic valve has been repaired/replaced. Aortic valve regurgitation is not visualized. No aortic stenosis is present. There is a 26 mm Sapien prosthetic (TAVR) valve present in the aortic position. Aortic valve area, by VTI measures 2.54 cm. Aortic valve mean gradient measures 5.8 mmHg. Aortic valve Vmax measures 1.70 m/s.  5. The inferior vena cava is normal in size with greater than 50% respiratory variability, suggesting right atrial pressure of 3 mmHg.  6. Compared to study dated 09/18/2023, the mean TAVR gradient has increased slightly from 3 to 5.76mmHg, DI has increased from 0.47 to 0.81. Normal functioning TAVR with no perivalular leak. FINDINGS  Left Ventricle: Left ventricular ejection fraction, by estimation, is 60 to 65%. The  left ventricle has normal function. The left ventricle has no regional wall motion abnormalities. The left ventricular internal cavity size was mildly dilated. There is  no left ventricular hypertrophy. Left ventricular diastolic parameters are indeterminate. Elevated left ventricular end-diastolic pressure. Right Ventricle: The right ventricular size is normal. No increase in right ventricular wall thickness. Right ventricular systolic function is normal. Tricuspid regurgitation signal is inadequate for assessing PA pressure. Left Atrium: Left atrial size was normal in size. Right Atrium: Right atrial size was normal in size. Pericardium: There is no evidence of pericardial effusion. Mitral Valve: The mitral valve is degenerative in appearance. There is mild calcification of the anterior mitral valve leaflet(s). Mild mitral annular calcification. Mild mitral valve regurgitation. No evidence of mitral valve stenosis. MV peak gradient,  7.1 mmHg. The mean mitral valve gradient is 4.0 mmHg. Tricuspid Valve: The tricuspid valve is normal in structure. Tricuspid valve regurgitation is not demonstrated. No evidence of tricuspid stenosis. Aortic Valve: The aortic valve has been repaired/replaced. Aortic valve regurgitation is not visualized. No aortic stenosis is present. Aortic valve mean gradient measures 5.8 mmHg. Aortic valve peak gradient measures 11.5 mmHg. Aortic valve area, by VTI  measures 2.54 cm. There is a 26 mm Sapien prosthetic, stented (TAVR) valve present in the aortic position. Pulmonic Valve: The pulmonic valve was normal in structure. Pulmonic valve regurgitation is not visualized. No evidence of pulmonic stenosis. Aorta: The aortic root is normal in size and structure. Venous: The inferior vena cava is normal in size with greater than 50% respiratory variability, suggesting right atrial pressure of 3 mmHg. IAS/Shunts: No atrial level shunt detected by color flow Doppler.  LEFT VENTRICLE PLAX 2D LVIDd:  5.50 cm      Diastology LVIDs:         3.40 cm      LV e' medial:    4.90 cm/s LV PW:         0.80 cm      LV E/e' medial:  26.3 LV IVS:        0.90 cm      LV e' lateral:   7.83 cm/s LVOT diam:     2.00 cm      LV E/e' lateral: 16.5 LV SV:         70 LV SV Index:   39 LVOT Area:     3.14 cm  LV Volumes (MOD) LV vol d, MOD A2C: 103.0 ml LV vol d, MOD A4C: 116.0 ml LV vol s, MOD A2C: 42.7 ml LV vol s, MOD A4C: 42.0 ml LV SV MOD A2C:     60.3 ml LV SV MOD A4C:     116.0 ml LV SV MOD BP:      71.4 ml RIGHT VENTRICLE             IVC RV Basal diam:  3.50 cm     IVC diam: 1.20 cm RV Mid diam:    3.30 cm RV S prime:     12.90 cm/s TAPSE (M-mode): 2.7 cm LEFT ATRIUM             Index        RIGHT ATRIUM           Index LA diam:        4.50 cm 2.47 cm/m   RA Area:     14.00 cm LA Vol (A2C):   49.3 ml 27.10 ml/m  RA Volume:   32.70 ml  17.98 ml/m LA Vol (A4C):   35.1 ml 19.29 ml/m LA Biplane Vol: 41.5 ml 22.81 ml/m  AORTIC VALVE                     PULMONIC VALVE AV Area (Vmax):    2.45 cm      PV Vmax:       1.05 m/s AV Area (Vmean):   2.40 cm      PV Peak grad:  4.4 mmHg AV Area (VTI):     2.54 cm AV Vmax:           169.60 cm/s AV Vmean:          111.400 cm/s AV VTI:            0.278 m AV Peak Grad:      11.5 mmHg AV Mean Grad:      5.8 mmHg LVOT Vmax:         132.00 cm/s LVOT Vmean:        85.000 cm/s LVOT VTI:          0.224 m LVOT/AV VTI ratio: 0.81  AORTA Ao Root diam: 3.20 cm Ao Asc diam:  3.20 cm MITRAL VALVE MV Area (PHT): 5.46 cm     SHUNTS MV Area VTI:   2.69 cm     Systemic VTI:  0.22 m MV Peak grad:  7.1 mmHg     Systemic Diam: 2.00 cm MV Mean grad:  4.0 mmHg MV Vmax:       1.33 m/s MV Vmean:      100.0 cm/s MV Decel Time: 139 msec MR Peak grad: 117.5 mmHg MR Vmax:      542.00  cm/s MV E velocity: 129.00 cm/s MV A velocity: 151.00 cm/s MV E/A ratio:  0.85 Gaylyn Keas MD Electronically signed by Gaylyn Keas MD Signature Date/Time: 09/19/2023/12:11:19 PM    Final    Structural Heart  Procedure Result Date: 09/18/2023 See surgical note for result.  ECHOCARDIOGRAM LIMITED Result Date: 09/18/2023    ECHOCARDIOGRAM LIMITED REPORT   Patient Name:   Danielle Beck Date of Exam: 09/18/2023 Medical Rec #:  [id number]          Height:       61.5 in Accession #:    [id number]         Weight:       183.0 lb Date of Birth:  1950-12-08          BSA:          1.830 m Patient Age:    73 years           BP:           153/72 mmHg Patient Gender: F                  HR:           80 bpm. Exam Location:  Inpatient Procedure: Limited Echo, Cardiac Doppler and Color Doppler (Both Spectral and            Color Flow Doppler were utilized during procedure). Indications:    Aortic stenosis I35.0  History:        Patient has prior history of Echocardiogram examinations, most                 recent 07/23/2023. CHF, Aortic Valve Disease; Risk                 Factors:Diabetes and Hypertension.  Sonographer:    Astrid Blamer Referring Phys: [id number] CHRISTOPHER D MCALHANY IMPRESSIONS  1. Left ventricular ejection fraction, by estimation, is 60 to 65%. The left ventricle has normal function. There is mild left ventricular hypertrophy.  2. Right ventricular systolic function is normal. The right ventricular size is normal.  3. The mitral valve is abnormal. Mild to moderate mitral valve regurgitation. Moderate mitral annular calcification.  4. Pre TAVR : calcified tri leaflet AV with mild AR. Severe AS with mean gradient 31 peak 51 mmHg and AVA 0.69 cm2         Post TAVR: well positioned 26 mm Sapien 3 valve mean gradient 3 peak 5 mmHg no significant PVL AVA 2.3 cm2. The aortic valve has been repaired/replaced. Aortic valve regurgitation is trivial. FINDINGS  Left Ventricle: Left ventricular ejection fraction, by estimation, is 60 to 65%. The left ventricle has normal function. The left ventricular internal cavity size was normal in size. There is mild left ventricular hypertrophy. Right Ventricle: The right ventricular size is  normal. Right vetricular wall thickness was not assessed. Right ventricular systolic function is normal. Left Atrium: Left atrial size was normal in size. Right Atrium: Right atrial size was normal in size. Pericardium: There is no evidence of pericardial effusion. Mitral Valve: The mitral valve is abnormal. There is moderate thickening of the mitral valve leaflet(s). There is moderate calcification of the mitral valve leaflet(s). Moderate mitral annular calcification. Mild to moderate mitral valve regurgitation. Aortic Valve: Pre TAVR : calcified tri leaflet AV with mild AR. Severe AS with mean gradient 31 peak 51 mmHg and AVA 0.69 cm2 Post TAVR: well positioned 26 mm Sapien 3 valve mean gradient 3 peak 5 mmHg  no significant PVL AVA 2.3 cm2. The aortic valve has been repaired/replaced. Aortic valve regurgitation is trivial. Aortic valve mean gradient measures 12.3 mmHg. Aortic valve peak gradient measures 20.9 mmHg. Aortic valve area, by VTI measures 1.32 cm. LEFT VENTRICLE PLAX 2D LVOT diam:     1.90 cm LV SV:         63 LV SV Index:   35 LVOT Area:     2.84 cm  AORTIC VALVE AV Area (Vmax):    1.11 cm AV Area (Vmean):   1.34 cm AV Area (VTI):     1.32 cm AV Vmax:           228.50 cm/s AV Vmean:          141.067 cm/s AV VTI:            0.479 m AV Peak Grad:      20.9 mmHg AV Mean Grad:      12.3 mmHg LVOT Vmax:         89.40 cm/s LVOT Vmean:        66.900 cm/s LVOT VTI:          0.223 m LVOT/AV VTI ratio: 0.47  SHUNTS Systemic VTI:  0.22 m Systemic Diam: 1.90 cm Janelle Mediate MD Electronically signed by Janelle Mediate MD Signature Date/Time: 09/18/2023/3:16:20 PM    Final    DG Chest 2 View Result Date: 09/14/2023 CLINICAL DATA:  Preop evaluation, aortic stenosis EXAM: CHEST - 2 VIEW COMPARISON:  08/21/2022 FINDINGS: The heart size and mediastinal contours are within normal limits. Both lungs are clear. The visualized skeletal structures are unremarkable except for a chronic compression fracture at T11. Trachea  midline. Aorta atherosclerotic. IMPRESSION: No active cardiopulmonary disease. Electronically Signed   By: Melven Stable.  Shick M.D.   On: 09/14/2023 10:13   Disposition   Pt is being discharged home today in good condition.  Follow-up Plans & Appointments     Follow-up Information     Patwardhan, Kaye Parsons, MD Follow up.   Specialties: Cardiology, Radiology Why: 10/15/2023 at 3:00 PM Contact information: [address] [place] [address] [phone]                   Discharge Medications   Allergies as of 10/01/2023   No Known Allergies      Medication List     TAKE these medications    amLODipine  10 MG tablet Commonly known as: NORVASC  Take 1 tablet (10 mg total) by mouth daily.   amoxicillin  500 MG capsule Commonly known as: AMOXIL  Take 4 capsules (2,000 mg total) by mouth as directed. 1 hour prior to dental work including cleanings   Artificial Tears ophthalmic solution Place 1 drop into both eyes in the morning and at bedtime.   atorvastatin  10 MG tablet Commonly known as: LIPITOR Take 2 tablets (20 mg total) by mouth daily for cholesterol.   CALCIUM  600 + D PO Take 1 tablet by mouth 2 (two) times daily.   cyanocobalamin 1000 MCG tablet Commonly known as: VITAMIN B12 Take 1,000 mcg by mouth every evening.   dorzolamide  2 % ophthalmic solution Commonly known as: TRUSOPT  Place 1 drop into the left eye 2 (two) times daily.   Easy Touch Alcohol  Prep Medium 70 % Pads Use as directed aily   Iron 325 (65 Fe) MG Tabs Take 325 mg by mouth 2 (two) times daily.   Lantus  SoloStar 100 UNIT/ML Solostar Pen Generic drug: insulin  glargine Inject 34 Units into  the skin daily. What changed: how much to take   latanoprost  0.005 % ophthalmic solution Commonly known as: XALATAN  Place 1 drop into both eyes at bedtime.   losartan  50 MG tablet Commonly known as: COZAAR  Take 1 tablet (50 mg total) by mouth daily for blood pressure.   metFORMIN  1000 MG  tablet Commonly known as: GLUCOPHAGE  Take 1 tablet (1,000 mg total) by mouth 2 (two) times daily with a meal.   methocarbamol  500 MG tablet Commonly known as: ROBAXIN  Take 2 tablets (1,000 mg total) by mouth daily as needed for muscle spasms. What changed: when to take this   metoprolol  tartrate 25 MG tablet Commonly known as: LOPRESSOR  Take 1 tablet (25 mg total) by mouth 2 (two) times daily.   nitroGLYCERIN  0.4 MG SL tablet Commonly known as: NITROSTAT  Place 0.4 mg under the tongue every 5 (five) minutes as needed for chest pain.   OneTouch Delica Plus Lancet33G Misc Use to check blood sugar daily.   OneTouch Verio Flex System w/Device Kit Use as directed.   OneTouch Verio test strip Generic drug: glucose blood For use when checking blood sugars twice daily.   pregabalin  75 MG capsule Commonly known as: LYRICA  Take 1 capsule (75 mg total) by mouth 2 (two) times daily. What changed:  how much to take when to take this   pyridOXINE  100 MG tablet Commonly known as: VITAMIN B6 Take 100 mg by mouth every evening.   Sure Comfort Pen Needles 32G X 6 MM Misc Generic drug: Insulin  Pen Needle Use to inject insulin  once daily.   timolol  0.5 % ophthalmic solution Commonly known as: TIMOPTIC  Place 1 drop into the left eye 2 (two) times daily.   torsemide  20 MG tablet Commonly known as: DEMADEX  Take 3 tablets (60 mg total) by mouth once for 1 dose. What changed:  how much to take when to take this   traMADol  50 MG tablet Commonly known as: ULTRAM  Take 1 tablet (50 mg total) by mouth every 6 (six) hours as needed for moderate pain   vitamin B-1 250 MG tablet Take 250 mg by mouth every evening.   Vitamin D3 1000 units Caps Take 1,000 Units by mouth every evening.   Xarelto  15 MG Tabs tablet Generic drug: Rivaroxaban  TAKE 1 TABLET EVERY DAY WITH SUPPER           Outstanding Labs/Studies   BMP 1 week   Duration of Discharge Encounter: APP Time: 30  minutes   Signed, Burnetta Cart, PA-C 10/01/2023, 12:52 PM  Patient seen and examined.  Plan as discussed in my rounding note for today and outlined above. Royston Cornea Sholonda Jobst  10/01/2023  1:20 PM

## 2023-10-01 NOTE — TOC Initial Note (Addendum)
 Transition of Care [facility]) - Initial/Assessment Note    Patient Details  Name: Danielle Beck MRN: [id number] Date of Birth: [birth year]  [facility]) CM/SW Contact:    Ernst Heap Phone Number: [phone] 10/01/2023, 12:01 PM  Clinical Narrative:    HF CSW met with patient and husband at bedside. Patient and husband stated that they live alone. Patient stated that in the past she has used [employer] services, but does not have anything current. Patient stated that she uses a cane at home. Patient stated that she has a scale at home. Patient stated that she has a PCP. CSW explained that a hospital follow up appointment is typically scheduled closer towards dc. Patient stated that she already has a hospital follow up appointment scheduled and she sees her PCP every 6 months. Patients husband will provide transportation at dc.   TOC will continue following.                      Patient Goals and CMS Choice            Expected Discharge Plan and Services                                              Prior Living Arrangements/Services                       Activities of Daily Living      Permission Sought/Granted                  Emotional Assessment              Admission diagnosis:  Acute on chronic heart failure with preserved ejection fraction (HFpEF) (HCC) [I50.33] Patient Active Problem List   Diagnosis Date Noted   Acute on chronic heart failure with preserved ejection fraction (HFpEF) (HCC) 09/28/2023   S/P TAVR (transcatheter aortic valve replacement)  Edwards Sapien 3 THV (size 26 mm) 09/18/2023 09/18/2023   Mixed hyperlipidemia 03/16/2023   Class 1 obesity 08/20/2022   Type 2 diabetes mellitus with hyperlipidemia (HCC) 08/19/2022   Normocytic anemia 08/19/2022   Moderate mitral regurgitation 04/02/2020   Severe aortic stenosis 04/02/2020   PAF (paroxysmal atrial fibrillation) (HCC) 10/15/2019   Essential  hypertension 06/27/2019   Chronic heart failure with preserved ejection fraction (HCC) 06/27/2019   Hyponatremia 03/21/2019   PCP:  Elida Grounds, DO Pharmacy:   [facility] [address] [place] [address] Phone: [phone] Fax: [phone]     Social Drivers of Health (SDOH) Social History: SDOH Screenings   Food Insecurity: No Food Insecurity (09/28/2023)  Housing: Low Risk  (09/28/2023)  Transportation Needs: No Transportation Needs (09/28/2023)  Utilities: Not At Risk (09/28/2023)  Social Connections: Unknown (09/28/2023)  Tobacco Use: Low Risk  (09/28/2023)   SDOH Interventions:     Readmission Risk Interventions    08/22/2022   12:27 PM 08/22/2022   12:26 PM  Readmission Risk Prevention Plan  Transportation Screening  Complete  PCP or Specialist Appt within 5-7 Days Not Complete Complete  Not Complete comments apt for 08/31/22   Home Care Screening  Complete  Medication Review (RN CM)  Complete

## 2023-10-01 NOTE — Addendum Note (Signed)
 Addended by: Juda Norse on: 10/01/2023 01:44 PM   Modules accepted: Orders

## 2023-10-01 NOTE — Progress Notes (Signed)
 Discharge instructions reviewed with pt and her husband.  Copy of instructions given to pt/husband. Pt/husband made aware of script sent to [facility] (pt's pharmacy) --for demadex --pt has some at home husband states, will get when needed.  Pt getting dressed at this time.  Pt will be d/c'd via wheelchair with belongings, with her husband and will be     escorted by staff/hospital volunteer.  Darah Simkin,RN SWOT

## 2023-10-01 NOTE — Progress Notes (Signed)
 Heart Failure Navigator Progress Note  Assessed for Heart & Vascular TOC clinic readiness.  Patient does not meet criteria due to Advanced Heart Failure Team consulted. .   Navigator will sign off at this time.   Rhae Hammock, BSN, Scientist, clinical (histocompatibility and immunogenetics) Only

## 2023-10-01 NOTE — Evaluation (Signed)
 Occupational Therapy Evaluation Patient Details Name: Danielle Beck MRN: [id number] DOB: May 29, 1951 Today's Date: 10/01/2023   History of Present Illness   73 y.o. female presents to [facility] for SOB and LE edema. CXR B pulmonary edema. Admitted with acute on chronic HFpEF. PMHx: HFpEF, DM, HTN, PAF, PSVT s/p TAVR 09/18/23     Clinical Impressions Pt presents with decline in function and safety with impaired strength, balance and endurance. PTA pt lives with her husband and was Ind with bathing, dressing, toileting, cooking, does not drive, husband assists with getting in and out of tub, with driving, and getting groceries. Has staff come to clean, uses cane for mobility. Pt currently requires CGA with LB ADLs and mobility using RW, HHA. Pt would benefit from acute OT services to maximize level of function and safety.       If plan is discharge home, recommend the following:   A little help with bathing/dressing/bathroom;A little help with walking and/or transfers;Assistance with cooking/housework;Assist for transportation;Help with stairs or ramp for entrance     Functional Status Assessment   Patient has had a recent decline in their functional status and demonstrates the ability to make significant improvements in function in a reasonable and predictable amount of time.     Equipment Recommendations   None recommended by OT     Recommendations for Other Services         Precautions/Restrictions   Precautions Precautions: Fall Restrictions Weight Bearing Restrictions Per Provider Order: No     Mobility Bed Mobility Overal bed mobility: Modified Independent                  Transfers Overall transfer level: Needs assistance Equipment used: Rolling walker (2 wheels) Transfers: Sit to/from Stand Sit to Stand: Contact guard assist           General transfer comment: CGA for safety and stability      Balance Overall balance assessment: Needs  assistance, Mild deficits observed, not formally tested   Sitting balance-Leahy Scale: Good     Standing balance support: No upper extremity supported, During functional activity Standing balance-Leahy Scale: Fair                             ADL either performed or assessed with clinical judgement   ADL Overall ADL's : Needs assistance/impaired Eating/Feeding: Independent   Grooming: Wash/dry hands;Wash/dry face;Contact guard assist;Standing;With caregiver independent assisting   Upper Body Bathing: Set up;Modified independent   Lower Body Bathing: Contact guard assist   Upper Body Dressing : Set up;Modified independent   Lower Body Dressing: Contact guard assist   Toilet Transfer: Contact guard assist;Ambulation   Toileting- Clothing Manipulation and Hygiene: Contact guard assist;Sit to/from stand       Functional mobility during ADLs: Contact guard assist;Rolling walker (2 wheels)       Vision Baseline Vision/History: 1 Wears glasses Ability to See in Adequate Light: 4 Severely impaired       Perception         Praxis         Pertinent Vitals/Pain Pain Assessment Pain Assessment: No/denies pain     Extremity/Trunk Assessment Upper Extremity Assessment Upper Extremity Assessment: Generalized weakness   Lower Extremity Assessment Lower Extremity Assessment: Defer to PT evaluation   Cervical / Trunk Assessment Cervical / Trunk Assessment: Normal   Communication Communication Communication: No apparent difficulties   Cognition Arousal: Alert Behavior During Therapy: [facility]  for tasks assessed/performed Cognition: No apparent impairments                               Following commands: Intact       Cueing  General Comments          Exercises     Shoulder Instructions      Home Living Family/patient expects to be discharged to:: Private residence Living Arrangements: Spouse/significant other Available Help at  Discharge: Family;Available 24 hours/day Type of Home: House Home Access: Level entry     Home Layout: Two level Alternate Level Stairs-Number of Steps: has stair lift   Bathroom Shower/Tub: Chief Strategy Officer: Standard     Home Equipment: Shower seat;Cane - single point;Cane - quad   Additional Comments: is aware of tub bench, not interested per pt's husband      Prior Functioning/Environment Prior Level of Function : Needs assist             Mobility Comments: Mod I, uses SPC ADLs Comments: Ind with bathing, dressing, toileting, cooking, does not drive, husband assists with getting in and out of tub, with driving, and getting groceries. Has staff come to clean    OT Problem List: Decreased activity tolerance;Impaired balance (sitting and/or standing)   OT Treatment/Interventions: Self-care/ADL training;Therapeutic exercise;Therapeutic activities;Patient/family education      OT Goals(Current goals can be found in the care plan section)   Acute Rehab OT Goals Patient Stated Goal: go home OT Goal Formulation: With patient/family Time For Goal Achievement: 10/15/23 Potential to Achieve Goals: Good ADL Goals Pt Will Perform Grooming: with supervision;with set-up;standing;with caregiver independent in assisting Pt Will Perform Upper Body Bathing: with modified independence Pt Will Perform Lower Body Bathing: with supervision;with caregiver independent in assisting;sit to/from stand Pt Will Perform Upper Body Dressing: with modified independence Pt Will Perform Lower Body Dressing: with supervision;with caregiver independent in assisting;sit to/from stand Pt Will Transfer to Toilet: with supervision;with modified independence;ambulating Pt Will Perform Toileting - Clothing Manipulation and hygiene: with supervision;with modified independence;sitting/lateral leans;sit to/from stand;with caregiver independent in assisting Pt Will Perform Tub/Shower  Transfer: with contact guard assist;with supervision;ambulating;grab bars;with caregiver independent in assisting   OT Frequency:  Min 2X/week    Co-evaluation              AM-PAC OT "6 Clicks" Daily Activity     Outcome Measure Help from another person eating meals?: None Help from another person taking care of personal grooming?: A Little Help from another person toileting, which includes using toliet, bedpan, or urinal?: A Little Help from another person bathing (including washing, rinsing, drying)?: A Little Help from another person to put on and taking off regular upper body clothing?: A Little Help from another person to put on and taking off regular lower body clothing?: A Little 6 Click Score: 19   End of Session Equipment Utilized During Treatment: Gait belt  Activity Tolerance: Patient tolerated treatment well Patient left: in bed;with call bell/phone within reach;with family/visitor present  OT Visit Diagnosis: Unsteadiness on feet (R26.81);Muscle weakness (generalized) (M62.81)                Time: 9811-9147 OT Time Calculation (min): 25 min Charges:  OT General Charges $OT Visit: 1 Visit OT Evaluation $OT Eval Low Complexity: 1 Low OT Treatments $Therapeutic Activity: 8-22 mins    Alfred Ann 10/01/2023, 2:13 PM

## 2023-10-01 NOTE — Telephone Encounter (Signed)
 Patient is getting discharged today.  Can you please help them arrange a BMET to be completed 1 week from today? Thanks.

## 2023-10-02 ENCOUNTER — Other Ambulatory Visit (HOSPITAL_COMMUNITY): Payer: Self-pay

## 2023-10-03 ENCOUNTER — Other Ambulatory Visit: Payer: Self-pay | Admitting: Physician Assistant

## 2023-10-03 ENCOUNTER — Encounter: Payer: Self-pay | Admitting: Cardiology

## 2023-10-03 DIAGNOSIS — I5033 Acute on chronic diastolic (congestive) heart failure: Secondary | ICD-10-CM

## 2023-10-03 DIAGNOSIS — Z79899 Other long term (current) drug therapy: Secondary | ICD-10-CM

## 2023-10-03 DIAGNOSIS — E871 Hypo-osmolality and hyponatremia: Secondary | ICD-10-CM

## 2023-10-03 NOTE — Telephone Encounter (Signed)
 Reviewed on: "Last read by Alethia Andrea at 1:19PM on 10/03/2023."

## 2023-10-05 NOTE — Telephone Encounter (Signed)
 Agree with Pam's response.  I will see her on 5/19.  No further action needed.  Thanks MJP

## 2023-10-08 DIAGNOSIS — Z79899 Other long term (current) drug therapy: Secondary | ICD-10-CM | POA: Diagnosis not present

## 2023-10-08 DIAGNOSIS — I5033 Acute on chronic diastolic (congestive) heart failure: Secondary | ICD-10-CM | POA: Diagnosis not present

## 2023-10-08 DIAGNOSIS — E871 Hypo-osmolality and hyponatremia: Secondary | ICD-10-CM | POA: Diagnosis not present

## 2023-10-09 ENCOUNTER — Other Ambulatory Visit (HOSPITAL_COMMUNITY): Payer: Self-pay

## 2023-10-09 LAB — CBC WITH DIFFERENTIAL/PLATELET
Basophils Absolute: 0 10*3/uL (ref 0.0–0.2)
Basos: 0 %
EOS (ABSOLUTE): 0.3 10*3/uL (ref 0.0–0.4)
Eos: 3 %
Hematocrit: 28.5 % — ABNORMAL LOW (ref 34.0–46.6)
Hemoglobin: 9.2 g/dL — ABNORMAL LOW (ref 11.1–15.9)
Immature Grans (Abs): 0 10*3/uL (ref 0.0–0.1)
Immature Granulocytes: 0 %
Lymphocytes Absolute: 1.6 10*3/uL (ref 0.7–3.1)
Lymphs: 15 %
MCH: 30.6 pg (ref 26.6–33.0)
MCHC: 32.3 g/dL (ref 31.5–35.7)
MCV: 95 fL (ref 79–97)
Monocytes Absolute: 0.6 10*3/uL (ref 0.1–0.9)
Monocytes: 6 %
Neutrophils Absolute: 8 10*3/uL — ABNORMAL HIGH (ref 1.4–7.0)
Neutrophils: 76 %
Platelets: 221 10*3/uL (ref 150–450)
RBC: 3.01 x10E6/uL — ABNORMAL LOW (ref 3.77–5.28)
RDW: 12.2 % (ref 11.7–15.4)
WBC: 10.6 10*3/uL (ref 3.4–10.8)

## 2023-10-09 LAB — COMPREHENSIVE METABOLIC PANEL WITH GFR
ALT: 14 IU/L (ref 0–32)
AST: 21 IU/L (ref 0–40)
Albumin: 4.1 g/dL (ref 3.8–4.8)
Alkaline Phosphatase: 72 IU/L (ref 44–121)
BUN/Creatinine Ratio: 28 (ref 12–28)
BUN: 42 mg/dL — ABNORMAL HIGH (ref 8–27)
Bilirubin Total: 0.2 mg/dL (ref 0.0–1.2)
CO2: 20 mmol/L (ref 20–29)
Calcium: 9.9 mg/dL (ref 8.7–10.3)
Chloride: 104 mmol/L (ref 96–106)
Creatinine, Ser: 1.51 mg/dL — ABNORMAL HIGH (ref 0.57–1.00)
Globulin, Total: 2.7 g/dL (ref 1.5–4.5)
Glucose: 215 mg/dL — ABNORMAL HIGH (ref 70–99)
Potassium: 4.8 mmol/L (ref 3.5–5.2)
Sodium: 143 mmol/L (ref 134–144)
Total Protein: 6.8 g/dL (ref 6.0–8.5)
eGFR: 37 mL/min/{1.73_m2} — ABNORMAL LOW (ref >59)

## 2023-10-09 LAB — LACTIC ACID, PLASMA: Lactate, Ven: 19.7 mg/dL (ref 4.8–25.7)

## 2023-10-09 MED ORDER — LATANOPROST 0.005 % OP SOLN
1.0000 [drp] | Freq: Every day | OPHTHALMIC | 4 refills | Status: DC
  Filled 2023-10-09: qty 2.5, 25d supply, fill #0
  Filled 2023-10-29: qty 2.5, 25d supply, fill #1

## 2023-10-09 MED ORDER — DORZOLAMIDE HCL 2 % OP SOLN
1.0000 [drp] | Freq: Two times a day (BID) | OPHTHALMIC | 4 refills | Status: DC
Start: 2023-08-31 — End: 2024-01-15
  Filled 2023-10-09 – 2023-10-12 (×3): qty 10, 100d supply, fill #0

## 2023-10-10 ENCOUNTER — Other Ambulatory Visit: Payer: Self-pay

## 2023-10-10 ENCOUNTER — Ambulatory Visit: Payer: Self-pay | Admitting: Physician Assistant

## 2023-10-10 NOTE — Progress Notes (Signed)
 Noted. Thank you for your excellent follow up.  Thanks MJP

## 2023-10-11 ENCOUNTER — Ambulatory Visit (HOSPITAL_COMMUNITY)
Admission: RE | Admit: 2023-10-11 | Discharge: 2023-10-11 | Disposition: A | Discharge status: Home / Self Care | Source: Ambulatory Visit | Attending: Cardiovascular Disease | Admitting: Cardiovascular Disease

## 2023-10-11 ENCOUNTER — Ambulatory Visit: Payer: Self-pay | Admitting: Physician Assistant

## 2023-10-11 DIAGNOSIS — Z952 Presence of prosthetic heart valve: Secondary | ICD-10-CM | POA: Diagnosis not present

## 2023-10-11 LAB — ECHOCARDIOGRAM COMPLETE
AR max vel: 2.01 cm2
AV Area VTI: 2.14 cm2
AV Area mean vel: 2.06 cm2
AV Mean grad: 9 mmHg
AV Peak grad: 13.4 mmHg
Ao pk vel: 1.83 m/s
Area-P 1/2: 3.48 cm2
S' Lateral: 3 cm

## 2023-10-12 ENCOUNTER — Other Ambulatory Visit (HOSPITAL_COMMUNITY): Payer: Self-pay

## 2023-10-12 ENCOUNTER — Other Ambulatory Visit: Payer: Self-pay

## 2023-10-15 ENCOUNTER — Other Ambulatory Visit (HOSPITAL_COMMUNITY)

## 2023-10-15 ENCOUNTER — Encounter: Payer: Self-pay | Admitting: Cardiology

## 2023-10-15 ENCOUNTER — Other Ambulatory Visit: Payer: Self-pay

## 2023-10-15 ENCOUNTER — Other Ambulatory Visit (HOSPITAL_COMMUNITY): Payer: Self-pay

## 2023-10-15 ENCOUNTER — Ambulatory Visit: Attending: Cardiology | Admitting: Cardiology

## 2023-10-15 VITALS — BP 120/62 | HR 79 | Resp 16 | Ht 62.0 in | Wt 185.4 lb

## 2023-10-15 DIAGNOSIS — I5032 Chronic diastolic (congestive) heart failure: Secondary | ICD-10-CM | POA: Diagnosis not present

## 2023-10-15 DIAGNOSIS — E782 Mixed hyperlipidemia: Secondary | ICD-10-CM | POA: Diagnosis not present

## 2023-10-15 DIAGNOSIS — I2510 Atherosclerotic heart disease of native coronary artery without angina pectoris: Secondary | ICD-10-CM | POA: Diagnosis not present

## 2023-10-15 MED ORDER — TORSEMIDE 20 MG PO TABS
40.0000 mg | ORAL_TABLET | Freq: Every day | ORAL | 3 refills | Status: AC
  Filled 2023-10-15: qty 180, 90d supply, fill #0
  Filled 2024-01-10: qty 180, 90d supply, fill #1
  Filled 2024-04-08 (×2): qty 180, 90d supply, fill #2
  Filled 2024-07-03: qty 180, 90d supply, fill #3

## 2023-10-15 MED ORDER — DAPAGLIFLOZIN PROPANEDIOL 10 MG PO TABS
10.0000 mg | ORAL_TABLET | Freq: Every day | ORAL | 3 refills | Status: AC
  Filled 2023-10-15: qty 90, 90d supply, fill #0
  Filled 2024-01-07: qty 90, 90d supply, fill #1
  Filled 2024-04-06: qty 90, 90d supply, fill #2

## 2023-10-15 MED ORDER — DAPAGLIFLOZIN PROPANEDIOL 10 MG PO TABS: 10.0000 mg | ORAL_TABLET | Freq: Every day | ORAL | Status: DC

## 2023-10-15 NOTE — Progress Notes (Addendum)
 Cardiology Office Note:  .   Date:  10/15/2023  ID:  Danielle Beck, DOB 02-Mar-1951, MRN [id number] PCP: Danielle Grounds, DO  Danielle Beck Cardiologist:  Danielle Ivy, MD PCP: Danielle Grounds, DO  Chief Complaint  Patient presents with   Chronic heart failure with preserved ejection fraction   Hypertension   Follow-up      History of Present Illness: .    Danielle Beck is a 73 y.o. female with hypertension, diabetes mellitus, HFpEF, paroxysmal Afib/flutter, PSVT, s/p TAVR Danielle Beck 26 mm) 08/2023  I last saw the patient on 09/28/2023.  Given her worsening exertional dyspnea symptoms and concern for acute decompensated HFpEF, I had electively admitted her.  She underwent IV diuresis with improvement in her symptoms.  Reviewed recent labs that showed improvement in her sodium and stabilization of her renal function.  Since her discharge, she also underwent follow-up echocardiogram for her TAVR that is reassuring, details below.  Patient has been doing very well since hospital discharge.  Weight has been steady around 181 pounds at home without additional clothes  She is currently taking torsemide  60 mg daily.  She has not noticed any negative, orthopnea, PND symptoms.   Vitals:   10/15/23 1441  BP: 120/62  Pulse: 79  Resp: 16  SpO2: 95%        ROS:  Review of Systems  Cardiovascular:  Negative for chest pain, dyspnea on exertion, leg swelling, orthopnea, palpitations, paroxysmal nocturnal dyspnea and syncope.     Studies Reviewed: Danielle Beck       EKG 09/28/2023: Sinus rhythm with 1st degree A-V block Left bundle branch block When compared with ECG of 24-Sep-2023 11:12, No significant change was found    Echocardiogram 10/11/2023: 1. Left ventricular ejection fraction, by estimation, is 60 to 65%. Left  ventricular ejection fraction by 3D volume is 59 %. The left ventricle has  normal function. The left ventricle has no regional wall motion   abnormalities. Left ventricular diastolic   parameters are consistent with Grade I diastolic dysfunction (impaired  relaxation). The average left ventricular global longitudinal strain is  -16.1 %. The global longitudinal strain is abnormal.   2. Right ventricular systolic function is normal. The right ventricular  size is normal. There is normal pulmonary artery systolic pressure. The  estimated right ventricular systolic pressure is 31.3 mmHg.   3. The mitral valve is degenerative. Mild mitral valve regurgitation. No  evidence of mitral stenosis. Moderate mitral annular calcification.   4. Bioprosthetic aortic valve s/p TAVR with 26 mm Edwards Sapien THV.  Mean gradient 7 mmHg with EOA 2.14 cm^2. No perivalvular leakage.   5. The inferior vena cava is normal in size with greater than 50%  respiratory variability, suggesting right atrial pressure of 3 mmHg.    TAVR note 09/18/2023: Edwards Sapien 3 THV (size 26 mm)  Coronary angiography 08/03/2023: LM: Normal LAD: Mild calcification          Prox-mid 30% disease Lcx: Large vessel        50% mid stenosis between ectatic segment on either side RCA: Mild calcification with mild diffuse disease     Risk Assessment/Calculations:    CHA2DS2-VASc Score = 5   This indicates a 7.2% annual risk of stroke. The patient's score is based upon: CHF History: 1 HTN History: 1 Diabetes History: 1 Stroke History: 0 Vascular Disease History: 0 Age Score: 1 Gender Score: 1    Physical Exam:   Physical Exam  Vitals and nursing note reviewed.  Constitutional:      General: She is not in acute distress. Neck:     Vascular: JVD present.  Cardiovascular:     Rate and Rhythm: Normal rate and regular rhythm.     Heart sounds: Murmur heard.     Harsh midsystolic murmur is present with a grade of 1/6 at the upper right sternal border radiating to the neck.  Pulmonary:     Effort: Pulmonary effort is normal.     Breath sounds: Examination of  the right-lower field reveals decreased breath sounds. Examination of the left-lower field reveals decreased breath sounds. Decreased breath sounds present. No wheezing or rales.  Musculoskeletal:     Right lower leg: No edema.     Left lower leg: No edema.      VISIT DIAGNOSES:   ICD-10-CM   1. Chronic heart failure with preserved ejection fraction (HCC)  I50.32 Basic metabolic panel with GFR    Pro b natriuretic peptide (BNP)    Pro b natriuretic peptide (BNP)    Basic metabolic panel with GFR    2. Coronary artery disease involving native coronary artery of native heart without angina pectoris  I25.10 Lipid panel    Lipid panel    3. Mixed hyperlipidemia  E78.2 Lipid panel    Lipid panel         ASSESSMENT AND PLAN: .    Danielle Beck is a 73 y.o. female with hypertension, diabetes mellitus, HFpEF, paroxysmal Afib/flutter, PSVT, s/p TAVR Danielle Beck 26 mm) 08/2023  HFpEF: Euvolemic today. NYHA class I symptoms. Patient had previously been on Farxiga , which was discontinued just prior to the TAVR. Resume Farxiga  10 mg daily. With resumption of Farxiga , reduce torsemide  from 60 mg daily to 40 mg daily, with additional 20 mg daily as needed for leg edema and shortness of breath symptoms. Check BMP, proBNP in 1 week.  Severe aortic stenosis: Now resolved  s/p TAVR.  No paravalvular leak  Echocardiogram 10/11/2023 reassuring.  CAD: Moderate, nonobstructive except severe ostial diagonal stenosis, recommend medical management for this. Continue statin.  Not on aspirin  due to ongoing use of anticoagulation Check lipid panel.  If LDL >70, will increase Lipitor from 20 mg daily to 40 mg daily.  Paroxysmal Afib/flutter + PSVT: Currently in sinus rhythm.   Continue Xarelto  15 mg daily, given GFR <50. Stable normocytic anemia.   Hypertension: Controlled  Mixed hyperlipidemia: As above.  Signed, Cody Das, MD

## 2023-10-15 NOTE — Patient Instructions (Addendum)
 Medication Instructions:  START Farxiga  10 mg  DECREASED Torsemide  to 40 mg   *If you need a refill on your cardiac medications before your next appointment, please call your pharmacy*  Lab Work IN 1 WEEK: BMP PROBNP LIPID PANEL  If you have labs (blood work) drawn today and your tests are completely normal, you will receive your results only by: MyChart Message (if you have MyChart) OR A paper copy in the mail If you have any lab test that is abnormal or we need to change your treatment, we will call you to review the results.  Follow-Up: At [facility], you and your health needs are our priority.  As part of our continuing mission to provide you with exceptional heart care, our providers are all part of one team.  This team includes your primary Cardiologist (physician) and Advanced Practice Providers or APPs (Physician Assistants and Nurse Practitioners) who all work together to provide you with the care you need, when you need it.  Your next appointment:   EARLY 12/2023  Provider:   Cody Das, MD

## 2023-10-16 ENCOUNTER — Other Ambulatory Visit (HOSPITAL_COMMUNITY): Payer: Self-pay

## 2023-10-23 DIAGNOSIS — E782 Mixed hyperlipidemia: Secondary | ICD-10-CM | POA: Diagnosis not present

## 2023-10-23 DIAGNOSIS — I5032 Chronic diastolic (congestive) heart failure: Secondary | ICD-10-CM | POA: Diagnosis not present

## 2023-10-23 DIAGNOSIS — I2510 Atherosclerotic heart disease of native coronary artery without angina pectoris: Secondary | ICD-10-CM | POA: Diagnosis not present

## 2023-10-24 ENCOUNTER — Other Ambulatory Visit (HOSPITAL_COMMUNITY): Payer: Self-pay

## 2023-10-24 ENCOUNTER — Ambulatory Visit: Payer: Self-pay | Admitting: Cardiology

## 2023-10-24 ENCOUNTER — Other Ambulatory Visit: Payer: Self-pay

## 2023-10-24 DIAGNOSIS — E782 Mixed hyperlipidemia: Secondary | ICD-10-CM | POA: Diagnosis not present

## 2023-10-24 DIAGNOSIS — Z952 Presence of prosthetic heart valve: Secondary | ICD-10-CM | POA: Diagnosis not present

## 2023-10-24 DIAGNOSIS — E871 Hypo-osmolality and hyponatremia: Secondary | ICD-10-CM | POA: Diagnosis not present

## 2023-10-24 DIAGNOSIS — I5032 Chronic diastolic (congestive) heart failure: Secondary | ICD-10-CM | POA: Diagnosis not present

## 2023-10-24 DIAGNOSIS — Z09 Encounter for follow-up examination after completed treatment for conditions other than malignant neoplasm: Secondary | ICD-10-CM | POA: Diagnosis not present

## 2023-10-24 LAB — BASIC METABOLIC PANEL WITH GFR
BUN/Creatinine Ratio: 27 (ref 12–28)
BUN: 46 mg/dL — ABNORMAL HIGH (ref 8–27)
CO2: 19 mmol/L — ABNORMAL LOW (ref 20–29)
Calcium: 9.8 mg/dL (ref 8.7–10.3)
Chloride: 103 mmol/L (ref 96–106)
Creatinine, Ser: 1.71 mg/dL — ABNORMAL HIGH (ref 0.57–1.00)
Glucose: 123 mg/dL — ABNORMAL HIGH (ref 70–99)
Potassium: 5.2 mmol/L (ref 3.5–5.2)
Sodium: 140 mmol/L (ref 134–144)
eGFR: 31 mL/min/{1.73_m2} — ABNORMAL LOW (ref >59)

## 2023-10-24 LAB — LIPID PANEL
Chol/HDL Ratio: 3 ratio (ref 0.0–4.4)
Cholesterol, Total: 186 mg/dL (ref 100–199)
HDL: 61 mg/dL (ref >39)
LDL Chol Calc (NIH): 98 mg/dL (ref 0–99)
Triglycerides: 158 mg/dL — ABNORMAL HIGH (ref 0–149)
VLDL Cholesterol Cal: 27 mg/dL (ref 5–40)

## 2023-10-24 LAB — PRO B NATRIURETIC PEPTIDE: NT-Pro BNP: 524 pg/mL — ABNORMAL HIGH (ref 0–301)

## 2023-10-24 MED ORDER — ATORVASTATIN CALCIUM 40 MG PO TABS
40.0000 mg | ORAL_TABLET | Freq: Every day | ORAL | 3 refills | Status: AC
  Filled 2023-10-24: qty 100, 100d supply, fill #0
  Filled 2023-12-28 – 2024-01-25 (×3): qty 100, 100d supply, fill #1
  Filled 2024-05-02: qty 100, 100d supply, fill #2

## 2023-10-24 NOTE — Progress Notes (Signed)
 I sent a MyChart message about labs, including lipid panel.  If patient agreeable, please increase Lipitor from 20 to 40 mg daily, and check lipid panel in 12/2022 before next visit.  Thanks MJP

## 2023-10-24 NOTE — Telephone Encounter (Signed)
 I would not look too much into mildly elevated triglycerides, especially if done nonfasting.  I do not think we need to add any medication for the triglycerides alone.  Increasing statin dose could also reduce triglycerides.  Thanks MJP

## 2023-10-25 ENCOUNTER — Telehealth (HOSPITAL_COMMUNITY): Payer: Self-pay

## 2023-10-25 NOTE — Telephone Encounter (Signed)
 Pt's husband stated that they are not interested in the cardiac rehab at this time.   Closed referral.

## 2023-10-30 ENCOUNTER — Other Ambulatory Visit (HOSPITAL_COMMUNITY): Payer: Self-pay

## 2023-10-30 DIAGNOSIS — E113511 Type 2 diabetes mellitus with proliferative diabetic retinopathy with macular edema, right eye: Secondary | ICD-10-CM | POA: Diagnosis not present

## 2023-11-12 ENCOUNTER — Other Ambulatory Visit: Payer: Self-pay | Admitting: Family Medicine

## 2023-11-12 DIAGNOSIS — Z1231 Encounter for screening mammogram for malignant neoplasm of breast: Secondary | ICD-10-CM

## 2023-11-14 ENCOUNTER — Encounter

## 2023-11-22 ENCOUNTER — Other Ambulatory Visit: Payer: Self-pay

## 2023-11-22 ENCOUNTER — Other Ambulatory Visit (HOSPITAL_COMMUNITY): Payer: Self-pay

## 2023-11-22 DIAGNOSIS — E113512 Type 2 diabetes mellitus with proliferative diabetic retinopathy with macular edema, left eye: Secondary | ICD-10-CM | POA: Diagnosis not present

## 2023-11-24 ENCOUNTER — Encounter: Payer: Self-pay | Admitting: Cardiology

## 2023-11-24 DIAGNOSIS — I480 Paroxysmal atrial fibrillation: Secondary | ICD-10-CM

## 2023-11-26 ENCOUNTER — Encounter: Payer: Self-pay | Admitting: Pharmacist

## 2023-11-26 ENCOUNTER — Encounter (HOSPITAL_COMMUNITY): Payer: Self-pay

## 2023-11-26 ENCOUNTER — Other Ambulatory Visit: Payer: Self-pay

## 2023-11-26 ENCOUNTER — Other Ambulatory Visit (HOSPITAL_COMMUNITY): Payer: Self-pay

## 2023-11-26 MED ORDER — RIVAROXABAN 15 MG PO TABS
15.0000 mg | ORAL_TABLET | Freq: Every day | ORAL | 3 refills | Status: AC
  Filled 2023-11-26 – 2023-11-27 (×2): qty 90, 90d supply, fill #0
  Filled 2024-02-20: qty 90, 90d supply, fill #1
  Filled 2024-05-20 – 2024-05-27 (×2): qty 90, 90d supply, fill #2

## 2023-11-27 ENCOUNTER — Other Ambulatory Visit (HOSPITAL_COMMUNITY): Payer: Self-pay

## 2023-11-27 ENCOUNTER — Other Ambulatory Visit: Payer: Self-pay

## 2023-12-04 ENCOUNTER — Other Ambulatory Visit (HOSPITAL_COMMUNITY): Payer: Self-pay

## 2023-12-04 ENCOUNTER — Other Ambulatory Visit: Payer: Self-pay

## 2023-12-07 DIAGNOSIS — N1832 Chronic kidney disease, stage 3b: Secondary | ICD-10-CM | POA: Diagnosis not present

## 2023-12-18 DIAGNOSIS — E1122 Type 2 diabetes mellitus with diabetic chronic kidney disease: Secondary | ICD-10-CM | POA: Diagnosis not present

## 2023-12-18 DIAGNOSIS — I509 Heart failure, unspecified: Secondary | ICD-10-CM | POA: Diagnosis not present

## 2023-12-18 DIAGNOSIS — E872 Acidosis, unspecified: Secondary | ICD-10-CM | POA: Diagnosis not present

## 2023-12-18 DIAGNOSIS — E559 Vitamin D deficiency, unspecified: Secondary | ICD-10-CM | POA: Diagnosis not present

## 2023-12-18 DIAGNOSIS — I129 Hypertensive chronic kidney disease with stage 1 through stage 4 chronic kidney disease, or unspecified chronic kidney disease: Secondary | ICD-10-CM | POA: Diagnosis not present

## 2023-12-18 DIAGNOSIS — R809 Proteinuria, unspecified: Secondary | ICD-10-CM | POA: Diagnosis not present

## 2023-12-18 DIAGNOSIS — D631 Anemia in chronic kidney disease: Secondary | ICD-10-CM | POA: Diagnosis not present

## 2023-12-18 DIAGNOSIS — N1832 Chronic kidney disease, stage 3b: Secondary | ICD-10-CM | POA: Diagnosis not present

## 2023-12-19 ENCOUNTER — Ambulatory Visit
Admission: RE | Admit: 2023-12-19 | Discharge: 2023-12-19 | Disposition: A | Discharge status: Home / Self Care | Source: Ambulatory Visit | Attending: Family Medicine | Admitting: Family Medicine

## 2023-12-19 DIAGNOSIS — Z1231 Encounter for screening mammogram for malignant neoplasm of breast: Secondary | ICD-10-CM

## 2023-12-24 ENCOUNTER — Other Ambulatory Visit (HOSPITAL_COMMUNITY): Payer: Self-pay

## 2023-12-28 ENCOUNTER — Other Ambulatory Visit: Payer: Self-pay

## 2023-12-28 ENCOUNTER — Other Ambulatory Visit (HOSPITAL_COMMUNITY): Payer: Self-pay

## 2023-12-28 MED ORDER — OFLOXACIN 0.3 % OP SOLN
1.0000 [drp] | Freq: Three times a day (TID) | OPHTHALMIC | 4 refills | Status: AC
  Filled 2023-12-28: qty 5, 33d supply, fill #0
  Filled 2024-01-23: qty 5, 33d supply, fill #1
  Filled 2024-02-25 – 2024-02-26 (×2): qty 5, 33d supply, fill #2
  Filled 2024-03-29 – 2024-05-27 (×2): qty 5, 33d supply, fill #3

## 2023-12-28 MED ORDER — DORZOLAMIDE HCL 2 % OP SOLN
1.0000 [drp] | Freq: Two times a day (BID) | OPHTHALMIC | 4 refills | Status: AC
  Filled 2023-12-28: qty 10, 100d supply, fill #0
  Filled 2024-04-01: qty 10, 100d supply, fill #1

## 2023-12-28 MED ORDER — LATANOPROST 0.005 % OP SOLN
1.0000 [drp] | Freq: Every day | OPHTHALMIC | 4 refills | Status: AC
  Filled 2023-12-28: qty 10, 100d supply, fill #0
  Filled 2023-12-28: qty 2.5, 25d supply, fill #0
  Filled 2024-03-30: qty 2.5, 25d supply, fill #1
  Filled 2024-04-01: qty 2.5, 50d supply, fill #1

## 2023-12-28 MED ORDER — TIMOLOL MALEATE 0.5 % OP SOLN
1.0000 [drp] | Freq: Two times a day (BID) | OPHTHALMIC | 4 refills | Status: DC
  Filled 2023-12-28: qty 15, 150d supply, fill #0

## 2023-12-29 ENCOUNTER — Other Ambulatory Visit (HOSPITAL_COMMUNITY): Payer: Self-pay

## 2023-12-31 ENCOUNTER — Other Ambulatory Visit: Payer: Self-pay

## 2024-01-01 DIAGNOSIS — E113511 Type 2 diabetes mellitus with proliferative diabetic retinopathy with macular edema, right eye: Secondary | ICD-10-CM | POA: Diagnosis not present

## 2024-01-02 ENCOUNTER — Other Ambulatory Visit: Payer: Self-pay

## 2024-01-03 ENCOUNTER — Other Ambulatory Visit: Payer: Self-pay

## 2024-01-03 ENCOUNTER — Other Ambulatory Visit (HOSPITAL_COMMUNITY): Payer: Self-pay

## 2024-01-03 MED ORDER — LANTUS SOLOSTAR 100 UNIT/ML ~~LOC~~ SOPN
34.0000 [IU] | PEN_INJECTOR | Freq: Every day | SUBCUTANEOUS | 4 refills | Status: AC
  Filled 2024-01-03: qty 45, 132d supply, fill #0
  Filled 2024-04-02 – 2024-04-08 (×2): qty 33, 97d supply, fill #0
  Filled 2024-04-08: qty 45, 100d supply, fill #0

## 2024-01-03 MED ORDER — LANTUS SOLOSTAR 100 UNIT/ML ~~LOC~~ SOPN
34.0000 [IU] | PEN_INJECTOR | Freq: Every day | SUBCUTANEOUS | 4 refills | Status: DC
  Filled 2024-01-03: qty 33, 97d supply, fill #0

## 2024-01-08 DIAGNOSIS — H40053 Ocular hypertension, bilateral: Secondary | ICD-10-CM | POA: Diagnosis not present

## 2024-01-08 DIAGNOSIS — Z961 Presence of intraocular lens: Secondary | ICD-10-CM | POA: Diagnosis not present

## 2024-01-08 DIAGNOSIS — E113313 Type 2 diabetes mellitus with moderate nonproliferative diabetic retinopathy with macular edema, bilateral: Secondary | ICD-10-CM | POA: Diagnosis not present

## 2024-01-08 DIAGNOSIS — Z794 Long term (current) use of insulin: Secondary | ICD-10-CM | POA: Diagnosis not present

## 2024-01-10 ENCOUNTER — Other Ambulatory Visit: Payer: Self-pay

## 2024-01-15 ENCOUNTER — Other Ambulatory Visit (HOSPITAL_COMMUNITY): Payer: Self-pay

## 2024-01-15 ENCOUNTER — Telehealth: Payer: Self-pay

## 2024-01-15 ENCOUNTER — Encounter: Payer: Self-pay | Admitting: Cardiology

## 2024-01-15 ENCOUNTER — Ambulatory Visit: Attending: Cardiology | Admitting: Cardiology

## 2024-01-15 VITALS — BP 108/50 | HR 71 | Ht 62.0 in | Wt 189.0 lb

## 2024-01-15 DIAGNOSIS — I480 Paroxysmal atrial fibrillation: Secondary | ICD-10-CM | POA: Diagnosis not present

## 2024-01-15 DIAGNOSIS — I25118 Atherosclerotic heart disease of native coronary artery with other forms of angina pectoris: Secondary | ICD-10-CM | POA: Diagnosis not present

## 2024-01-15 DIAGNOSIS — Z952 Presence of prosthetic heart valve: Secondary | ICD-10-CM

## 2024-01-15 DIAGNOSIS — E782 Mixed hyperlipidemia: Secondary | ICD-10-CM

## 2024-01-15 DIAGNOSIS — I5032 Chronic diastolic (congestive) heart failure: Secondary | ICD-10-CM

## 2024-01-15 MED ORDER — RIVAROXABAN 15 MG PO TABS: 15.0000 mg | ORAL_TABLET | Freq: Every day | ORAL | Status: AC

## 2024-01-15 NOTE — Telephone Encounter (Signed)
 After OV with Dr. Elmira, on 01/15/24, 2 Bottles of Xarelto  15 mg tablets (each bottle containing 7 tablets) was given to patient, as SAMPLES. Okay, per Chris Pavero, RPH.

## 2024-01-15 NOTE — Telephone Encounter (Signed)
 Medication name/dosage: Samples List: Xarelto  15 mg  Administration instructions: take one tablet by mouth daily   Reason for samples: Reason for samples: unable to afford medication  Ordering provider: Dr. Elmira  *Once above information entered, route the phone encounter to CV DIV MAG ST SAMPLES and send Teams message to team member assigned to Samples for the day.

## 2024-01-15 NOTE — Progress Notes (Signed)
 Cardiology Office Note:  .   Date:  01/15/2024  ID:  Jalaine MARLA Hill, DOB [birth year], MRN [id number] PCP: Auston Opal, DO  [facility] Providers Cardiologist:  Newman Lawrence, MD PCP: Auston Opal, DO  Chief Complaint  Patient presents with   Chronic heart failure with preserved ejection fraction      History of Present Illness: .    TENIQUA MARRON is a 73 y.o. female with hypertension, diabetes mellitus, HFpEF, paroxysmal Afib/flutter, PSVT, s/p TAVR Cathey Pill III 26 mm) 08/2023  Patient is here today with her husband.  She is doing very well.  Denies any significant shortness of breath symptoms, leg edema has resolved on torsemide  40 mg daily with occasional additional 20 mg as needed.  Before her TAVR, she could not climb even 1 or 2 steps without shortness of breath.  Now she is able to climb up a 14 steps without much difficulty.  There were no vitals filed for this visit.       ROS:  Review of Systems  Cardiovascular:  Negative for chest pain, dyspnea on exertion, leg swelling, orthopnea, palpitations, paroxysmal nocturnal dyspnea and syncope.     Studies Reviewed: SABRA       EKG 09/28/2023: Sinus rhythm with 1st degree A-V block Left bundle branch block When compared with ECG of 24-Sep-2023 11:12, No significant change was found    Echocardiogram 10/11/2023: 1. Left ventricular ejection fraction, by estimation, is 60 to 65%. Left  ventricular ejection fraction by 3D volume is 59 %. The left ventricle has  normal function. The left ventricle has no regional wall motion  abnormalities. Left ventricular diastolic   parameters are consistent with Grade I diastolic dysfunction (impaired  relaxation). The average left ventricular global longitudinal strain is  -16.1 %. The global longitudinal strain is abnormal.   2. Right ventricular systolic function is normal. The right ventricular  size is normal. There is normal pulmonary artery systolic  pressure. The  estimated right ventricular systolic pressure is 31.3 mmHg.   3. The mitral valve is degenerative. Mild mitral valve regurgitation. No  evidence of mitral stenosis. Moderate mitral annular calcification.   4. Bioprosthetic aortic valve s/p TAVR with 26 mm Edwards Sapien THV.  Mean gradient 7 mmHg with EOA 2.14 cm^2. No perivalvular leakage.   5. The inferior vena cava is normal in size with greater than 50%  respiratory variability, suggesting right atrial pressure of 3 mmHg.    TAVR note 09/18/2023: Edwards Sapien 3 THV (size 26 mm)  Coronary angiography 08/03/2023: LM: Normal LAD: Mild calcification          Prox-mid 30% disease Lcx: Large vessel        50% mid stenosis between ectatic segment on either side RCA: Mild calcification with mild diffuse disease   Labs 12/07/2023: Hb 10.9 Cr 1.46, eGFR 38. Na/K 136/4.6  09/2023: Chol 186, TG 158, HDL 61, LDL 98 HbA1C 6.5% Hb 8.7 Cr 1.71, eGFR 31, Na/K 140/5.2 ProBNP 523   Risk Assessment/Calculations:    CHA2DS2-VASc Score = 5   This indicates a 7.2% annual risk of stroke. The patient's score is based upon: CHF History: 1 HTN History: 1 Diabetes History: 1 Stroke History: 0 Vascular Disease History: 0 Age Score: 1 Gender Score: 1    Physical Exam:   Physical Exam Vitals and nursing note reviewed.  Constitutional:      General: She is not in acute distress. Neck:     Vascular: JVD present.  Cardiovascular:     Rate and Rhythm: Normal rate and regular rhythm.     Heart sounds: No murmur heard. Pulmonary:     Effort: Pulmonary effort is normal.     Breath sounds: No wheezing or rales.  Musculoskeletal:     Right lower leg: No edema.     Left lower leg: No edema.      VISIT DIAGNOSES:   ICD-10-CM   1. Coronary artery disease of native artery of native heart with stable angina pectoris (HCC)  I25.118     2. PAF (paroxysmal atrial fibrillation) (HCC)  I48.0     3. Mixed hyperlipidemia  E78.2      4. S/P TAVR (transcatheter aortic valve replacement)  Z95.2     5. Chronic heart failure with preserved ejection fraction (HCC)  I50.32           ASSESSMENT AND PLAN: .    TRACE CEDERBERG is a 73 y.o. female with hypertension, diabetes mellitus, HFpEF, paroxysmal Afib/flutter, PSVT, s/p TAVR Cathey Pill III 26 mm) 08/2023  HFpEF: Euvolemic today. NYHA class I symptoms. Continue Farxiga  10 mg daily. Continue torsemide  40 mg daily, with additional 20 mg daily as needed for leg edema and shortness of breath symptoms.  Severe aortic stenosis: Now resolved  s/p TAVR.  No paravalvular leak  Echocardiogram 10/11/2023 reassuring.  CAD: Moderate, nonobstructive except severe ostial diagonal stenosis, recommend medical management for this. Continue statin.  Not on aspirin  due to ongoing use of anticoagulation Continue Lipitor 40 mg daily. Upcoming lipid panel with PCP next month.  Paroxysmal Afib/flutter + PSVT: Currently in sinus rhythm.   Continue Xarelto  15 mg daily, given GFR <50. Stable normocytic anemia.   Hypertension: Controlled  Mixed hyperlipidemia: As above.  F/u 6 months  Signed, Newman JINNY Lawrence, MD

## 2024-01-15 NOTE — Patient Instructions (Addendum)
   Follow-Up: At [facility], you and your health needs are our priority.  As part of our continuing mission to provide you with exceptional heart care, our providers are all part of one team.  This team includes your primary Cardiologist (physician) and Advanced Practice Providers or APPs (Physician Assistants and Nurse Practitioners) who all work together to provide you with the care you need, when you need it.  Your next appointment:   6 month  Provider:   Newman JINNY Lawrence, MD

## 2024-01-17 DIAGNOSIS — I480 Paroxysmal atrial fibrillation: Secondary | ICD-10-CM | POA: Diagnosis not present

## 2024-01-17 DIAGNOSIS — I10 Essential (primary) hypertension: Secondary | ICD-10-CM | POA: Diagnosis not present

## 2024-01-17 DIAGNOSIS — Z0000 Encounter for general adult medical examination without abnormal findings: Secondary | ICD-10-CM | POA: Diagnosis not present

## 2024-01-17 DIAGNOSIS — E782 Mixed hyperlipidemia: Secondary | ICD-10-CM | POA: Diagnosis not present

## 2024-01-17 DIAGNOSIS — N1832 Chronic kidney disease, stage 3b: Secondary | ICD-10-CM | POA: Diagnosis not present

## 2024-01-17 DIAGNOSIS — E118 Type 2 diabetes mellitus with unspecified complications: Secondary | ICD-10-CM | POA: Diagnosis not present

## 2024-01-17 DIAGNOSIS — Z1331 Encounter for screening for depression: Secondary | ICD-10-CM | POA: Diagnosis not present

## 2024-01-17 DIAGNOSIS — M48061 Spinal stenosis, lumbar region without neurogenic claudication: Secondary | ICD-10-CM | POA: Diagnosis not present

## 2024-01-17 DIAGNOSIS — I5032 Chronic diastolic (congestive) heart failure: Secondary | ICD-10-CM | POA: Diagnosis not present

## 2024-01-17 DIAGNOSIS — M8588 Other specified disorders of bone density and structure, other site: Secondary | ICD-10-CM | POA: Diagnosis not present

## 2024-01-17 DIAGNOSIS — I110 Hypertensive heart disease with heart failure: Secondary | ICD-10-CM | POA: Diagnosis not present

## 2024-01-17 DIAGNOSIS — D6869 Other thrombophilia: Secondary | ICD-10-CM | POA: Diagnosis not present

## 2024-01-22 ENCOUNTER — Other Ambulatory Visit (HOSPITAL_COMMUNITY): Payer: Self-pay

## 2024-01-22 MED ORDER — LOSARTAN POTASSIUM 50 MG PO TABS
50.0000 mg | ORAL_TABLET | Freq: Every day | ORAL | 1 refills | Status: AC
  Filled 2024-01-22: qty 100, 100d supply, fill #0
  Filled 2024-05-01: qty 100, 100d supply, fill #1

## 2024-01-22 MED ORDER — AMLODIPINE BESYLATE 10 MG PO TABS
10.0000 mg | ORAL_TABLET | Freq: Every day | ORAL | 1 refills | Status: AC
  Filled 2024-01-22: qty 100, 100d supply, fill #0
  Filled 2024-05-01: qty 100, 100d supply, fill #1

## 2024-01-23 ENCOUNTER — Other Ambulatory Visit (HOSPITAL_COMMUNITY): Payer: Self-pay

## 2024-01-27 ENCOUNTER — Other Ambulatory Visit (HOSPITAL_COMMUNITY): Payer: Self-pay

## 2024-01-29 ENCOUNTER — Other Ambulatory Visit: Payer: Self-pay

## 2024-01-29 ENCOUNTER — Other Ambulatory Visit (HOSPITAL_COMMUNITY): Payer: Self-pay

## 2024-01-29 MED ORDER — LANTUS SOLOSTAR 100 UNIT/ML ~~LOC~~ SOPN
34.0000 [IU] | PEN_INJECTOR | Freq: Every day | SUBCUTANEOUS | 5 refills | Status: AC
  Filled 2024-01-29: qty 9, 26d supply, fill #0

## 2024-01-29 MED ORDER — LANTUS SOLOSTAR 100 UNIT/ML ~~LOC~~ SOPN
34.0000 [IU] | PEN_INJECTOR | Freq: Every day | SUBCUTANEOUS | 5 refills | Status: AC
  Filled 2024-07-03 (×2): qty 30, 88d supply, fill #0
  Filled 2024-07-03: qty 9, 26d supply, fill #0
  Filled 2024-07-03: qty 30, 88d supply, fill #0

## 2024-01-29 MED ORDER — METHOCARBAMOL 500 MG PO TABS
1000.0000 mg | ORAL_TABLET | Freq: Every day | ORAL | 11 refills | Status: AC | PRN
  Filled 2024-01-29 – 2024-04-01 (×2): qty 60, 30d supply, fill #0
  Filled 2024-05-02: qty 60, 30d supply, fill #1
  Filled 2024-07-03 (×2): qty 180, 90d supply, fill #2

## 2024-01-29 MED ORDER — PREGABALIN 75 MG PO CAPS
75.0000 mg | ORAL_CAPSULE | Freq: Two times a day (BID) | ORAL | 5 refills | Status: AC
  Filled 2024-01-29 – 2024-02-04 (×3): qty 60, 30d supply, fill #0
  Filled 2024-03-01 – 2024-03-03 (×2): qty 60, 30d supply, fill #1
  Filled 2024-04-01: qty 60, 30d supply, fill #2
  Filled 2024-05-02: qty 60, 30d supply, fill #3
  Filled 2024-06-02: qty 60, 30d supply, fill #4
  Filled 2024-07-03: qty 60, 30d supply, fill #5

## 2024-01-29 MED ORDER — TRAMADOL HCL 50 MG PO TABS
50.0000 mg | ORAL_TABLET | Freq: Four times a day (QID) | ORAL | 5 refills | Status: AC | PRN
  Filled 2024-01-29: qty 10, 3d supply, fill #0

## 2024-01-29 MED ORDER — PREGABALIN 75 MG PO CAPS: 75.0000 mg | ORAL_CAPSULE | Freq: Two times a day (BID) | ORAL | 5 refills | Status: AC

## 2024-01-29 MED ORDER — METFORMIN HCL 1000 MG PO TABS
1000.0000 mg | ORAL_TABLET | Freq: Two times a day (BID) | ORAL | 1 refills | Status: AC
  Filled 2024-05-02: qty 200, 100d supply, fill #0

## 2024-01-29 MED ORDER — FREESTYLE TEST VI STRP
ORAL_STRIP | 3 refills | Status: AC
  Filled 2024-01-29: qty 200, 90d supply, fill #0

## 2024-01-29 MED ORDER — AMLODIPINE BESYLATE 10 MG PO TABS
10.0000 mg | ORAL_TABLET | Freq: Every day | ORAL | 1 refills | Status: AC
  Filled 2024-01-29: qty 100, 100d supply, fill #0

## 2024-01-29 MED ORDER — AMLODIPINE BESYLATE 10 MG PO TABS: 10.0000 mg | ORAL_TABLET | Freq: Every day | ORAL | 1 refills | Status: AC

## 2024-01-29 MED ORDER — METFORMIN HCL 1000 MG PO TABS
1000.0000 mg | ORAL_TABLET | Freq: Two times a day (BID) | ORAL | 1 refills | Status: AC
  Filled 2024-01-29: qty 200, 100d supply, fill #0

## 2024-01-30 ENCOUNTER — Other Ambulatory Visit (HOSPITAL_COMMUNITY): Payer: Self-pay

## 2024-01-30 ENCOUNTER — Other Ambulatory Visit: Payer: Self-pay

## 2024-02-04 ENCOUNTER — Other Ambulatory Visit (HOSPITAL_COMMUNITY): Payer: Self-pay

## 2024-02-11 ENCOUNTER — Other Ambulatory Visit (HOSPITAL_COMMUNITY): Payer: Self-pay

## 2024-02-11 DIAGNOSIS — H40052 Ocular hypertension, left eye: Secondary | ICD-10-CM | POA: Diagnosis not present

## 2024-02-11 DIAGNOSIS — E113512 Type 2 diabetes mellitus with proliferative diabetic retinopathy with macular edema, left eye: Secondary | ICD-10-CM | POA: Diagnosis not present

## 2024-02-11 MED ORDER — ACETAZOLAMIDE ER 500 MG PO CP12
500.0000 mg | ORAL_CAPSULE | Freq: Every morning | ORAL | 0 refills | Status: AC
  Filled 2024-02-11: qty 50, 50d supply, fill #0

## 2024-02-21 ENCOUNTER — Encounter: Payer: Self-pay | Admitting: Pharmacist

## 2024-02-21 ENCOUNTER — Other Ambulatory Visit: Payer: Self-pay

## 2024-02-21 ENCOUNTER — Other Ambulatory Visit (HOSPITAL_COMMUNITY): Payer: Self-pay

## 2024-02-22 DIAGNOSIS — M8588 Other specified disorders of bone density and structure, other site: Secondary | ICD-10-CM | POA: Diagnosis not present

## 2024-02-25 ENCOUNTER — Other Ambulatory Visit (HOSPITAL_COMMUNITY): Payer: Self-pay

## 2024-02-25 ENCOUNTER — Other Ambulatory Visit: Payer: Self-pay

## 2024-03-01 ENCOUNTER — Other Ambulatory Visit (HOSPITAL_COMMUNITY): Payer: Self-pay

## 2024-03-03 ENCOUNTER — Other Ambulatory Visit: Payer: Self-pay

## 2024-03-03 ENCOUNTER — Other Ambulatory Visit (HOSPITAL_COMMUNITY): Payer: Self-pay

## 2024-03-19 DIAGNOSIS — E113511 Type 2 diabetes mellitus with proliferative diabetic retinopathy with macular edema, right eye: Secondary | ICD-10-CM | POA: Diagnosis not present

## 2024-03-29 ENCOUNTER — Other Ambulatory Visit (HOSPITAL_COMMUNITY): Payer: Self-pay

## 2024-03-31 ENCOUNTER — Other Ambulatory Visit (HOSPITAL_COMMUNITY): Payer: Self-pay

## 2024-04-01 ENCOUNTER — Other Ambulatory Visit (HOSPITAL_BASED_OUTPATIENT_CLINIC_OR_DEPARTMENT_OTHER): Payer: Self-pay

## 2024-04-01 ENCOUNTER — Other Ambulatory Visit (HOSPITAL_COMMUNITY): Payer: Self-pay

## 2024-04-01 MED ORDER — LATANOPROST 0.005 % OP SOLN
1.0000 [drp] | Freq: Every day | OPHTHALMIC | 9 refills | Status: AC
  Filled 2024-04-01: qty 10, 100d supply, fill #0
  Filled 2024-07-01: qty 10, 100d supply, fill #1

## 2024-04-02 ENCOUNTER — Other Ambulatory Visit (HOSPITAL_COMMUNITY): Payer: Self-pay

## 2024-04-03 ENCOUNTER — Other Ambulatory Visit: Payer: Self-pay

## 2024-04-03 ENCOUNTER — Encounter: Payer: Self-pay | Admitting: Pharmacist

## 2024-04-08 ENCOUNTER — Other Ambulatory Visit: Payer: Self-pay

## 2024-04-08 ENCOUNTER — Other Ambulatory Visit (HOSPITAL_COMMUNITY): Payer: Self-pay

## 2024-05-02 ENCOUNTER — Other Ambulatory Visit: Payer: Self-pay

## 2024-05-02 ENCOUNTER — Other Ambulatory Visit (HOSPITAL_COMMUNITY): Payer: Self-pay

## 2024-05-20 ENCOUNTER — Other Ambulatory Visit: Payer: Self-pay

## 2024-05-21 ENCOUNTER — Encounter (HOSPITAL_COMMUNITY): Payer: Self-pay

## 2024-05-21 ENCOUNTER — Other Ambulatory Visit (HOSPITAL_COMMUNITY): Payer: Self-pay

## 2024-05-27 ENCOUNTER — Other Ambulatory Visit: Payer: Self-pay

## 2024-05-27 ENCOUNTER — Other Ambulatory Visit (HOSPITAL_COMMUNITY): Payer: Self-pay

## 2024-06-02 ENCOUNTER — Other Ambulatory Visit (HOSPITAL_COMMUNITY): Payer: Self-pay

## 2024-06-05 ENCOUNTER — Other Ambulatory Visit: Payer: Self-pay

## 2024-06-05 DIAGNOSIS — Z952 Presence of prosthetic heart valve: Secondary | ICD-10-CM

## 2024-06-30 ENCOUNTER — Other Ambulatory Visit: Payer: Self-pay | Admitting: Cardiology

## 2024-06-30 ENCOUNTER — Other Ambulatory Visit: Payer: Self-pay

## 2024-06-30 DIAGNOSIS — I480 Paroxysmal atrial fibrillation: Secondary | ICD-10-CM

## 2024-06-30 MED ORDER — METOPROLOL TARTRATE 25 MG PO TABS
25.0000 mg | ORAL_TABLET | Freq: Two times a day (BID) | ORAL | 1 refills | Status: AC
  Filled 2024-06-30: qty 180, 90d supply, fill #0

## 2024-07-01 ENCOUNTER — Other Ambulatory Visit: Payer: Self-pay

## 2024-07-01 ENCOUNTER — Other Ambulatory Visit (HOSPITAL_COMMUNITY): Payer: Self-pay

## 2024-07-02 ENCOUNTER — Other Ambulatory Visit (HOSPITAL_COMMUNITY): Payer: Self-pay

## 2024-07-02 ENCOUNTER — Other Ambulatory Visit: Payer: Self-pay

## 2024-07-02 MED ORDER — TIMOLOL MALEATE 0.5 % OP SOLN
1.0000 [drp] | Freq: Two times a day (BID) | OPHTHALMIC | 4 refills | Status: AC
  Filled 2024-07-02: qty 10, 100d supply, fill #0

## 2024-07-02 MED ORDER — DORZOLAMIDE HCL 2 % OP SOLN
1.0000 [drp] | Freq: Two times a day (BID) | OPHTHALMIC | 4 refills | Status: AC
  Filled 2024-07-02: qty 10, 100d supply, fill #0

## 2024-07-02 MED ORDER — LATANOPROST 0.005 % OP SOLN: 1.0000 [drp] | Freq: Every day | OPHTHALMIC | 9 refills | Status: AC

## 2024-07-03 ENCOUNTER — Other Ambulatory Visit (HOSPITAL_COMMUNITY): Payer: Self-pay

## 2024-07-03 ENCOUNTER — Other Ambulatory Visit: Payer: Self-pay

## 2024-07-04 ENCOUNTER — Other Ambulatory Visit (HOSPITAL_COMMUNITY): Payer: Self-pay

## 2024-07-04 MED ORDER — AMOXICILLIN-POT CLAVULANATE 500-125 MG PO TABS
1.0000 | ORAL_TABLET | Freq: Two times a day (BID) | ORAL | 0 refills | Status: AC
  Filled 2024-07-04 (×2): qty 14, 7d supply, fill #0

## 2024-07-14 ENCOUNTER — Ambulatory Visit: Admitting: Cardiology

## 2024-08-21 ENCOUNTER — Ambulatory Visit: Admitting: Physician Assistant

## 2024-08-21 ENCOUNTER — Ambulatory Visit (HOSPITAL_COMMUNITY)

## 2024-09-02 ENCOUNTER — Other Ambulatory Visit (HOSPITAL_COMMUNITY)

## 2024-09-04 ENCOUNTER — Ambulatory Visit: Admitting: Physician Assistant
